# Patient Record
Sex: Male | Born: 1970 | Race: Black or African American | Hispanic: No | Marital: Single | Smoking: Never smoker
Health system: Southern US, Community
[De-identification: ages and names within clinical notes are randomized; demographics above are authoritative.]

## PROBLEM LIST (undated history)

## (undated) DIAGNOSIS — I10 Essential (primary) hypertension: Secondary | ICD-10-CM

## (undated) DIAGNOSIS — M1990 Unspecified osteoarthritis, unspecified site: Secondary | ICD-10-CM

## (undated) DIAGNOSIS — G4730 Sleep apnea, unspecified: Secondary | ICD-10-CM

## (undated) HISTORY — DX: Sleep apnea, unspecified: G47.30

## (undated) HISTORY — DX: Unspecified osteoarthritis, unspecified site: M19.90

## (undated) HISTORY — PX: NO PAST SURGERIES: SHX2092

---

## 2010-11-17 ENCOUNTER — Ambulatory Visit: Payer: Self-pay | Admitting: Family Medicine

## 2010-11-17 DIAGNOSIS — M545 Low back pain, unspecified: Secondary | ICD-10-CM | POA: Insufficient documentation

## 2010-11-17 DIAGNOSIS — M6281 Muscle weakness (generalized): Secondary | ICD-10-CM

## 2010-11-25 ENCOUNTER — Encounter
Admission: RE | Admit: 2010-11-25 | Discharge: 2010-11-25 | Payer: Self-pay | Source: Home / Self Care | Attending: Family Medicine | Admitting: Family Medicine

## 2010-12-04 ENCOUNTER — Telehealth: Payer: Self-pay | Admitting: Family Medicine

## 2010-12-13 ENCOUNTER — Encounter
Admission: RE | Admit: 2010-12-13 | Discharge: 2010-12-13 | Payer: Self-pay | Source: Home / Self Care | Attending: Family Medicine | Admitting: Family Medicine

## 2010-12-13 ENCOUNTER — Encounter
Admission: RE | Admit: 2010-12-13 | Discharge: 2011-01-16 | Payer: Self-pay | Source: Home / Self Care | Attending: Family Medicine | Admitting: Family Medicine

## 2011-01-09 ENCOUNTER — Encounter: Admit: 2011-01-09 | Payer: Self-pay | Admitting: Family Medicine

## 2011-01-16 NOTE — Assessment & Plan Note (Signed)
Summary: NOV: LUmbago   Vital Signs:  Patient profile:   40 year old male Height:      69 inches Weight:      159 pounds Pulse rate:   75 / minute BP sitting:   117 / 79  (right arm) Cuff size:   regular  Vitals Entered By: Avon Gully CMA, Duncan Dull) (November 17, 2010 11:34 AM) CC: NP-back pain    CC:  NP-back pain .  History of Present Illness:   Back pain for 6 months. INitially tx for muscle spams and but on IBU and muscle spasm. Did PT for about 1.5 months and not relief. Mother and brother with MS and he is worried.  No meds for pain relief.  Pain is daily. Pain in the low back.  Now legs are feeling sore, no sharp or shooting pain.  NO trauma 6 months ago.  Did have a normal xray.  Pain is keeping him awake.  no sciatic symptoms.  He does say that when his upper thighs feel sore they also feel weak.  He notes it is difficult to go up steps especially by the end of the day.he has not had any weakness in his upper extremities or lower legs.  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  He completed high school.  He is single.  girlfriend's name is Marcelino Duster Never Smoked Alcohol use-yes Drug use-no Regular exercise-no 3 caffeinated drinks per day Smoking Status:  never Does Patient Exercise:  no STD Risk:  never Drug Use:  no Seat Belt Use:  always  Review of Systems       No fever/sweats/weakness, unexplained weight loss/gain.  No vison changes.  No difficulty hearing/ringing in ears, hay fever/allergies.  No chest pain/discomfort, palpitations.  No Br lump/nipple discharge.  No cough/wheeze.  No blood in BM,  nausea/vomiting/diarrhea.  No nighttime urination, leaking urine, unusual vaginal bleeding, discharge (penis or vagina).  + muscle/joint pain. No rash, change in mole.  No HA, memory loss.  No anxiety, +sleep d/o. No depression.  No easy bruising/bleeding, unexplained lump   Physical Exam  General:  Well-developed,well-nourished,in no acute distress; alert,appropriate and cooperative throughout examination Head:  Normocephalic and atraumatic without obvious abnormalities. No apparent alopecia or balding. Msk:  normal flexion-extension and rotation and side bending at the low back.  He is nontender over the lumbar spine.  No SI joint tenderness or paraspinous tenderness.  Negative straight leg raise bilaterally.  Hip knee and ankle strength are 5/5 bilaterally. Extremities:  no lower extremity edema. Neurologic:  alert & oriented X3, strength normal in all extremities, gait normal, and DTRs symmetrical and normal.     Impression & Recommendations:  Problem # 1:  LUMBAGO (ICD-724.2)  I explained to them as far as his low-back pain is concerned that he has had appropriate care.  He started out with anti-inflammatories and muscle relaxers and home exercises.  He did not improve and so did physical therapy for about 6 weeks and still did not improve.  He has had a normal x-ray of the lumbar spine per his  report.  Since his pain has persisted for the last 6 months I do think we should go ahead and schedule an MRI for further evaluation.  He is also to conservative therapy.  The pain is keeping him awake at nights I did prescribe hydrocodone for bedtime use only.  Clearly he is also worried about multiple sclerosis.  I am also concerned he is having some upper thigh weakness.  We discussed the possibility of getting a consult since he does have a strong family history of a mass.  Will start by getting the MRI of his lumbar spine and then I will call him and discuss the results and future plans. His updated  medication list for this problem includes:    Hydrocodone-acetaminophen 5-500 Mg Tabs (Hydrocodone-acetaminophen) .Marland Kitchen... 1-2 tab at bedtime. can repeat dose in 4 hours.  Orders: T-MRI Lumbar spine w/o contrast (44034)  Complete Medication List: 1)  Hydrocodone-acetaminophen 5-500 Mg Tabs (Hydrocodone-acetaminophen) .Marland Kitchen.. 1-2 tab at bedtime. can repeat dose in 4 hours.  Patient Instructions: 1)  We will call you with the appt for your MRI of your low back. 2)  You can you the hydrocodone at bedtime. It will make you sleep.Call if you don't tolerate it.  Prescriptions: HYDROCODONE-ACETAMINOPHEN 5-500 MG TABS (HYDROCODONE-ACETAMINOPHEN) 1-2 tab at bedtime. Can repeat dose in 4 hours.  #60 x 0   Entered and Authorized by:   Nani Gasser MD   Signed by:   Nani Gasser MD on 11/17/2010   Method used:   Print then Give to Patient   RxID:   [id number]    Orders Added: 1)  New Patient Level III [95188] 2)  T-MRI Lumbar spine w/o contrast [41660]

## 2011-01-18 NOTE — Progress Notes (Signed)
Summary: PT referral  Phone Note Call from Patient   Caller: Patient Call For: Nani Gasser MD Summary of Call: pt called and does want to do physical therapy for his back Initial call taken by: Avon Gully CMA, Duncan Dull),  December 04, 2010 9:21 AM

## 2011-01-18 NOTE — Progress Notes (Signed)
Summary: med  Phone Note Call from Patient   Caller: Patient Call For: Nani Gasser MD Summary of Call: Pt called and states he has muscle spasm in his back during the day and the medication rx'ed at the last visit he takes at night for sleeping. Is there anything he can take for muscle spasm during the day [facility] [place] Initial call taken by: Avon Gully CMA, Duncan Dull),  December 04, 2010 9:51 AM  Follow-up for Phone Call        Can send in MR for daytime. Don't know his pharmacy.  Can be used as needed.  Follow-up by: Nani Gasser MD,  December 04, 2010 9:54 AM  Additional Follow-up for Phone Call Additional follow up Details #1::        [facility] [place] Additional Follow-up by: Avon Gully CMA, Duncan Dull),  December 04, 2010 11:02 AM    New/Updated Medications: SKELAXIN 800 MG TABS (METAXALONE) Take 1 tablet by mouth three times a day as needed muscle spasm Prescriptions: SKELAXIN 800 MG TABS (METAXALONE) Take 1 tablet by mouth three times a day as needed muscle spasm  #60 x 0   Entered by:   Avon Gully CMA, (AAMA)   Authorized by:   Nani Gasser MD   Signed by:   Avon Gully CMA, (AAMA) on 12/04/2010   Method used:   Electronically to        [facility] [address] [phone]* (retail)       [address]       [place], [place]  [address]       Ph: [phone] or [phone]       Fax: [phone]   RxID:   [id number]

## 2011-01-19 ENCOUNTER — Ambulatory Visit: Payer: 59 | Attending: Family Medicine | Admitting: Physical Therapy

## 2011-01-19 ENCOUNTER — Ambulatory Visit: Payer: Self-pay | Admitting: Physical Therapy

## 2011-01-19 DIAGNOSIS — M2560 Stiffness of unspecified joint, not elsewhere classified: Secondary | ICD-10-CM | POA: Insufficient documentation

## 2011-01-19 DIAGNOSIS — M545 Low back pain, unspecified: Secondary | ICD-10-CM | POA: Insufficient documentation

## 2011-01-19 DIAGNOSIS — IMO0001 Reserved for inherently not codable concepts without codable children: Secondary | ICD-10-CM | POA: Insufficient documentation

## 2011-01-19 DIAGNOSIS — R5381 Other malaise: Secondary | ICD-10-CM | POA: Insufficient documentation

## 2011-01-23 ENCOUNTER — Ambulatory Visit: Payer: 59 | Admitting: Physical Therapy

## 2011-01-26 ENCOUNTER — Encounter: Payer: 59 | Admitting: Physical Therapy

## 2011-01-30 ENCOUNTER — Ambulatory Visit (INDEPENDENT_AMBULATORY_CARE_PROVIDER_SITE_OTHER): Payer: 59 | Admitting: Family Medicine

## 2011-01-30 ENCOUNTER — Encounter: Payer: Self-pay | Admitting: Family Medicine

## 2011-01-30 DIAGNOSIS — M545 Low back pain: Secondary | ICD-10-CM

## 2011-01-31 ENCOUNTER — Telehealth: Payer: Self-pay | Admitting: Family Medicine

## 2011-02-07 ENCOUNTER — Encounter: Payer: Self-pay | Admitting: Family Medicine

## 2011-02-07 NOTE — Progress Notes (Signed)
Summary: F/U note  Phone Note Outgoing Call   Summary of Call: [facility] that I spoke with PT down the hall and asked they check you for leg length discrepancy since have one more visit. We are also working on the ortho referral.  Initial call taken by: Nani Gasser MD,  January 31, 2011 8:35 AM

## 2011-02-07 NOTE — Assessment & Plan Note (Signed)
Summary: F/U Lumbago.    Vital Signs:  Patient profile:   40 year old male Height:      69 inches Weight:      161 pounds Pulse rate:   77 / minute BP sitting:   149 / 104  (right arm) Cuff size:   regular  Vitals Entered By: Avon Gully CMA, Duncan Dull) (January 30, 2011 8:34 AM) CC: f/u PT, no improvement   CC:  f/u PT and no improvement.  History of Present Illness: Feels like had pain form hips sot low back. No improvement with PT for about 3 months and they told him to f/u.  Pain is still keeping him awake at tnight. Skelaxin was not helping.  He is still working full time.   Current Medications (verified): 1)  Hydrocodone-Acetaminophen 5-500 Mg Tabs (Hydrocodone-Acetaminophen) .Marland Kitchen.. 1-2 Tab At Bedtime. Can Repeat Dose in 4 Hours. 2)  Skelaxin 800 Mg Tabs (Metaxalone) .... Take 1 Tablet By Mouth Three Times A Day As Needed Muscle Spasm  Allergies (verified): No Known Drug Allergies  Comments:  Nurse/Medical Assistant: The patient's medications and allergies were reviewed with the patient and were updated in the Medication and Allergy Lists. Avon Gully CMA, Duncan Dull) (January 30, 2011 8:35 AM)  Physical Exam  General:  Well-developed,well-nourished,in no acute distress; alert,appropriate and cooperative throughout examination Msk:  Some pain in the right low back with flexion. Pain in the mid back with extension.  Normal rotation right and left. NOrmal side bending. Neg straight leg raise. NOn tender over the lumbar spine, SI joints or paraspinous muscles. Knee, hip, and ankles strength is 5/5.  Extremities:  NO LE edema.    Impression & Recommendations:  Problem # 1:  LUMBAGO (ICD-724.2) No improvement with PT so will refer to ortho.  Normal MRI results. IF lthey do not feel this is MSK then consider referral to Neuro. Trial of neurontin at bedtime to help. Pt ot let me know if helping. Felt the skelexin was not helping. Pain is still keeping him awake at tnight.    The following medications were removed from the medication list:    Skelaxin 800 Mg Tabs (Metaxalone) .Marland Kitchen... Take 1 tablet by mouth three times a day as needed muscle spasm His updated medication list for this problem includes:    Hydrocodone-acetaminophen 5-500 Mg Tabs (Hydrocodone-acetaminophen) .Marland Kitchen... 1-2 tab at bedtime. can repeat dose in 4 hours.  I explained to them as far as his low-back pain is concerned that he has had appropriate care.  He started out with anti-inflammatories and muscle relaxers and home exercises.  He did not improve and so did physical therapy for about 6 weeks and still did not improve.  He has had a normal x-ray of the lumbar spine per his report.  Since his pain has persisted for the last 6 months I do think we should go ahead and schedule an MRI for further evaluation.  He is also to conservative therapy.  The pain is keeping him awake at nights I did prescribe hydrocodone for bedtime use only.  Clearly he is also worried about multiple sclerosis.  I am also concerned he is having some upper thigh weakness.  We discussed the possibility of getting a consult since he does have a strong family history of a mass.  Will start by getting the MRI of his lumbar spine and then I will call him and discuss the results and future plans. His updated medication list for this problem includes:  Hydrocodone-acetaminophen 5-500 Mg Tabs (Hydrocodone-acetaminophen) .Marland Kitchen... 1-2 tab at bedtime. can repeat dose in 4 hours.  Orders: T-MRI Lumbar spine w/o contrast (16109)  Orders: Orthopedic Referral (Ortho)  Complete Medication List: 1)  Hydrocodone-acetaminophen 5-500 Mg Tabs (Hydrocodone-acetaminophen) .Marland Kitchen.. 1-2 tab at bedtime. can repeat dose in 4 hours. 2)  Gabapentin 100 Mg Caps (Gabapentin) .Marland Kitchen.. 1-2 tabs by mouth about one hour before bedtime.  Patient Instructions: 1)  We will call you with the ortho referral.  Prescriptions: GABAPENTIN 100 MG CAPS (GABAPENTIN) 1-2 tabs by  mouth about one hour before bedtime.  #60 x 0   Entered and Authorized by:   Nani Gasser MD   Signed by:   Nani Gasser MD on 01/30/2011   Method used:   [facility]ronically to        [facility] [address] [phone][facility] (retail)       [address]       [place], [place]  [address]       Ph: [phone] or [phone]       Fax: [phone]   RxID:   [id number]    Orders Added: 1)  Orthopedic Referral [Ortho] 2)  Est. Patient Level III [03474]

## 2011-02-16 ENCOUNTER — Encounter: Payer: Self-pay | Admitting: Family Medicine

## 2011-03-06 NOTE — Letter (Signed)
Summary: [facility]  [facility]   Imported By: Maryln Gottron 02/28/2011 08:43:43  _____________________________________________________________________  External Attachment:    Type:   Image     Comment:   External Document

## 2011-03-06 NOTE — Miscellaneous (Signed)
Summary: Discharge Summary for PT/[facility]   Discharge Summary for PT/[facility]   Imported By: Maryln Gottron 02/27/2011 15:10:38  _____________________________________________________________________  External Attachment:    Type:   Image     Comment:   External Document

## 2011-04-19 ENCOUNTER — Telehealth: Payer: Self-pay | Admitting: Family Medicine

## 2011-04-19 NOTE — Telephone Encounter (Signed)
Pt called and left mess for triage nurse that he needs appt in order to get work note.  He had seen specialist for his back and given a medication that caused problems, and out of work X 2 days due to the medication, and now requesting appt with our office so he can get note. Plan:  After speaking with Andrea,CMA we agree that patient will need to go back or call the specialist he had seen for the back problem, and request an note from them since he is under their care for this problem. Plan:  Pt notified and voiced understanding that he will call the back specialist and get work note.  He also will cancel the appt he scheduled with our office for tomorrow.  To be on safe side notified front office to cancel his appt today in order to open extra slot for Friday in case needed by someone else. Gabriel Newcomer, LPN [name]

## 2011-04-20 ENCOUNTER — Ambulatory Visit: Payer: 59 | Admitting: Family Medicine

## 2011-06-01 ENCOUNTER — Encounter: Payer: Self-pay | Admitting: Family Medicine

## 2011-11-26 ENCOUNTER — Ambulatory Visit (INDEPENDENT_AMBULATORY_CARE_PROVIDER_SITE_OTHER): Payer: 59 | Admitting: Family Medicine

## 2011-11-26 ENCOUNTER — Encounter: Payer: Self-pay | Admitting: Family Medicine

## 2011-11-26 VITALS — BP 137/90 | HR 106 | Wt 156.0 lb

## 2011-11-26 DIAGNOSIS — M545 Low back pain: Secondary | ICD-10-CM

## 2011-11-26 DIAGNOSIS — Z23 Encounter for immunization: Secondary | ICD-10-CM

## 2011-11-26 MED ORDER — DICLOFENAC SODIUM 75 MG PO TBEC: 75.0000 mg | DELAYED_RELEASE_TABLET | Freq: Two times a day (BID) | ORAL | Status: DC

## 2011-11-26 MED ORDER — CYCLOBENZAPRINE HCL 10 MG PO TABS: 10.0000 mg | ORAL_TABLET | Freq: Every evening | ORAL | Status: AC | PRN

## 2011-11-26 NOTE — Progress Notes (Signed)
  Subjective:    Patient ID: Gabriel Juarez, male    DOB: 07-Aug-1971, 40 y.o.   MRN: [id number]  HPI  Back Pain has persisted. Had MRI and PT and saw the orthopedist. He thinks was [facility].  Pain is daily, 7/10 most days. Worse with the acitivities. No OTC meds or pain relievers. No radiation of pain from his low back. He has never tried injection. Says soma didn't help but flexeril helped some.   Review of Systems     Objective:   Physical Exam  Constitutional: He appears well-developed and well-nourished.  HENT:  Head: Normocephalic and atraumatic.  Musculoskeletal:       Normal flexion, though right hip sits higher with flexion. Normal extension, rotation right and left. Neg straight leg raise, Hip, knee and ankle strength 5/5 bilat.           Assessment & Plan:  Low BAck Pain - Discussed options.  He has tried PT, seen ortho and had a fairly normal MRI.  At this point I would like him to see sports med. I think he may have a probme with his hips or leg length discrepancy that might be contributing to his pain. Also he has tried electricl stim during PT and founds this helpful so he may be a candidate for a TENS unit for home use. Will also rx flexeril at bedtime for prn use as this does seem to help him. Also given new rx for diclofenac to try. Will call with referral information.

## 2011-11-26 NOTE — Patient Instructions (Signed)
We will call you with the referall.

## 2011-11-26 NOTE — Progress Notes (Signed)
Addended by: Wyline Beady on: 11/26/2011 02:38 PM   Modules accepted: Orders

## 2012-09-30 ENCOUNTER — Encounter: Payer: Self-pay | Admitting: Family Medicine

## 2012-09-30 ENCOUNTER — Telehealth: Payer: Self-pay | Admitting: Family Medicine

## 2012-09-30 ENCOUNTER — Ambulatory Visit (INDEPENDENT_AMBULATORY_CARE_PROVIDER_SITE_OTHER): Payer: Self-pay | Admitting: Family Medicine

## 2012-09-30 VITALS — BP 159/99 | HR 103 | Ht 69.0 in | Wt 163.0 lb

## 2012-09-30 DIAGNOSIS — G8929 Other chronic pain: Secondary | ICD-10-CM

## 2012-09-30 DIAGNOSIS — M2560 Stiffness of unspecified joint, not elsewhere classified: Secondary | ICD-10-CM

## 2012-09-30 DIAGNOSIS — M545 Low back pain, unspecified: Secondary | ICD-10-CM

## 2012-09-30 DIAGNOSIS — Z0000 Encounter for general adult medical examination without abnormal findings: Secondary | ICD-10-CM

## 2012-09-30 LAB — LIPID PANEL
Cholesterol: 220 mg/dL — ABNORMAL HIGH (ref 0–200)
LDL Cholesterol: 78 mg/dL (ref 0–99)
Triglycerides: 68 mg/dL (ref <150)
VLDL: 14 mg/dL (ref 0–40)

## 2012-09-30 LAB — COMPLETE METABOLIC PANEL WITH GFR
ALT: 16 U/L (ref 0–53)
AST: 25 U/L (ref 0–37)
Albumin: 4.8 g/dL (ref 3.5–5.2)
Chloride: 99 mEq/L (ref 96–112)
Creat: 0.99 mg/dL (ref 0.50–1.35)
Potassium: 4.8 mEq/L (ref 3.5–5.3)
Total Protein: 8.1 g/dL (ref 6.0–8.3)

## 2012-09-30 LAB — SEDIMENTATION RATE: Sed Rate: 1 mm/hr (ref 0–16)

## 2012-09-30 LAB — RHEUMATOID FACTOR: Rhuematoid fact SerPl-aCnc: 11 IU/mL (ref <=14)

## 2012-09-30 NOTE — Progress Notes (Signed)
Subjective:    Patient ID: Gabriel Juarez, male    DOB: 26-Jul-1971, 41 y.o.   MRN: [id number]  HPI Here for complete physical today. He has no specific complaints. He is doing well. We reviewed his history. He would like a flu shot today. His tetanus is up-to-date.  Chronic low back pain. He saw sports medicine physician who felt like it was mostly muscle spasms. He did do formal physical therapy but says it really didn't seem to help her make a difference. He complains of stiffness especially when he wakes up in the morning that lasts about an hour and half. He says it to be actually tends to improve his discomfort and stiffness. That he has pain all day long. No other joints that are swollen or painful today.  Review of Systems Comprehensive ROS is neg.    BP 159/99  Pulse 103  Ht 5\' 9"  (1.753 m)  Wt 163 lb (73.936 kg)  BMI 24.07 kg/m2  SpO2 97%    No Known Allergies  History reviewed. No pertinent past medical history.  Past Surgical History  Procedure Date  . No past surgeries     History   Social History  . Marital Status: Single    Spouse Name: N/A    Number of Children: N/A  . Years of Education: N/A   Occupational History  . supervisor Other   Social History Main Topics  . Smoking status: Never Smoker   . Smokeless tobacco: Not on file  . Alcohol Use: No  . Drug Use: Yes  . Sexually Active: Not on file   Other Topics Concern  . Not on file   Social History Narrative  . No narrative on file    Family History  Problem Relation Age of Onset  . Multiple sclerosis Mother   . Multiple sclerosis Brother     Outpatient Encounter Prescriptions as of 09/30/2012  Medication Sig Dispense Refill  . DISCONTD: diclofenac (VOLTAREN) 75 MG EC tablet Take 1 tablet (75 mg total) by mouth 2 (two) times daily.  60 tablet  1          Objective:   Physical Exam  Constitutional: He is oriented to person, place, and time. He appears well-developed and  well-nourished.  HENT:  Head: Normocephalic and atraumatic.  Right Ear: External ear normal.  Left Ear: External ear normal.  Nose: Nose normal.  Mouth/Throat: Oropharynx is clear and moist.       Cyst on right upper eyelid. No cellulitis or erythema.   Eyes: Conjunctivae normal and EOM are normal. Pupils are equal, round, and reactive to light.  Neck: Normal range of motion. Neck supple. No thyromegaly present.  Cardiovascular: Normal rate, regular rhythm, normal heart sounds and intact distal pulses.        No carotid bruits.   Pulmonary/Chest: Effort normal and breath sounds normal.  Abdominal: Soft. Bowel sounds are normal. He exhibits no distension and no mass. There is no tenderness. There is no rebound and no guarding.  Musculoskeletal: Normal range of motion.  Lymphadenopathy:    He has no cervical adenopathy.  Neurological: He is alert and oriented to person, place, and time. He has normal reflexes.  Skin: Skin is warm and dry.  Psychiatric: He has a normal mood and affect. His behavior is normal. Judgment and thought content normal.     Cyst on right upper eye lid.     Assessment & Plan:  complete physical examination Start a regular exercise  program and make sure you are eating a healthy diet Try to eat 4 servings of dairy a day \ Your vaccines are up to date.  Due for CMP and fasting lipid panel. Flu shot given today.  Elevated blood pressure-we'll recheck today in the office. If still elevated have him come back in one to 2 weeks for a repeat check.  Chronic low back pain-would actually like to check him for autoimmune disorders. He had fairly normal x-rays. We'll check a sed rate, ANA, PCP, rheumatoid factor, HLA-B27.  Cyst on his eyelid-I. gave him the phone number for [facility] as surgeons to have it removed. He is Re: tried warm compresses et Karie Soda. Fortunately it does not look infected so this is not emergent.

## 2012-09-30 NOTE — Patient Instructions (Addendum)
Keep up a regular exercise program and make sure you are eating a healthy diet Try to eat 4 servings of dairy a day

## 2012-09-30 NOTE — Telephone Encounter (Signed)
Please call patient and let him know if I can do to make 1-2 weeks for a nurse blood pressure check.

## 2012-10-01 ENCOUNTER — Ambulatory Visit (INDEPENDENT_AMBULATORY_CARE_PROVIDER_SITE_OTHER): Payer: 59 | Admitting: Family Medicine

## 2012-10-01 VITALS — BP 156/99 | HR 88

## 2012-10-01 DIAGNOSIS — I10 Essential (primary) hypertension: Secondary | ICD-10-CM

## 2012-10-01 LAB — ANA: Anti Nuclear Antibody(ANA): NEGATIVE

## 2012-10-01 MED ORDER — LISINOPRIL 20 MG PO TABS: 20.0000 mg | ORAL_TABLET | Freq: Every day | ORAL | Status: DC

## 2012-10-01 NOTE — Progress Notes (Signed)
  Subjective:    Patient ID: Mrk Buzby, male    DOB: October 27, 1971, 41 y.o.   MRN: [id number] Pt denies CP, SOB, dizziness, or heart palpitations. taking meds as directed without problems. Denies med side effects. 5 min spent with pt.  HPI    Review of Systems     Objective:   Physical Exam        Assessment & Plan:  HTN- new diagnosis. Blood pressure still elevated. We'll need to start blood pressure medication. We'll start lisinopril 20 mg daily. It is inexpensive in generic. Followup with me in one month on the blood pressure medications that we can make sure that it's working well. Encouraged low salt diet he may want to go online and check out the DASH diet for ways to help reduce his blood pressure in addition to medication. Nani Gasser, MD

## 2012-10-01 NOTE — Progress Notes (Signed)
Pt informed

## 2012-10-02 ENCOUNTER — Encounter: Payer: Self-pay | Admitting: Family Medicine

## 2012-10-02 NOTE — Telephone Encounter (Signed)
Dr. Linford Arnold checked a complete metabolic panel which includes your liver functions. They were normal.

## 2012-11-26 ENCOUNTER — Other Ambulatory Visit: Payer: Self-pay | Admitting: Family Medicine

## 2013-01-31 ENCOUNTER — Other Ambulatory Visit: Payer: Self-pay

## 2013-04-30 ENCOUNTER — Ambulatory Visit (INDEPENDENT_AMBULATORY_CARE_PROVIDER_SITE_OTHER): Payer: BC Managed Care – PPO | Admitting: Family Medicine

## 2013-04-30 ENCOUNTER — Encounter: Payer: Self-pay | Admitting: Family Medicine

## 2013-04-30 VITALS — BP 113/67 | HR 81 | Ht 67.0 in | Wt 158.0 lb

## 2013-04-30 DIAGNOSIS — I10 Essential (primary) hypertension: Secondary | ICD-10-CM

## 2013-04-30 DIAGNOSIS — G5600 Carpal tunnel syndrome, unspecified upper limb: Secondary | ICD-10-CM

## 2013-04-30 DIAGNOSIS — M545 Low back pain: Secondary | ICD-10-CM

## 2013-04-30 MED ORDER — MELOXICAM 7.5 MG PO TABS: 7.5000 mg | ORAL_TABLET | Freq: Every day | ORAL | Status: DC

## 2013-04-30 MED ORDER — LISINOPRIL 20 MG PO TABS: 20.0000 mg | ORAL_TABLET | Freq: Every day | ORAL | Status: DC

## 2013-04-30 MED ORDER — HYDROCODONE-ACETAMINOPHEN 5-325 MG PO TABS: 1.0000 | ORAL_TABLET | Freq: Every evening | ORAL | Status: DC | PRN

## 2013-04-30 MED ORDER — HYDROCODONE-ACETAMINOPHEN 5-325 MG PO TABS: 1.0000 | ORAL_TABLET | ORAL | Status: DC | PRN

## 2013-04-30 NOTE — Progress Notes (Signed)
Subjective:    Patient ID: Gabriel Juarez, male    DOB: 1971-02-24, 42 y.o.   MRN: [id number]  HPI Low back pain for years but has been worse the last 2 months.  Say has to fall alseep with hand on hip to make it feel better. Say the pain will wake him up at night. Has had xrays, MRI and PT without sig relief.  Had a massage last week with no relief.  Using Aleve for pain relief - not really help you. Muscle relaxer don't really help.   Hand numbness - x 2 months.  Says will feels hands go numb up to his elbows. Says it affects the entire hand not so certain fingers. Can happen when driving a short distance like 10 miles, while gripping the steering wheel.  Says some repetative activities but not a lot.  Hands will go numb at night.  No neck pain. He says it happens usually just moves her shakes his arms out and eventually gets better. No other alleviating symptoms. He has used Aleve but hasn't made a big difference. He denies any known trauma or injury. 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  HTN-  Pt denies chest pain, SOB, dizziness, or heart palpitations.  Taking meds as directed w/o problems.  Denies medication side effects.     Review of Systems     Objective:   Physical Exam  Constitutional: He is oriented to person, place, and time. He appears well-developed and well-nourished.  HENT:  Head: Normocephalic and atraumatic.  Cardiovascular: Normal rate, regular rhythm and normal heart sounds.   Pulmonary/Chest: Effort normal and breath sounds normal.  Musculoskeletal:  Neck with normal range of motion. Shoulders with normal range of motion. Shoulder, elbows, wrists and fingers with normal strength. Positive Tinel's and Phalen's sign bilaterally. Finger strength is normal. Grip strength is normal. Nontender over the elbows. No pain with pronation or supination. Tender over the left lumbar spine  paraspinous muscles are mildly tender just below the SI joint. I do feel like he has some very minor leg length discrepancy. I think the left is shorter than the right. Patellar reflexes are 2+ bilaterally. Negative straight leg is bilaterally. Hip, knee, ankle strength 5 out of 5 bilaterally.  Neurological: He is alert and oriented to person, place, and time.  Skin: Skin is warm and dry.  Psychiatric: He has a normal mood and affect. His behavior is normal.          Assessment & Plan:  Low Back pain- left.  He has had fairly normal x-rays as well as MRI. He did have some evidence of muscular skeletal spasm. He is also completed physical therapy without significant relief he is also tried massage therapy. At this point I think this could be mechanical. I would like him to see my partner Dr. Rodney Langton for further evaluation to look at things such as possible likely discrepancy or maybe even orthotics I could help take some pain and pressure off of his back. Continue with regular exercises. Given a prescription for Mobic anti-inflammatory and given a small quantity of hydrocodone to use at bedtime since it is keeping him awake at night. Please see MRI report from 2011.  Carpel tunnel based on his exam today I do think that he has bilateral carpal tunnel syndrome. We'll treat with bilateral cockup splints. He is to wear them at  night. It over the next 3 weeks is not noticing significant improvement in his discomfort pain and hand numbness and he is to please followup and let me know. Otherwise if he is improving significantly then he can start to decrease use of the splints. Also if she's going to do any heavy repetitive activity he can certainly slip the splint on for a short period of time during that activity if needed.  Hypertension-well-controlled. Medication refill sent to pharmacy.  F/U in 6 months.

## 2013-04-30 NOTE — Patient Instructions (Addendum)

## 2013-05-07 ENCOUNTER — Institutional Professional Consult (permissible substitution): Payer: BC Managed Care – PPO | Admitting: Sports Medicine

## 2013-05-14 ENCOUNTER — Encounter: Payer: Self-pay | Admitting: Sports Medicine

## 2013-05-14 ENCOUNTER — Ambulatory Visit (INDEPENDENT_AMBULATORY_CARE_PROVIDER_SITE_OTHER): Payer: BC Managed Care – PPO | Admitting: Sports Medicine

## 2013-05-14 VITALS — BP 126/76 | HR 96 | Wt 159.0 lb

## 2013-05-14 DIAGNOSIS — M545 Low back pain, unspecified: Secondary | ICD-10-CM

## 2013-05-14 DIAGNOSIS — IMO0001 Reserved for inherently not codable concepts without codable children: Secondary | ICD-10-CM

## 2013-05-14 MED ORDER — MELOXICAM 15 MG PO TABS: ORAL_TABLET | ORAL | Status: DC

## 2013-05-14 MED ORDER — GABAPENTIN 300 MG PO CAPS: ORAL_CAPSULE | ORAL | Status: DC

## 2013-05-14 MED ORDER — PREDNISONE 50 MG PO TABS: ORAL_TABLET | ORAL | Status: DC

## 2013-05-14 NOTE — Progress Notes (Signed)
   Subjective:    I'm seeing this patient as a consultation for:  Dr. Linford Arnold  CC: Low back pain  HPI: This is a very pleasant 42 year old male who works walking his feet all day, comes in with a long history of pain he localizes in the left side of his low back. He has been through formal physical therapy, has had an MRI which was negative, has been on multiple muscle relaxants including Flexeril, without any improvement. He's never had steroids, and has never had any interventional treatment. Pain radiates down into the left buttock and mid thigh posteriorly, but not past the knee. It is positional and worse with standing but also bad when sitting in a car for a long period of time. Denies any bowel or bladder changes pain is moderate, stable.  Past medical history, Surgical history, Family history not pertinant except as noted below, Social history, Allergies, and medications have been entered into the medical record, reviewed, and no changes needed.   Review of Systems: No headache, visual changes, nausea, vomiting, diarrhea, constipation, dizziness, abdominal pain, skin rash, fevers, chills, night sweats, weight loss, swollen lymph nodes, body aches, joint swelling, muscle aches, chest pain, shortness of breath, mood changes, visual or auditory hallucinations.   Objective:   General: Well Developed, well nourished, and in no acute distress.  Neuro/Psych: Alert and oriented x3, extra-ocular muscles intact, able to move all 4 extremities, sensation grossly intact. Skin: Warm and dry, no rashes noted.  Respiratory: Not using accessory muscles, speaking in full sentences, trachea midline.  Cardiovascular: Pulses palpable, no extremity edema. Abdomen: Does not appear distended. Back Exam:  Inspection: Unremarkable  Motion: Flexion 45 deg, Extension 45 deg, Side Bending to 45 deg bilaterally,  Rotation to 45 deg bilaterally  SLR laying: Negative  XSLR laying: Negative  Palpable tenderness:  Left sacroiliac joint and posterior superior iliac spine. FABER: Positive on the left side. Sensory change: Gross sensation intact to all lumbar and sacral dermatomes.  Reflexes: 2+ at both patellar tendons, 2+ at achilles tendons, Babinski's downgoing.  Strength at foot  Plantar-flexion: 5/5 Dorsi-flexion: 5/5 Eversion: 5/5 Inversion: 5/5  Leg strength  Quad: 5/5 Hamstring: 5/5 Hip flexor: 5/5 Hip abductors: 5/5  Gait unremarkable. Leg lengths are equal.  MRI was reviewed, and there is no significant degenerative disc disease, facet spondylosis.  Procedure:  Injection of 3 left-sided paraspinal trigger points. Consent obtained and verified. Time-out conducted. Noted no overlying erythema, induration, or other signs of local infection. Skin prepped in a sterile fashion. Topical analgesic spray: Ethyl chloride. Completed without difficulty. Meds: A total of 1 cc Kenalog 40, 5 cc lidocaine was spread out in a fanlike pattern between the 3 trigger points on the left erector spinae as well as the quadratus lumborum on the left side. Pain immediately improved suggesting accurate placement of the medication. Advised to call if fevers/chills, erythema, induration, drainage, or persistent bleeding Impression and Recommendations:   This case required medical decision making of moderate complexity.

## 2013-05-14 NOTE — Assessment & Plan Note (Addendum)
There certainly paraspinal muscle spasm on the left side, 3 trigger point injections performed today into the muscle. He does have a positive Patrick's test and pain at the posterior superior iliac spine which is suggestive of left-sided sacroiliac joint dysfunction. Leg lengths were equal. Home exercises, Mobic, prednisone, gabapentin a taper. Return in 4 weeks, I will block the SI joint under guidance if no better.

## 2013-06-04 ENCOUNTER — Encounter: Payer: Self-pay | Admitting: Family Medicine

## 2013-06-05 MED ORDER — WITCH HAZEL-GLYCERIN EX PADS: MEDICATED_PAD | CUTANEOUS | Status: DC | PRN

## 2013-06-05 MED ORDER — HYDROCORTISONE ACETATE 25 MG RE SUPP: 25.0000 mg | Freq: Two times a day (BID) | RECTAL | Status: DC | PRN

## 2013-06-05 MED ORDER — DOCUSATE SODIUM 100 MG PO CAPS: 100.0000 mg | ORAL_CAPSULE | Freq: Three times a day (TID) | ORAL | Status: DC | PRN

## 2013-06-12 ENCOUNTER — Ambulatory Visit: Payer: BC Managed Care – PPO | Admitting: Sports Medicine

## 2013-06-18 ENCOUNTER — Encounter: Payer: Self-pay | Admitting: Sports Medicine

## 2013-06-18 ENCOUNTER — Ambulatory Visit (INDEPENDENT_AMBULATORY_CARE_PROVIDER_SITE_OTHER): Payer: Self-pay | Admitting: Sports Medicine

## 2013-06-18 VITALS — BP 110/74 | HR 88 | Wt 159.0 lb

## 2013-06-18 DIAGNOSIS — IMO0001 Reserved for inherently not codable concepts without codable children: Secondary | ICD-10-CM

## 2013-06-18 DIAGNOSIS — M545 Low back pain, unspecified: Secondary | ICD-10-CM

## 2013-06-18 NOTE — Assessment & Plan Note (Addendum)
Pain has now moved to the right SI joint. Injection of the right SI joint as above. Pain did not completely resolve suggesting that the SI joint may not be the pain generating structure. If no better certainly consider trying to inject the left SI joint. I do now suspect that this is more myofascial, especially with a negative MRI, and we will need to use predominantly medication management. Return in 3 weeks.

## 2013-06-18 NOTE — Progress Notes (Signed)
  Subjective:    CC: Followup  HPI: This is a very pleasant 42 year old male with persistent low back pain that is localized at the left SI joint last visit, but notes that it has moved to the right SI joint as well as right quadratus lumborum today. The pain was so bad he had to miss work yesterday and leave work early today. He has had an MRI of his lumbar spine which was completely negative. In the past he had a positive Patrick's test that was suggestive of SI joint pathology, I did some trigger point injections, and placed to pursue conservative measures. He returns today no better. Pain is the worse with sitting for long periods of time, and with walking on hard surfaces. Pain radiates to the posterior thigh and buttock not past the knee.  Past medical history, Surgical history, Family history not pertinant except as noted below, Social history, Allergies, and medications have been entered into the medical record, reviewed, and no changes needed.   Review of Systems: No fevers, chills, night sweats, weight loss, chest pain, or shortness of breath.   Objective:    General: Well Developed, well nourished, and in no acute distress.  Neuro: Alert and oriented x3, extra-ocular muscles intact, sensation grossly intact.  HEENT: Normocephalic, atraumatic, pupils equal round reactive to light, neck supple, no masses, no lymphadenopathy, thyroid nonpalpable.  Skin: Warm and dry, no rashes. Cardiac: Regular rate and rhythm, no murmurs rubs or gallops, no lower extremity edema.  Respiratory: Clear to auscultation bilaterally. Not using accessory muscles, speaking in full sentences. Back Exam:  Inspection: Unremarkable  Motion: Flexion 45 deg, Extension 45 deg, Side Bending to 45 deg bilaterally,  Rotation to 45 deg bilaterally  SLR laying: Negative  XSLR laying: Negative  Palpable tenderness: Right SI joint. Also tender in the right quadratus lumborum. FABER: Positive with reproduction of pain  into the right SI joint. Sensory change: Gross sensation intact to all lumbar and sacral dermatomes.  Reflexes: 2+ at both patellar tendons, 2+ at achilles tendons, Babinski's downgoing.  Strength at foot  Plantar-flexion: 5/5 Dorsi-flexion: 5/5 Eversion: 5/5 Inversion: 5/5  Leg strength  Quad: 5/5 Hamstring: 5/5 Hip flexor: 5/5 Hip abductors: 5/5  Gait unremarkable.  Procedure: Real-time Ultrasound Guided Injection of right SI joint Device: GE Logiq E  Verbal informed consent obtained.  Time-out conducted.  Noted no overlying erythema, induration, or other signs of local infection.  Skin prepped in a sterile fashion.  Local anesthesia: Topical Ethyl chloride.  With sterile technique and under real time ultrasound guidance:  Spinal needle advanced just medial to the posterior superior iliac spine skimming over the sacrum. 1 cc Kenalog 40, 4 cc lidocaine injected easily. Completed without difficulty  Patient did not report pain relief after this injection. Advised to call if fevers/chills, erythema, induration, drainage, or persistent bleeding.  Images permanently stored and available for review in the ultrasound unit.  Impression: Technically successful ultrasound guided injection.  Impression and Recommendations:

## 2013-07-09 ENCOUNTER — Ambulatory Visit (INDEPENDENT_AMBULATORY_CARE_PROVIDER_SITE_OTHER): Payer: Self-pay | Admitting: Sports Medicine

## 2013-07-09 ENCOUNTER — Encounter: Payer: Self-pay | Admitting: Sports Medicine

## 2013-07-09 VITALS — BP 128/81 | HR 75 | Wt 158.0 lb

## 2013-07-09 DIAGNOSIS — M545 Low back pain, unspecified: Secondary | ICD-10-CM

## 2013-07-09 MED ORDER — TRAMADOL HCL 50 MG PO TABS: 50.0000 mg | ORAL_TABLET | Freq: Three times a day (TID) | ORAL | Status: DC | PRN

## 2013-07-09 NOTE — Assessment & Plan Note (Signed)
Pain has now resolved after a single right-sided SI joint injection. Return an as-needed basis. I am adding tramadol for any breakthrough pain she has, and he can certainly return for another SI joint injection if needed.

## 2013-07-09 NOTE — Progress Notes (Signed)
  Subjective:    CC: Followup  HPI: Low back pain: Vint has been to formal physical therapy, NSAIDs, steroids, muscle relaxers, he had an MRI that was negative, I performed a right sided SI joint injection at the last visit and he returns today essentially pain-free.  Past medical history, Surgical history, Family history not pertinant except as noted below, Social history, Allergies, and medications have been entered into the medical record, reviewed, and no changes needed.   Review of Systems: No fevers, chills, night sweats, weight loss, chest pain, or shortness of breath.   Objective:    General: Well Developed, well nourished, and in no acute distress.  Neuro: Alert and oriented x3, extra-ocular muscles intact, sensation grossly intact.  HEENT: Normocephalic, atraumatic, pupils equal round reactive to light, neck supple, no masses, no lymphadenopathy, thyroid nonpalpable.  Skin: Warm and dry, no rashes. Cardiac: Regular rate and rhythm, no murmurs rubs or gallops, no lower extremity edema.  Respiratory: Clear to auscultation bilaterally. Not using accessory muscles, speaking in full sentences. Back Exam:  Inspection: Unremarkable  Motion: Flexion 45 deg, Extension 45 deg, Side Bending to 45 deg bilaterally,  Rotation to 45 deg bilaterally  SLR laying: Negative  XSLR laying: Negative  Palpable tenderness: None. FABER: negative. Sensory change: Gross sensation intact to all lumbar and sacral dermatomes.  Reflexes: 2+ at both patellar tendons, 2+ at achilles tendons, Babinski's downgoing.  Strength at foot  Plantar-flexion: 5/5 Dorsi-flexion: 5/5 Eversion: 5/5 Inversion: 5/5  Leg strength  Quad: 5/5 Hamstring: 5/5 Hip flexor: 5/5 Hip abductors: 5/5  Gait unremarkable  Impression and Recommendations:

## 2013-07-28 ENCOUNTER — Other Ambulatory Visit: Payer: Self-pay | Admitting: Family Medicine

## 2013-08-13 ENCOUNTER — Ambulatory Visit (INDEPENDENT_AMBULATORY_CARE_PROVIDER_SITE_OTHER): Payer: Self-pay | Admitting: Sports Medicine

## 2013-08-13 ENCOUNTER — Encounter: Payer: Self-pay | Admitting: Sports Medicine

## 2013-08-13 VITALS — BP 123/83 | HR 92 | Wt 155.0 lb

## 2013-08-13 DIAGNOSIS — M545 Low back pain, unspecified: Secondary | ICD-10-CM

## 2013-08-13 MED ORDER — HYDROCODONE-ACETAMINOPHEN 5-325 MG PO TABS: 1.0000 | ORAL_TABLET | Freq: Three times a day (TID) | ORAL | Status: DC | PRN

## 2013-08-13 NOTE — Progress Notes (Signed)
  Subjective:    CC: Followup  HPI: This is a very pleasant 42 year old male, he's had axial low back pain for some time, he's been to physical therapy, steroids, muscle relaxers, NSAIDs, MRI was negative, I have performed a left and right ultrasound guided sacroiliac joint block, both the left and the right blocks were effective for about 2 months he unfortunately pain has returned, his tramadol is not effective enough. Pain is localized, doesn't radiate, moderate.  Past medical history, Surgical history, Family history not pertinant except as noted below, Social history, Allergies, and medications have been entered into the medical record, reviewed, and no changes needed.   Review of Systems: No fevers, chills, night sweats, weight loss, chest pain, or shortness of breath.   Objective:    General: Well Developed, well nourished, and in no acute distress.  Neuro: Alert and oriented x3, extra-ocular muscles intact, sensation grossly intact.  HEENT: Normocephalic, atraumatic, pupils equal round reactive to light, neck supple, no masses, no lymphadenopathy, thyroid nonpalpable.  Skin: Warm and dry, no rashes. Cardiac: Regular rate and rhythm, no murmurs rubs or gallops, no lower extremity edema.  Respiratory: Clear to auscultation bilaterally. Not using accessory muscles, speaking in full sentences. Back Exam:  Inspection: Unremarkable  Motion: Flexion 45 deg, Extension 45 deg, Side Bending to 45 deg bilaterally,  Rotation to 45 deg bilaterally  SLR laying: Negative  XSLR laying: Negative  Palpable tenderness: Bilateral sacroiliac joint.Marland Kitchen FABER: negative. Sensory change: Gross sensation intact to all lumbar and sacral dermatomes.  Reflexes: 2+ at both patellar tendons, 2+ at achilles tendons, Babinski's downgoing.  Strength at foot  Plantar-flexion: 5/5 Dorsi-flexion: 5/5 Eversion: 5/5 Inversion: 5/5  Leg strength  Quad: 5/5 Hamstring: 5/5 Hip flexor: 5/5 Hip abductors: 5/5  Gait  unremarkable. Impression and Recommendations:

## 2013-08-13 NOTE — Assessment & Plan Note (Signed)
I performed two ultrasound guided sacroiliac joint blocks, he has had an excellent response to both, left better than right. Unfortunately, response only lasted about 2 months. I would like to set him up for consideration of radiofrequency ablation. I would like him to see Dr. Regino Schultze with [facility]. Tramadol is ineffective, increasing to hydrocodone for pain.

## 2013-08-17 ENCOUNTER — Encounter: Payer: Self-pay | Admitting: Family Medicine

## 2013-08-27 ENCOUNTER — Encounter: Payer: Self-pay | Admitting: Family Medicine

## 2013-09-03 ENCOUNTER — Other Ambulatory Visit: Payer: Self-pay | Admitting: Family Medicine

## 2013-09-06 ENCOUNTER — Other Ambulatory Visit: Payer: Self-pay | Admitting: Family Medicine

## 2013-09-06 MED ORDER — DULOXETINE HCL 30 MG PO CPEP: 30.0000 mg | ORAL_CAPSULE | Freq: Every day | ORAL | Status: DC

## 2013-09-10 ENCOUNTER — Ambulatory Visit: Payer: BC Managed Care – PPO | Admitting: Sports Medicine

## 2013-09-10 DIAGNOSIS — Z0289 Encounter for other administrative examinations: Secondary | ICD-10-CM

## 2013-09-18 ENCOUNTER — Encounter: Payer: Self-pay | Admitting: Family Medicine

## 2013-09-18 ENCOUNTER — Ambulatory Visit (INDEPENDENT_AMBULATORY_CARE_PROVIDER_SITE_OTHER): Payer: Self-pay | Admitting: Family Medicine

## 2013-09-18 VITALS — BP 126/80 | HR 100 | Wt 160.0 lb

## 2013-09-18 DIAGNOSIS — M778 Other enthesopathies, not elsewhere classified: Secondary | ICD-10-CM

## 2013-09-18 DIAGNOSIS — G5632 Lesion of radial nerve, left upper limb: Secondary | ICD-10-CM

## 2013-09-18 DIAGNOSIS — Z23 Encounter for immunization: Secondary | ICD-10-CM

## 2013-09-18 DIAGNOSIS — G5630 Lesion of radial nerve, unspecified upper limb: Secondary | ICD-10-CM

## 2013-09-18 MED ORDER — PREDNISONE 50 MG PO TABS: 50.0000 mg | ORAL_TABLET | Freq: Every day | ORAL | Status: DC

## 2013-09-18 NOTE — Progress Notes (Signed)
Subjective:    Patient ID: Gabriel Juarez, male    DOB: 04-06-1971, 42 y.o.   MRN: [id number]  HPI Left elbow pain swollen and sore. Started about 10 days ago and felt sore but by the weekend he felt better and then when went back to work it got worse and got really swollen.  Has missed 2 days of work.  Would like a work note.   Pain radiating into shoulder and causing fingers to go numb.  been using ice , heat, and ibuprofen with no results. Has been using tennis elbow strap.  He does do a lot of reaching above his head for work. 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     Review of Systems BP 126/80  Pulse 100  Wt 160 lb (72.576 kg)  BMI 25.05 kg/m2    No Known Allergies  No past medical history on file.  Past Surgical History  Procedure Laterality Date  . No past surgeries      History   Social History  . Marital Status: Single    Spouse Name: N/A    Number of Children: N/A  . Years of Education: N/A   Occupational History  . supervisor Other   Social History Main Topics  . Smoking status: Never Smoker   . Smokeless tobacco: Not on file  . Alcohol Use: No  . Drug Use: Yes  . Sexual Activity: Not on file   Other Topics Concern  . Not on file   Social History Narrative  . No narrative on file    Family History  Problem Relation Age of Onset  . Multiple sclerosis Mother   . Multiple sclerosis Brother     Outpatient Encounter Prescriptions as of 09/18/2013  Medication Sig Dispense Refill  . DULoxetine (CYMBALTA) 30 MG capsule Take 1 capsule (30 mg total) by mouth daily. Generic please  30 capsule  0  . gabapentin (NEURONTIN) 300 MG capsule One tab PO qHS for a week, then BID for a week, then TID.  May increase to a max of 4 tabs PO TID.  180 capsule  3  . HYDROcodone-acetaminophen (NORCO/VICODIN) 5-325 MG per tablet Take 1 tablet by mouth every 8 (eight) hours as needed for pain.  40 tablet  0  . lisinopril (PRINIVIL,ZESTRIL) 20 MG tablet Take 1 tablet  (20 mg total) by mouth daily.  30 tablet  5  . traMADol (ULTRAM) 50 MG tablet Take 1 tablet (50 mg total) by mouth every 8 (eight) hours as needed for pain.  50 tablet  2  . predniSONE (DELTASONE) 50 MG tablet Take 1 tablet (50 mg total) by mouth daily.  5 tablet  0   No facility-administered encounter medications on file as of 09/18/2013.          Objective:   Physical Exam  Constitutional: He is oriented to person, place, and time. He appears well-developed and well-nourished.  HENT:  Head: Normocephalic and atraumatic.  Musculoskeletal:  Left elbow with normal flexion and extension. He does have some swelling over the triceps insertion area. He also has some tenderness over the upper lateral outer arm where the radial nerve exits. He's also having numbness rating around the outer part of the elbow down to the back of the hand. He does have decreased strength in his fingers in the left hand. He has a little bit of weakness and significant discomfort with extension of the triceps. Normal strength with biceps flexion.  Neurological: He  is alert and oriented to person, place, and time.  Skin: Skin is warm and dry.  Psychiatric: He has a normal mood and affect. His behavior is normal.          Assessment & Plan:  Triceps tendinitis-continue to ice the area as needed. Gentle stretches back and forth. Given prednisone 50 mg x 5 days.   Radial nerve entrapment-recommend rest. Burst of prednisone for 5 days. Stop all anti-inflammatories during this time. She took prednisone from water and stop immediately if any GI upset or irritation. After that he can restart the ibuprofen. Followup with my partner Dr. Rodney Langton in one week if his symptoms persist.

## 2013-09-19 ENCOUNTER — Encounter: Payer: Self-pay | Admitting: Family Medicine

## 2013-09-21 NOTE — Telephone Encounter (Signed)
routed

## 2013-09-25 ENCOUNTER — Ambulatory Visit: Payer: BC Managed Care – PPO | Admitting: Family Medicine

## 2013-09-29 ENCOUNTER — Ambulatory Visit: Payer: Self-pay | Admitting: Sports Medicine

## 2013-10-14 ENCOUNTER — Encounter: Payer: Self-pay | Admitting: Family Medicine

## 2013-10-16 ENCOUNTER — Other Ambulatory Visit: Payer: Self-pay | Admitting: *Deleted

## 2013-10-16 MED ORDER — LISINOPRIL 20 MG PO TABS: 20.0000 mg | ORAL_TABLET | Freq: Every day | ORAL | Status: DC

## 2013-10-27 ENCOUNTER — Other Ambulatory Visit: Payer: Self-pay | Admitting: Family Medicine

## 2013-10-27 NOTE — Telephone Encounter (Signed)
Please schedule a f/u appt for hypertension before future refills

## 2013-11-17 ENCOUNTER — Emergency Department (HOSPITAL_BASED_OUTPATIENT_CLINIC_OR_DEPARTMENT_OTHER)
Admission: EM | Admit: 2013-11-17 | Discharge: 2013-11-17 | Disposition: A | Discharge status: Home / Self Care | Payer: Self-pay | Attending: Emergency Medicine | Admitting: Emergency Medicine

## 2013-11-17 ENCOUNTER — Encounter (HOSPITAL_BASED_OUTPATIENT_CLINIC_OR_DEPARTMENT_OTHER): Payer: Self-pay | Admitting: Emergency Medicine

## 2013-11-17 DIAGNOSIS — G5632 Lesion of radial nerve, left upper limb: Secondary | ICD-10-CM

## 2013-11-17 DIAGNOSIS — M545 Low back pain: Secondary | ICD-10-CM

## 2013-11-17 DIAGNOSIS — IMO0002 Reserved for concepts with insufficient information to code with codable children: Secondary | ICD-10-CM | POA: Insufficient documentation

## 2013-11-17 DIAGNOSIS — Z79899 Other long term (current) drug therapy: Secondary | ICD-10-CM | POA: Insufficient documentation

## 2013-11-17 DIAGNOSIS — G5630 Lesion of radial nerve, unspecified upper limb: Secondary | ICD-10-CM | POA: Insufficient documentation

## 2013-11-17 MED ORDER — PREDNISONE 50 MG PO TABS: 50.0000 mg | ORAL_TABLET | Freq: Every day | ORAL | Status: DC

## 2013-11-17 MED ORDER — HYDROCODONE-ACETAMINOPHEN 5-325 MG PO TABS: 1.0000 | ORAL_TABLET | Freq: Four times a day (QID) | ORAL | Status: DC | PRN

## 2013-11-17 NOTE — ED Provider Notes (Signed)
CSN: [id number]     Arrival date & time 11/17/13  0905 History   First MD Initiated Contact with Patient 11/17/13 [phone]     Chief Complaint  Patient presents with  . 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 Has not been to hand or neuro for further evaluation yet. No acute injury.   History reviewed. No pertinent past medical history. Past Surgical History  Procedure Laterality Date  . No past surgeries     Family History  Problem Relation Age of Onset  . Multiple sclerosis Mother   . Multiple sclerosis Brother    History  Substance Use Topics  . Smoking status: Never Smoker   . Smokeless tobacco: Not on file  . Alcohol Use: No    Review of Systems All other systems reviewed and are negative except as noted in HPI.   Allergies  Review of patient's allergies indicates no known allergies.  Home Medications   Current Outpatient Rx  Name  Route  Sig  Dispense  Refill  . DULoxetine (CYMBALTA) 30 MG capsule   Oral   Take 1 capsule (30 mg total) by mouth daily. Generic please   30 capsule   0   . gabapentin (NEURONTIN) 300 MG capsule      One tab PO qHS for a week, then BID for a week, then TID.  May increase to a max of 4 tabs PO TID.   180 capsule   3   . HYDROcodone-acetaminophen (NORCO/VICODIN) 5-325 MG per tablet   Oral   Take 1 tablet by mouth every 8 (eight) hours as needed for pain.   40 tablet   0   . lisinopril (PRINIVIL,ZESTRIL) 20 MG tablet   Oral   Take 1 tablet (20 mg total) by mouth  daily.   30 tablet   0   . lisinopril (PRINIVIL,ZESTRIL) 20 MG tablet      TAKE 1 TABLET (20 MG TOTAL) BY MOUTH DAILY.   30 tablet   0     Please schedule a f/u appt for hypertension before ...   . predniSONE (DELTASONE) 50 MG tablet   Oral   Take 1 tablet (50 mg total) by mouth daily.   5 tablet   0   . traMADol (ULTRAM) 50 MG tablet   Oral   Take 1 tablet (50 mg total) by mouth every 8 (eight) hours as needed for pain.   50 tablet   2    BP 143/87  Pulse 87  Temp(Src) 99 F (37.2 C) (Oral)  Resp 16  Ht 5\' 7"  (1.702 m)  Wt 165 lb (74.844 kg)  BMI 25.84 kg/m2  SpO2 100% Physical Exam  Nursing note and vitals reviewed. Constitutional: He is oriented to person, place, and time. He appears well-developed and well-nourished.  HENT:  Head: Normocephalic and atraumatic.  Eyes: EOM are normal. Pupils are equal, round, and reactive to light.  Neck: Normal range of motion. Neck supple.  Cardiovascular: Normal rate, normal heart sounds and intact distal pulses.   Pulmonary/Chest: Effort  normal and breath sounds normal.  Abdominal: Bowel sounds are normal. He exhibits no distension. There is no tenderness.  Musculoskeletal: Normal range of motion. He exhibits no edema and no tenderness.  Neurological: He is alert and oriented to person, place, and time. No cranial nerve deficit.  Paresthesia L hand, ? Decreased grip strength on L hand  Skin: Skin is warm and dry. No rash noted.  Psychiatric: He has a normal mood and affect.    ED Course  Procedures (including critical care time) Labs Review Labs Reviewed - No data to display Imaging Review No results found.  EKG Interpretation   None       MDM   1. Radial nerve entrapment, left     Pt without a clear dermatomal distribution of his numbness, suspect nerve entrapment but recommend follow up with Neuro for nerve conduction studies. May also benefit from Hand referral for further eval if NCS is abnormal. In the  meantime, short course of steroids, rest, and PCP followup if not improving.     Fedora Knisely B. Bernette Mayers, MD 11/17/13 [phone]

## 2013-11-17 NOTE — ED Notes (Signed)
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

## 2013-11-17 NOTE — ED Notes (Signed)
MD at bedside. 

## 2013-11-30 ENCOUNTER — Encounter: Payer: Self-pay | Admitting: Family Medicine

## 2013-11-30 ENCOUNTER — Other Ambulatory Visit: Payer: Self-pay | Admitting: Family Medicine

## 2013-12-01 ENCOUNTER — Other Ambulatory Visit: Payer: Self-pay | Admitting: Family Medicine

## 2013-12-01 MED ORDER — AMLODIPINE BESYLATE 5 MG PO TABS: 5.0000 mg | ORAL_TABLET | Freq: Every day | ORAL | Status: DC

## 2014-01-01 ENCOUNTER — Ambulatory Visit (INDEPENDENT_AMBULATORY_CARE_PROVIDER_SITE_OTHER): Payer: BC Managed Care – PPO | Admitting: Family Medicine

## 2014-01-01 ENCOUNTER — Encounter: Payer: Self-pay | Admitting: Family Medicine

## 2014-01-01 VITALS — BP 151/91 | HR 84 | Temp 97.9°F | Ht 67.0 in | Wt 147.0 lb

## 2014-01-01 DIAGNOSIS — F430 Acute stress reaction: Secondary | ICD-10-CM

## 2014-01-01 DIAGNOSIS — G4700 Insomnia, unspecified: Secondary | ICD-10-CM

## 2014-01-01 DIAGNOSIS — I10 Essential (primary) hypertension: Secondary | ICD-10-CM

## 2014-01-01 MED ORDER — AMLODIPINE BESYLATE 5 MG PO TABS: 5.0000 mg | ORAL_TABLET | Freq: Every day | ORAL | Status: DC

## 2014-01-01 MED ORDER — PAROXETINE HCL 40 MG PO TABS: ORAL_TABLET | ORAL | Status: DC

## 2014-01-01 NOTE — Progress Notes (Signed)
Subjective:    Patient ID: Gabriel PellegriniKenneth Juarez, male    DOB: 08/21/1971, 43 y.o.   MRN: [id number]  HPI Hypertension- Pt denies chest pain, SOB, dizziness, or heart palpitations.  He says he quit taking his lisinopril after about 3 months. He said initially he was fine and did not have any problems. The he began feeling short of breath after taking the medication such as stopped it on his own. He says the shortness of breath resolved after discontinuing the medication. He did not call us and let us know. Today he's coming in because he says he knows he needs to get on some other type of blood pressure agent. At one point in time we called in a prescription for amlodipine but he says he was out of town at the time and never picked it up.  Insomnia - reports that he is going through a lot of personal issues right now. His growth and left and they have a 452 and half-year-old child together. This has been very stressful for him. He's been experiencing a major increase in his anxiety and has been having for frequent panic attacks. He's not been able to sleep well for several weeks.   Review of Systems  BP 151/91  Pulse 84  Temp(Src) 97.9 F (36.6 C)  Ht 5\' 7"  (1.702 m)  Wt 147 lb (66.679 kg)  BMI 23.02 kg/m2  SpO2 97%    Allergies  Allergen Reactions  . Lisinopril Other (See Comments)    SOB    No past medical history on file.  Past Surgical History  Procedure Laterality Date  . No past surgeries      History   Social History  . Marital Status: Single    Spouse Name: N/A    Number of Children: N/A  . Years of Education: N/A   Occupational History  . supervisor Other   Social History Main Topics  . Smoking status: Never Smoker   . Smokeless tobacco: Not on file  . Alcohol Use: No  . Drug Use: Yes  . Sexual Activity: Not on file   Other Topics Concern  . Not on file   Social History Narrative  . No narrative on file    Family History  Problem Relation Age of Onset   . Multiple sclerosis Mother   . Multiple sclerosis Brother     Outpatient Encounter Prescriptions as of 01/01/2014  Medication Sig  . amLODipine (NORVASC) 5 MG tablet Take 1 tablet (5 mg total) by mouth daily.  Marland Kitchen. PARoxetine (PAXIL) 40 MG tablet 1/2 tab daily for 2 weeks, then increase to a whole.  . [DISCONTINUED] amLODipine (NORVASC) 5 MG tablet Take 1 tablet (5 mg total) by mouth daily.  . [DISCONTINUED] DULoxetine (CYMBALTA) 30 MG capsule Take 1 capsule (30 mg total) by mouth daily. Generic please  . [DISCONTINUED] gabapentin (NEURONTIN) 300 MG capsule One tab PO qHS for a week, then BID for a week, then TID.  May increase to a max of 4 tabs PO TID.  . [DISCONTINUED] HYDROcodone-acetaminophen (NORCO/VICODIN) 5-325 MG per tablet Take 1-2 tablets by mouth every 6 (six) hours as needed for moderate pain or severe pain.  . [DISCONTINUED] traMADol (ULTRAM) 50 MG tablet Take 1 tablet (50 mg total) by mouth every 8 (eight) hours as needed for pain.          Objective:   Physical Exam  Constitutional: He is oriented to person, place, and time. He appears well-developed and well-nourished.  HENT:  Head: Normocephalic and atraumatic.  Cardiovascular: Normal rate, regular rhythm and normal heart sounds.   Pulmonary/Chest: Effort normal and breath sounds normal.  Neurological: He is alert and oriented to person, place, and time.  Skin: Skin is warm and dry.  Psychiatric: He has a normal mood and affect. His behavior is normal.          Assessment & Plan:  Hypertension-uncontrolled. Will start amlodipine 5 mg daily. Warned about potential side effects. Followup in one month to make sure tolerating medication well and to make adjustments as needed. Consider doing blood work at that time.  Insomnia-directly related to his increase in anxiety. Please see note below.  Acute situational stress-we discussed starting a medication to help control his general anxiety and panic attacks. He says  he is willing to try something. He's never really taken anything before. He says his lost 10 pounds in the last couple months because he just doesn't want to eat because of his stress levels. Will start Paxil. 20 mg daily for 2 weeks and then increase to 40 mg. Follow up in one month to make sure tolerating well. Again, discussed potential side effects. Call if any problems

## 2014-01-04 ENCOUNTER — Ambulatory Visit (INDEPENDENT_AMBULATORY_CARE_PROVIDER_SITE_OTHER): Payer: BC Managed Care – PPO | Admitting: Sports Medicine

## 2014-01-04 ENCOUNTER — Encounter: Payer: Self-pay | Admitting: Sports Medicine

## 2014-01-04 ENCOUNTER — Encounter: Payer: Self-pay | Admitting: Family Medicine

## 2014-01-04 VITALS — BP 146/87 | HR 94 | Wt 146.0 lb

## 2014-01-04 DIAGNOSIS — M5030 Other cervical disc degeneration, unspecified cervical region: Secondary | ICD-10-CM | POA: Insufficient documentation

## 2014-01-04 DIAGNOSIS — G5630 Lesion of radial nerve, unspecified upper limb: Secondary | ICD-10-CM

## 2014-01-04 DIAGNOSIS — M5412 Radiculopathy, cervical region: Secondary | ICD-10-CM | POA: Insufficient documentation

## 2014-01-04 NOTE — Assessment & Plan Note (Signed)
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 Ultrasound guided injection around the supinator muscle belly, as well as h hydrodissection of the radial nerve. Strapped with compressive dressing. Return in one month. If no better we will consider nerve conduction studies.

## 2014-01-04 NOTE — Progress Notes (Signed)
   Subjective:    I'm seeing this patient as a consultation for:  Dr. Linford ArnoldMetheney, Dr. Susy Frizzleharles Sheldon with emergency medicine  CC: Left arm pain  HPI: This is a very pleasant 43 year old male, I saw him as a curbside consultation in the past Dr. Linford ArnoldMetheney. He had pain localized over the lateral dorsal left forearm causing numbness and tingling into the dorsum of his hand. It initially thought that this represented a radial nerve compression. Recently he went to the emergency department with similar symptoms and it was recommended that he come see sports medicine/orthopedics regarding this complaint. Pain is moderate, persistent, worse with pronation and supination. He does endorse some weakness to dorsiflexion of the wrist.  Past medical history, Surgical history, Family history not pertinant except as noted below, Social history, Allergies, and medications have been entered into the medical record, reviewed, and no changes needed.   Review of Systems: No headache, visual changes, nausea, vomiting, diarrhea, constipation, dizziness, abdominal pain, skin rash, fevers, chills, night sweats, weight loss, swollen lymph nodes, body aches, joint swelling, muscle aches, chest pain, shortness of breath, mood changes, visual or auditory hallucinations.   Objective:   General: Well Developed, well nourished, and in no acute distress.  Neuro/Psych: Alert and oriented x3, extra-ocular muscles intact, able to move all 4 extremities, sensation grossly intact. Skin: Warm and dry, no rashes noted.  Respiratory: Not using accessory muscles, speaking in full sentences, trachea midline.  Cardiovascular: Pulses palpable, no extremity edema. Abdomen: Does not appear distended. Left Elbow: Unremarkable to inspection. Range of motion full pronation, supination, flexion, extension. Strength is full to all of the above directions Stable to varus, valgus stress. Negative moving valgus stress test. Tender to palpation  over the dorsal lateral proximal forearm over the extensor carpi radialis tendons, and supinator. Pressure over this location causes paresthesias over the dorsum of the forearm to the hand. Ulnar nerve does not sublux. Negative cubital tunnel Tinel's.  Procedure: Real-time Ultrasound Guided peripheral nerve block of the radial nerve Device: GE Logiq E  Verbal informed consent obtained.  Time-out conducted.  Noted no overlying erythema, induration, or other signs of local infection.  Skin prepped in a sterile fashion.  Local anesthesia: Topical Ethyl chloride.  With sterile technique and under real time ultrasound guidance: 25-gauge needle advanced, I placed a small amount of medicine at the superficial fascial plane of the supinator muscle, I advanced the needle further and hydrodissected the radial nerve. A total of 1 cc Kenalog 40, 4 cc lidocaine was injected.  Completed without difficulty  Pain immediately resolved suggesting accurate placement of the medication.  Advised to call if fevers/chills, erythema, induration, drainage, or persistent bleeding.  Images permanently stored and available for review in the ultrasound unit.  Impression: Technically successful ultrasound guided injection.  Impression and Recommendations:   This case required medical decision making of moderate complexity.

## 2014-01-27 ENCOUNTER — Encounter: Payer: Self-pay | Admitting: Sports Medicine

## 2014-01-29 ENCOUNTER — Ambulatory Visit: Payer: Self-pay | Admitting: Family Medicine

## 2014-01-29 ENCOUNTER — Ambulatory Visit (INDEPENDENT_AMBULATORY_CARE_PROVIDER_SITE_OTHER): Payer: BC Managed Care – PPO | Admitting: Sports Medicine

## 2014-01-29 ENCOUNTER — Encounter: Payer: Self-pay | Admitting: Sports Medicine

## 2014-01-29 ENCOUNTER — Ambulatory Visit (INDEPENDENT_AMBULATORY_CARE_PROVIDER_SITE_OTHER): Payer: BC Managed Care – PPO

## 2014-01-29 VITALS — BP 132/89 | HR 66 | Ht 67.0 in | Wt 149.0 lb

## 2014-01-29 DIAGNOSIS — R209 Unspecified disturbances of skin sensation: Secondary | ICD-10-CM

## 2014-01-29 DIAGNOSIS — M5412 Radiculopathy, cervical region: Secondary | ICD-10-CM

## 2014-01-29 DIAGNOSIS — R202 Paresthesia of skin: Secondary | ICD-10-CM

## 2014-01-29 DIAGNOSIS — M542 Cervicalgia: Secondary | ICD-10-CM

## 2014-01-29 MED ORDER — PREDNISONE 50 MG PO TABS: ORAL_TABLET | ORAL | Status: DC

## 2014-01-29 NOTE — Addendum Note (Signed)
Addended by: Monica BectonHEKKEKANDAM, THOMAS J on: 01/29/2014 02:48 PM   Modules accepted: Orders

## 2014-01-29 NOTE — Assessment & Plan Note (Addendum)
With persistent numbness and tingling of the arm and failure of the radial nerve hydrodissection for supinator syndrome, we do need to determine the exact point of nerve impingement. Neck x-rays. Nerve conduction studies. 5 days of prednisone. Return to see me to go over results of the nerve conduction study to determine a subsequent plan.  X-rays do show severe cervical degenerative disc disease, this is the likely cause of his symptoms and likely related to the left C6 or C7 nerve root. I am going to add physical therapy.

## 2014-01-29 NOTE — Progress Notes (Signed)
  Subjective:    CC: Followup  HPI: Left arm pain: Continues to have paresthesias radiating down the dorsum of his left forearm to the dorsum of his fingers. Initially we found a painful point over the supinator muscle and I performed a radial nerve hydrodissection for supinator syndrome. This did not help, not even temporarily. We've not evaluated his cervical spine however he tells me he has no neck pain whatsoever. Symptoms continued to be moderate, persistent.  Past medical history, Surgical history, Family history not pertinant except as noted below, Social history, Allergies, and medications have been entered into the medical record, reviewed, and no changes needed.   Review of Systems: No fevers, chills, night sweats, weight loss, chest pain, or shortness of breath.   Objective:    General: Well Developed, well nourished, and in no acute distress.  Neuro: Alert and oriented x3, extra-ocular muscles intact, sensation grossly intact.  HEENT: Normocephalic, atraumatic, pupils equal round reactive to light, neck supple, no masses, no lymphadenopathy, thyroid nonpalpable.  Skin: Warm and dry, no rashes. Cardiac: Regular rate and rhythm, no murmurs rubs or gallops, no lower extremity edema.  Respiratory: Clear to auscultation bilaterally. Not using accessory muscles, speaking in full sentences.  Cervical spine x-rays reviewed and do show multilevel severe degenerative disc disease.  Impression and Recommendations:

## 2014-02-02 ENCOUNTER — Ambulatory Visit: Payer: BC Managed Care – PPO | Admitting: Family Medicine

## 2014-02-10 ENCOUNTER — Ambulatory Visit: Payer: BC Managed Care – PPO | Admitting: Physical Therapy

## 2014-02-12 ENCOUNTER — Encounter (HOSPITAL_BASED_OUTPATIENT_CLINIC_OR_DEPARTMENT_OTHER): Payer: Self-pay | Admitting: Emergency Medicine

## 2014-02-12 ENCOUNTER — Emergency Department (HOSPITAL_BASED_OUTPATIENT_CLINIC_OR_DEPARTMENT_OTHER)
Admission: EM | Admit: 2014-02-12 | Discharge: 2014-02-12 | Disposition: A | Discharge status: Home / Self Care | Payer: BC Managed Care – PPO | Attending: Emergency Medicine | Admitting: Emergency Medicine

## 2014-02-12 DIAGNOSIS — IMO0002 Reserved for concepts with insufficient information to code with codable children: Secondary | ICD-10-CM | POA: Insufficient documentation

## 2014-02-12 DIAGNOSIS — Z79899 Other long term (current) drug therapy: Secondary | ICD-10-CM | POA: Insufficient documentation

## 2014-02-12 DIAGNOSIS — K529 Noninfective gastroenteritis and colitis, unspecified: Secondary | ICD-10-CM

## 2014-02-12 DIAGNOSIS — K5289 Other specified noninfective gastroenteritis and colitis: Secondary | ICD-10-CM | POA: Insufficient documentation

## 2014-02-12 DIAGNOSIS — R000 Tachycardia, unspecified: Secondary | ICD-10-CM | POA: Insufficient documentation

## 2014-02-12 DIAGNOSIS — I10 Essential (primary) hypertension: Secondary | ICD-10-CM | POA: Insufficient documentation

## 2014-02-12 HISTORY — DX: Essential (primary) hypertension: I10

## 2014-02-12 LAB — COMPREHENSIVE METABOLIC PANEL
ALK PHOS: 83 U/L (ref 39–117)
ALT: 18 U/L (ref 0–53)
AST: 23 U/L (ref 0–37)
Albumin: 3.9 g/dL (ref 3.5–5.2)
BILIRUBIN TOTAL: 0.6 mg/dL (ref 0.3–1.2)
BUN: 17 mg/dL (ref 6–23)
CALCIUM: 9 mg/dL (ref 8.4–10.5)
CHLORIDE: 99 meq/L (ref 96–112)
CO2: 25 meq/L (ref 19–32)
Creatinine, Ser: 1.1 mg/dL (ref 0.50–1.35)
GFR calc Af Amer: >90 mL/min (ref >90)
GFR, EST NON AFRICAN AMERICAN: 81 mL/min — AB (ref >90)
GLUCOSE: 133 mg/dL — AB (ref 70–99)
Potassium: 4.1 mEq/L (ref 3.7–5.3)
SODIUM: 137 meq/L (ref 137–147)
Total Protein: 7.9 g/dL (ref 6.0–8.3)

## 2014-02-12 LAB — CBC WITH DIFFERENTIAL/PLATELET
Basophils Absolute: 0 10*3/uL (ref 0.0–0.1)
Basophils Relative: 0 % (ref 0–1)
EOS PCT: 0 % (ref 0–5)
Eosinophils Absolute: 0 10*3/uL (ref 0.0–0.7)
HEMATOCRIT: 45.8 % (ref 39.0–52.0)
Hemoglobin: 16.1 g/dL (ref 13.0–17.0)
LYMPHS ABS: 0.8 10*3/uL (ref 0.7–4.0)
LYMPHS PCT: 7 % — AB (ref 12–46)
MCH: 33.7 pg (ref 26.0–34.0)
MCHC: 35.2 g/dL (ref 30.0–36.0)
MCV: 95.8 fL (ref 78.0–100.0)
Monocytes Absolute: 0.8 10*3/uL (ref 0.1–1.0)
Monocytes Relative: 7 % (ref 3–12)
Neutro Abs: 9.3 10*3/uL — ABNORMAL HIGH (ref 1.7–7.7)
Neutrophils Relative %: 86 % — ABNORMAL HIGH (ref 43–77)
PLATELETS: 272 10*3/uL (ref 150–400)
RBC: 4.78 MIL/uL (ref 4.22–5.81)
RDW: 11.1 % — ABNORMAL LOW (ref 11.5–15.5)
WBC: 10.8 10*3/uL — AB (ref 4.0–10.5)

## 2014-02-12 LAB — URINALYSIS, ROUTINE W REFLEX MICROSCOPIC
BILIRUBIN URINE: NEGATIVE
Glucose, UA: NEGATIVE mg/dL
HGB URINE DIPSTICK: NEGATIVE
Ketones, ur: NEGATIVE mg/dL
Leukocytes, UA: NEGATIVE
Nitrite: NEGATIVE
PROTEIN: NEGATIVE mg/dL
Specific Gravity, Urine: 1.027 (ref 1.005–1.030)
Urobilinogen, UA: 0.2 mg/dL (ref 0.0–1.0)
pH: 6 (ref 5.0–8.0)

## 2014-02-12 LAB — LIPASE, BLOOD: Lipase: 33 U/L (ref 11–59)

## 2014-02-12 MED ORDER — ONDANSETRON HCL 4 MG/2ML IJ SOLN
4.0000 mg | Freq: Once | INTRAMUSCULAR | Status: AC
  Administered 2014-02-12: 4 mg via INTRAVENOUS
  Filled 2014-02-12: qty 2

## 2014-02-12 MED ORDER — SODIUM CHLORIDE 0.9 % IV BOLUS (SEPSIS)
1000.0000 mL | Freq: Once | INTRAVENOUS | Status: AC
  Administered 2014-02-12: 1000 mL via INTRAVENOUS

## 2014-02-12 MED ORDER — ACETAMINOPHEN 500 MG PO TABS
1000.0000 mg | ORAL_TABLET | Freq: Once | ORAL | Status: AC
  Administered 2014-02-12: 1000 mg via ORAL

## 2014-02-12 MED ORDER — ACETAMINOPHEN 500 MG PO TABS
ORAL_TABLET | ORAL | Status: AC
  Filled 2014-02-12: qty 2

## 2014-02-12 MED ORDER — ONDANSETRON HCL 4 MG PO TABS: 4.0000 mg | ORAL_TABLET | Freq: Four times a day (QID) | ORAL | Status: DC

## 2014-02-12 NOTE — ED Provider Notes (Signed)
CSN: [id number]     Arrival date & time 02/12/14  1322 History   First MD Initiated Contact with Patient 02/12/14 1335     Chief Complaint  Patient presents with  . Abdominal Pain  . Emesis     (Consider location/radiation/quality/duration/timing/severity/associated sxs/prior Treatment) HPI Comments: Patient is 43 year old male with PMHx significant for HTN who presents to the ED with a 4 day history of N/V/D.  He states that the symptoms started on Monday and have progressively worsenend.  He reports multiple daily episodes of NBNB emesis (at least 5 per day).  He states that he has been trying to take in crackers and sips of fluids but is unable to keep this down.  Reports 3-4 episodes of watery diarrhea daily since Tuesday.  He denies blood or mucous in the diarrhea.  He reports fever to 101 yesterday, also with daughter at home with similar symptoms.  He denies abdominal pain but does reports burning pain up into his chest from the repeated vomiting.  He denies chest tightness, shortness of breath, abdominal pain or cramping, dysuria, hematuria  Patient is a 43 y.o. male presenting with vomiting. The history is provided by the patient. No language interpreter was used.  Emesis   Past Medical History  Diagnosis Date  . Hypertension    Past Surgical History  Procedure Laterality Date  . No past surgeries     Family History  Problem Relation Age of Onset  . Multiple sclerosis Mother   . Multiple sclerosis Brother    History  Substance Use Topics  . Smoking status: Never Smoker   . Smokeless tobacco: Not on file  . Alcohol Use: No    Review of Systems  Gastrointestinal: Positive for vomiting.  All other systems reviewed and are negative.      Allergies  Review of patient's allergies indicates no known allergies.  Home Medications   Current Outpatient Rx  Name  Route  Sig  Dispense  Refill  . amLODipine (NORVASC) 5 MG tablet   Oral   Take 1 tablet (5 mg total) by  mouth daily.   30 tablet   1   . PARoxetine (PAXIL) 40 MG tablet      1/2 tab daily for 2 weeks, then increase to a whole.   30 tablet   0   . predniSONE (DELTASONE) 50 MG tablet      One tab PO daily for 5 days.   5 tablet   0    BP 137/95  Pulse 116  Temp(Src) 100.2 F (37.9 C) (Oral)  Resp 18  Ht 5\' 7"  (1.702 m)  Wt 146 lb (66.225 kg)  BMI 22.86 kg/m2  SpO2 98% Physical Exam  Nursing note and vitals reviewed. Constitutional: He is oriented to person, place, and time. He appears well-developed and well-nourished. No distress.  HENT:  Head: Normocephalic and atraumatic.  Right Ear: External ear normal.  Left Ear: External ear normal.  Nose: Nose normal.  Mouth/Throat: Oropharynx is clear and moist. No oropharyngeal exudate.  Eyes: Conjunctivae are normal. Pupils are equal, round, and reactive to light. No scleral icterus.  Neck: Normal range of motion. Neck supple.  Cardiovascular: Regular rhythm and normal heart sounds.  Exam reveals no gallop and no friction rub.   No murmur heard. tachycardia  Pulmonary/Chest: Effort normal and breath sounds normal. No respiratory distress. He has no wheezes. He has no rales. He exhibits no tenderness.  Abdominal: Soft. Bowel sounds are normal.  He exhibits no distension. There is no tenderness. There is no rebound and no guarding.  No CVA tenderness  Musculoskeletal: Normal range of motion. He exhibits no edema and no tenderness.  Lymphadenopathy:    He has no cervical adenopathy.  Neurological: He is alert and oriented to person, place, and time. He exhibits normal muscle tone. Coordination normal.  Skin: Skin is warm and dry. No rash noted. No erythema. No pallor.  Psychiatric: He has a normal mood and affect. His behavior is normal. Judgment and thought content normal.    ED Course  Procedures (including critical care time) Labs Review Labs Reviewed  CBC WITH DIFFERENTIAL  COMPREHENSIVE METABOLIC PANEL  LIPASE, BLOOD   URINALYSIS, ROUTINE W REFLEX MICROSCOPIC   Imaging Review No results found.  EKG Interpretation  None Results for orders placed during the hospital encounter of 02/12/14  CBC WITH DIFFERENTIAL      Result Value Ref Range   WBC 10.8 (*) 4.0 - 10.5 K/uL   RBC 4.78  4.22 - 5.81 MIL/uL   Hemoglobin 16.1  13.0 - 17.0 g/dL   HCT 16.1  09.6 - 04.5 %   MCV 95.8  78.0 - 100.0 fL   MCH 33.7  26.0 - 34.0 pg   MCHC 35.2  30.0 - 36.0 g/dL   RDW 40.9 (*) 81.1 - 91.4 %   Platelets 272  150 - 400 K/uL   Neutrophils Relative % 86 (*) 43 - 77 %   Neutro Abs 9.3 (*) 1.7 - 7.7 K/uL   Lymphocytes Relative 7 (*) 12 - 46 %   Lymphs Abs 0.8  0.7 - 4.0 K/uL   Monocytes Relative 7  3 - 12 %   Monocytes Absolute 0.8  0.1 - 1.0 K/uL   Eosinophils Relative 0  0 - 5 %   Eosinophils Absolute 0.0  0.0 - 0.7 K/uL   Basophils Relative 0  0 - 1 %   Basophils Absolute 0.0  0.0 - 0.1 K/uL  COMPREHENSIVE METABOLIC PANEL      Result Value Ref Range   Sodium 137  137 - 147 mEq/L   Potassium 4.1  3.7 - 5.3 mEq/L   Chloride 99  96 - 112 mEq/L   CO2 25  19 - 32 mEq/L   Glucose, Bld 133 (*) 70 - 99 mg/dL   BUN 17  6 - 23 mg/dL   Creatinine, Ser 7.82  0.50 - 1.35 mg/dL   Calcium 9.0  8.4 - 95.6 mg/dL   Total Protein 7.9  6.0 - 8.3 g/dL   Albumin 3.9  3.5 - 5.2 g/dL   AST 23  0 - 37 U/L   ALT 18  0 - 53 U/L   Alkaline Phosphatase 83  39 - 117 U/L   Total Bilirubin 0.6  0.3 - 1.2 mg/dL   GFR calc non Af Amer 81 (*) >90 mL/min   GFR calc Af Amer >90  >90 mL/min  LIPASE, BLOOD      Result Value Ref Range   Lipase 33  11 - 59 U/L  URINALYSIS, ROUTINE W REFLEX MICROSCOPIC      Result Value Ref Range   Color, Urine YELLOW  YELLOW   APPearance CLOUDY (*) CLEAR   Specific Gravity, Urine 1.027  1.005 - 1.030   pH 6.0  5.0 - 8.0   Glucose, UA NEGATIVE  NEGATIVE mg/dL   Hgb urine dipstick NEGATIVE  NEGATIVE   Bilirubin Urine NEGATIVE  NEGATIVE  Ketones, ur NEGATIVE  NEGATIVE mg/dL   Protein, ur NEGATIVE   NEGATIVE mg/dL   Urobilinogen, UA 0.2  0.0 - 1.0 mg/dL   Nitrite NEGATIVE  NEGATIVE   Leukocytes, UA NEGATIVE  NEGATIVE   Dg Cervical Spine Complete  01/29/2014   CLINICAL DATA:  Pain  EXAM: CERVICAL SPINE  4+ VIEWS  COMPARISON:  None.  FINDINGS: Frontal, lateral, open-mouth odontoid, and bilateral oblique views were obtained. There is no fracture or spondylolisthesis. Prevertebral soft tissues and predental space regions are normal. There is moderately severe disc space narrowing at C4-5 and C5-6. There is milder disc space narrowing at C3-4. There are prominent anterior osteophytes at C4 and C5. On the oblique views, there is evidence of exit foraminal narrowing at C3-4, C4-5, and C5-6 bilaterally. There is reversal of lordotic curvature.  IMPRESSION: Multilevel osteoarthritic change. No fracture or spondylolisthesis. Reversal of lordotic curvature may well be due to muscle spasm. If there is concern for ligamentous injury, however, lateral flexion-extension views could be helpful to further assess.   Electronically Signed   By: Bretta BangWilliam  Woodruff M.D.   On: 01/29/2014 09:26    6:34 PM Reports feeling better after 2 liters of fluids and nausea medication, labs reassuring.  Medications  sodium chloride 0.9 % bolus 1,000 mL (0 mLs Intravenous Stopped 02/12/14 1512)  ondansetron (ZOFRAN) injection 4 mg (4 mg Intravenous Given 02/12/14 1404)  sodium chloride 0.9 % bolus 1,000 mL (1,000 mLs Intravenous New Bag/Given 02/12/14 1528)  acetaminophen (TYLENOL) tablet 1,000 mg (1,000 mg Oral Given 02/12/14 1646)    MDM   Gastroenteritis  Patient with HTN presents with a day history of nausea, vomiting and diarrhea, no abdominal pain, labs reassuring, able to tolerate po fluids here.    Izola PriceFrances C. Marisue HumbleSanford, New JerseyPA-C 02/12/14 [id number]

## 2014-02-12 NOTE — ED Notes (Signed)
Patient states he has a four day history of nausea, vomiting and diarrhea.  States symptoms started off mild and for the last two days have been having N/V/D hourly.  Denies pain.  States his young child had same recently.

## 2014-02-12 NOTE — Discharge Instructions (Signed)
Viral Gastroenteritis Viral gastroenteritis is also known as stomach flu. This condition affects the stomach and intestinal tract. It can cause sudden diarrhea and vomiting. The illness typically lasts 3 to 8 days. Most people develop an immune response that eventually gets rid of the virus. While this natural response develops, the virus can make you quite ill. CAUSES  Many different viruses can cause gastroenteritis, such as rotavirus or noroviruses. You can catch one of these viruses by consuming contaminated food or water. You may also catch a virus by sharing utensils or other personal items with an infected person or by touching a contaminated surface. SYMPTOMS  The most common symptoms are diarrhea and vomiting. These problems can cause a severe loss of body fluids (dehydration) and a body salt (electrolyte) imbalance. Other symptoms may include:  Fever.  Headache.  Fatigue.  Abdominal pain. DIAGNOSIS  Your caregiver can usually diagnose viral gastroenteritis based on your symptoms and a physical exam. A stool sample may also be taken to test for the presence of viruses or other infections. TREATMENT  This illness typically goes away on its own. Treatments are aimed at rehydration. The most serious cases of viral gastroenteritis involve vomiting so severely that you are not able to keep fluids down. In these cases, fluids must be given through an intravenous line (IV). HOME CARE INSTRUCTIONS   Drink enough fluids to keep your urine clear or pale yellow. Drink small amounts of fluids frequently and increase the amounts as tolerated.  Ask your caregiver for specific rehydration instructions.  Avoid:  Foods high in sugar.  Alcohol.  Carbonated drinks.  Tobacco.  Juice.  Caffeine drinks.  Extremely hot or cold fluids.  Fatty, greasy foods.  Too much intake of anything at one time.  Dairy products until 24 to 48 hours after diarrhea stops.  You may consume probiotics.  Probiotics are active cultures of beneficial bacteria. They may lessen the amount and number of diarrheal stools in adults. Probiotics can be found in yogurt with active cultures and in supplements.  Wash your hands well to avoid spreading the virus.  Only take over-the-counter or prescription medicines for pain, discomfort, or fever as directed by your caregiver. Do not give aspirin to children. Antidiarrheal medicines are not recommended.  Ask your caregiver if you should continue to take your regular prescribed and over-the-counter medicines.  Keep all follow-up appointments as directed by your caregiver. SEEK IMMEDIATE MEDICAL CARE IF:   You are unable to keep fluids down.  You do not urinate at least once every 6 to 8 hours.  You develop shortness of breath.  You notice blood in your stool or vomit. This may look like coffee grounds.  You have abdominal pain that increases or is concentrated in one small area (localized).  You have persistent vomiting or diarrhea.  You have a fever.  The patient is a child younger than 3 months, and he or she has a fever.  The patient is a child older than 3 months, and he or she has a fever and persistent symptoms.  The patient is a child older than 3 months, and he or she has a fever and symptoms suddenly get worse.  The patient is a baby, and he or she has no tears when crying. MAKE SURE YOU:   Understand these instructions.  Will watch your condition.  Will get help right away if you are not doing well or get worse. Document Released: 12/03/2005 Document Revised: 02/25/2012 Document Reviewed: 09/19/2011   [employer] Patient Information 2014 [employer]  

## 2014-02-13 NOTE — ED Provider Notes (Signed)
Medical screening examination/treatment/procedure(s) were performed by non-physician practitioner and as supervising physician I was immediately available for consultation/collaboration.   EKG Interpretation None        Charles B. Sheldon, MD 02/13/14 0903 

## 2014-02-15 ENCOUNTER — Ambulatory Visit: Payer: BC Managed Care – PPO | Admitting: Physical Therapy

## 2014-02-18 ENCOUNTER — Ambulatory Visit (INDEPENDENT_AMBULATORY_CARE_PROVIDER_SITE_OTHER): Payer: Self-pay

## 2014-02-18 ENCOUNTER — Ambulatory Visit (INDEPENDENT_AMBULATORY_CARE_PROVIDER_SITE_OTHER): Payer: BC Managed Care – PPO | Admitting: Neurology

## 2014-02-18 DIAGNOSIS — R209 Unspecified disturbances of skin sensation: Secondary | ICD-10-CM

## 2014-02-18 DIAGNOSIS — Z0289 Encounter for other administrative examinations: Secondary | ICD-10-CM

## 2014-02-18 NOTE — Procedures (Signed)
     HISTORY:  Gabriel Juarez is a 43 year old gentleman with a history of some paresthesias going down the left arm from the neck and shoulder to the hand associated with a cold sensation. The patient has noted symptoms for about one year, and he indicates that the symptoms have worsened over time. The patient is being evaluated for a possible neuropathy or a cervical radiculopathy.  NERVE CONDUCTION STUDIES:  Nerve conduction studies were performed on both upper extremities. The distal motor latencies and motor amplitudes for the median and ulnar nerves were within normal limits. The F wave latencies and nerve conduction velocities for these nerves were also normal. The sensory latencies for the median and ulnar nerves were normal.   EMG STUDIES:  EMG study was performed on the left upper extremity:  The first dorsal interosseous muscle reveals 2 to 4 K units with full recruitment. No fibrillations or positive waves were noted. The abductor pollicis brevis muscle reveals 2 to 4 K units with full recruitment. No fibrillations or positive waves were noted. The extensor indicis proprius muscle reveals 1 to 3 K units with full recruitment. No fibrillations or positive waves were noted. The pronator teres muscle reveals 2 to 3 K units with full recruitment. No fibrillations or positive waves were noted. The biceps muscle reveals 1 to 2 K units with full recruitment. No fibrillations or positive waves were noted. The triceps muscle reveals 2 to 4 K units with full recruitment. No fibrillations or positive waves were noted. The anterior deltoid muscle reveals 2 to 3 K units with full recruitment. No fibrillations or positive waves were noted. The cervical paraspinal muscles were tested at 2 levels. No abnormalities of insertional activity were seen at either level tested. There was good relaxation.   IMPRESSION:  Nerve conduction studies done on both upper extremities were within normal  limits. No evidence of a neuropathy is seen. EMG evaluation of the left upper extremity is normal without evidence of an overlying cervical radiculopathy.  Gabriel Juarez. Gabriel Willis MD 02/18/2014 10:40 AM  [facility] [address] [place], [place] [id number]  Phone [phone] Fax [phone]

## 2014-02-22 ENCOUNTER — Ambulatory Visit (INDEPENDENT_AMBULATORY_CARE_PROVIDER_SITE_OTHER): Payer: BC Managed Care – PPO | Admitting: Physical Therapy

## 2014-02-22 DIAGNOSIS — M25539 Pain in unspecified wrist: Secondary | ICD-10-CM

## 2014-02-22 DIAGNOSIS — M5412 Radiculopathy, cervical region: Secondary | ICD-10-CM

## 2014-02-22 DIAGNOSIS — M2560 Stiffness of unspecified joint, not elsewhere classified: Secondary | ICD-10-CM

## 2014-02-22 DIAGNOSIS — R209 Unspecified disturbances of skin sensation: Secondary | ICD-10-CM

## 2014-02-22 DIAGNOSIS — M6281 Muscle weakness (generalized): Secondary | ICD-10-CM

## 2014-02-23 ENCOUNTER — Encounter: Payer: Self-pay | Admitting: Sports Medicine

## 2014-02-26 ENCOUNTER — Encounter: Payer: BC Managed Care – PPO | Admitting: Physical Therapy

## 2014-03-01 ENCOUNTER — Encounter: Payer: BC Managed Care – PPO | Admitting: Physical Therapy

## 2014-03-03 ENCOUNTER — Encounter: Payer: Self-pay | Admitting: Sports Medicine

## 2014-03-05 ENCOUNTER — Encounter (INDEPENDENT_AMBULATORY_CARE_PROVIDER_SITE_OTHER): Payer: BC Managed Care – PPO | Admitting: Physical Therapy

## 2014-03-05 DIAGNOSIS — M5412 Radiculopathy, cervical region: Secondary | ICD-10-CM

## 2014-03-05 DIAGNOSIS — M2560 Stiffness of unspecified joint, not elsewhere classified: Secondary | ICD-10-CM

## 2014-03-05 DIAGNOSIS — R209 Unspecified disturbances of skin sensation: Secondary | ICD-10-CM

## 2014-03-05 DIAGNOSIS — M25539 Pain in unspecified wrist: Secondary | ICD-10-CM

## 2014-03-05 DIAGNOSIS — M6281 Muscle weakness (generalized): Secondary | ICD-10-CM

## 2014-03-08 ENCOUNTER — Encounter (INDEPENDENT_AMBULATORY_CARE_PROVIDER_SITE_OTHER): Payer: BC Managed Care – PPO | Admitting: Physical Therapy

## 2014-03-08 DIAGNOSIS — M2560 Stiffness of unspecified joint, not elsewhere classified: Secondary | ICD-10-CM

## 2014-03-08 DIAGNOSIS — M6281 Muscle weakness (generalized): Secondary | ICD-10-CM

## 2014-03-08 DIAGNOSIS — M5412 Radiculopathy, cervical region: Secondary | ICD-10-CM

## 2014-03-08 DIAGNOSIS — M25539 Pain in unspecified wrist: Secondary | ICD-10-CM

## 2014-03-12 ENCOUNTER — Encounter (INDEPENDENT_AMBULATORY_CARE_PROVIDER_SITE_OTHER): Payer: BC Managed Care – PPO | Admitting: Physical Therapy

## 2014-03-12 ENCOUNTER — Encounter: Payer: BC Managed Care – PPO | Admitting: Physical Therapy

## 2014-03-12 DIAGNOSIS — M2560 Stiffness of unspecified joint, not elsewhere classified: Secondary | ICD-10-CM

## 2014-03-12 DIAGNOSIS — R209 Unspecified disturbances of skin sensation: Secondary | ICD-10-CM

## 2014-03-12 DIAGNOSIS — M25539 Pain in unspecified wrist: Secondary | ICD-10-CM

## 2014-03-12 DIAGNOSIS — M5412 Radiculopathy, cervical region: Secondary | ICD-10-CM

## 2014-03-12 DIAGNOSIS — M6281 Muscle weakness (generalized): Secondary | ICD-10-CM

## 2014-03-15 ENCOUNTER — Encounter (INDEPENDENT_AMBULATORY_CARE_PROVIDER_SITE_OTHER): Payer: BC Managed Care – PPO | Admitting: Physical Therapy

## 2014-03-15 DIAGNOSIS — M6281 Muscle weakness (generalized): Secondary | ICD-10-CM

## 2014-03-15 DIAGNOSIS — M2560 Stiffness of unspecified joint, not elsewhere classified: Secondary | ICD-10-CM

## 2014-03-15 DIAGNOSIS — M5412 Radiculopathy, cervical region: Secondary | ICD-10-CM

## 2014-03-15 DIAGNOSIS — M25539 Pain in unspecified wrist: Secondary | ICD-10-CM

## 2014-03-15 DIAGNOSIS — R209 Unspecified disturbances of skin sensation: Secondary | ICD-10-CM

## 2014-03-16 ENCOUNTER — Ambulatory Visit (INDEPENDENT_AMBULATORY_CARE_PROVIDER_SITE_OTHER): Payer: BC Managed Care – PPO | Admitting: Sports Medicine

## 2014-03-16 ENCOUNTER — Encounter: Payer: Self-pay | Admitting: Sports Medicine

## 2014-03-16 ENCOUNTER — Telehealth: Payer: Self-pay | Admitting: *Deleted

## 2014-03-16 VITALS — BP 132/84 | HR 87 | Ht 67.0 in | Wt 152.0 lb

## 2014-03-16 DIAGNOSIS — M7712 Lateral epicondylitis, left elbow: Secondary | ICD-10-CM | POA: Insufficient documentation

## 2014-03-16 DIAGNOSIS — M7710 Lateral epicondylitis, unspecified elbow: Secondary | ICD-10-CM

## 2014-03-16 DIAGNOSIS — M5412 Radiculopathy, cervical region: Secondary | ICD-10-CM

## 2014-03-16 NOTE — Progress Notes (Signed)
  Subjective:    CC: Followup  HPI: Gabriel Juarez returns for left elbow pain, he has had great difficulty diagnosing it, initially it sounded as though it was a radial nerve entrapment, he did well temporarily after he radial nerve hydrodissected, unfortunately pain returned radiating down the left arm in a C6 distribution, subsequent C-spine x-rays showed fairly severe multilevel degenerative disc disease, and a subsequent nerve conduction study was completely negative. Today his pain is predominantly localized over the common extensor tendon origin and reproduced with palpation and wrist extension. Moderate, persistent.  Past medical history, Surgical history, Family history not pertinant except as noted below, Social history, Allergies, and medications have been entered into the medical record, reviewed, and no changes needed.   Review of Systems: No fevers, chills, night sweats, weight loss, chest pain, or shortness of breath.   Objective:    General: Well Developed, well nourished, and in no acute distress.  Neuro: Alert and oriented x3, extra-ocular muscles intact, sensation grossly intact.  HEENT: Normocephalic, atraumatic, pupils equal round reactive to light, neck supple, no masses, no lymphadenopathy, thyroid nonpalpable.  Skin: Warm and dry, no rashes. Cardiac: Regular rate and rhythm, no murmurs rubs or gallops, no lower extremity edema.  Respiratory: Clear to auscultation bilaterally. Not using accessory muscles, speaking in full sentences. Left Elbow: Unremarkable to inspection. Range of motion full pronation, supination, flexion, extension. Strength is full to all of the above directions Stable to varus, valgus stress. Negative moving valgus stress test. Tender to palpation over the common extensor tendon origin. Ulnar nerve does not sublux. Negative cubital tunnel Tinel's.  Procedure: Real-time Ultrasound Guided Injection of left common extensor tendon Device: GE Logiq E    Verbal informed consent obtained.  Time-out conducted.  Noted no overlying erythema, induration, or other signs of local infection.  Skin prepped in a sterile fashion.  Local anesthesia: Topical Ethyl chloride.  With sterile technique and under real time ultrasound guidance:  25-gauge needle advanced and a total of 1 cc Kenalog 40, 4 cc lidocaine injected with superficial to and deep to the common extensor tendon. Completed without difficulty  Pain immediately resolved suggesting accurate placement of the medication.  Advised to call if fevers/chills, erythema, induration, drainage, or persistent bleeding.  Images permanently stored and available for review in the ultrasound unit.  Impression: Technically successful ultrasound guided injection.  The arm was then strapped with compressive dressing.  Impression and Recommendations:

## 2014-03-16 NOTE — Assessment & Plan Note (Signed)
I think that Gabriel Juarez has 2 problems, left lateral epicondylitis is the predominant issue today. He has failed formal physical therapy, NSAIDs, steroids, we did perform a guided common extensor tendon injection today Return in one month.

## 2014-03-16 NOTE — Telephone Encounter (Signed)
[facility] MRI Cervical Spine w/o contrast.  Auth #$ [id number] good from 03/16/2014 to 04/14/2014.  [facility] notified. Barry DienesKimberly Tiauna Whisnant, LPN

## 2014-03-16 NOTE — Assessment & Plan Note (Signed)
Nerve conduction was negative despite his fairly severe cervical degenerative disc disease. We are going to obtain an MRI of the cervical spine, I would like to see him back in a month after his elbow injection, this will help us determine principal pain generators from the cervical spine or his common extensor tendon at the elbow.

## 2014-03-17 ENCOUNTER — Other Ambulatory Visit: Payer: Self-pay | Admitting: Family Medicine

## 2014-03-19 ENCOUNTER — Encounter: Payer: BC Managed Care – PPO | Admitting: Physical Therapy

## 2014-03-31 ENCOUNTER — Ambulatory Visit (INDEPENDENT_AMBULATORY_CARE_PROVIDER_SITE_OTHER): Payer: BC Managed Care – PPO

## 2014-03-31 DIAGNOSIS — M5412 Radiculopathy, cervical region: Secondary | ICD-10-CM

## 2014-03-31 DIAGNOSIS — M47812 Spondylosis without myelopathy or radiculopathy, cervical region: Secondary | ICD-10-CM

## 2014-04-06 ENCOUNTER — Telehealth: Payer: Self-pay | Admitting: *Deleted

## 2014-04-06 NOTE — Telephone Encounter (Signed)
Pt LM that he was having CP. Tried to call pt back and got VM. LMOM for pt to return call to further eval.  Meyer CoryMisty Ahmad, LPN

## 2014-04-13 ENCOUNTER — Telehealth: Payer: Self-pay | Admitting: *Deleted

## 2014-04-13 ENCOUNTER — Ambulatory Visit (INDEPENDENT_AMBULATORY_CARE_PROVIDER_SITE_OTHER): Payer: BC Managed Care – PPO | Admitting: Family Medicine

## 2014-04-13 ENCOUNTER — Encounter: Payer: Self-pay | Admitting: Family Medicine

## 2014-04-13 ENCOUNTER — Encounter: Payer: Self-pay | Admitting: Sports Medicine

## 2014-04-13 ENCOUNTER — Ambulatory Visit (INDEPENDENT_AMBULATORY_CARE_PROVIDER_SITE_OTHER): Payer: BC Managed Care – PPO | Admitting: Sports Medicine

## 2014-04-13 ENCOUNTER — Ambulatory Visit (INDEPENDENT_AMBULATORY_CARE_PROVIDER_SITE_OTHER): Payer: BC Managed Care – PPO

## 2014-04-13 VITALS — BP 130/84 | HR 80 | Ht 67.0 in | Wt 149.0 lb

## 2014-04-13 VITALS — BP 135/88 | HR 84

## 2014-04-13 DIAGNOSIS — R9431 Abnormal electrocardiogram [ECG] [EKG]: Secondary | ICD-10-CM

## 2014-04-13 DIAGNOSIS — R0789 Other chest pain: Secondary | ICD-10-CM

## 2014-04-13 DIAGNOSIS — M5412 Radiculopathy, cervical region: Secondary | ICD-10-CM

## 2014-04-13 MED ORDER — TRAMADOL HCL 50 MG PO TABS: ORAL_TABLET | ORAL | Status: DC

## 2014-04-13 NOTE — Progress Notes (Signed)
  Subjective:    CC: Followup  HPI: Continues to have left elbow pain, he also has some neck pain and right elbow pain today. We have tried multiple diagnostic and treatment modalities including a tennis elbow injection at the last visit which was ineffective. He also had nerve conduction studies which were ineffective. A recent MRI was done, the results of which we dictated below. Pain is moderate, persistent.  Past medical history, Surgical history, Family history not pertinant except as noted below, Social history, Allergies, and medications have been entered into the medical record, reviewed, and no changes needed.   Review of Systems: No fevers, chills, night sweats, weight loss, chest pain, or shortness of breath.   Objective:    General: Well Developed, well nourished, and in no acute distress.  Neuro: Alert and oriented x3, extra-ocular muscles intact, sensation grossly intact.  HEENT: Normocephalic, atraumatic, pupils equal round reactive to light, neck supple, no masses, no lymphadenopathy, thyroid nonpalpable.  Skin: Warm and dry, no rashes. Cardiac: Regular rate and rhythm, no murmurs rubs or gallops, no lower extremity edema.  Respiratory: Clear to auscultation bilaterally. Not using accessory muscles, speaking in full sentences.  MRI shows multilevel cervical degenerative disc disease at the C3-4, C4-5, and C5-6 levels with central canal stenosis and indentation of the anterior thecal sac.  Impression and Recommendations:

## 2014-04-13 NOTE — Assessment & Plan Note (Signed)
Gabriel Juarez has been through quite an extensive diagnosis and management so far. The common extensor tendon injection in his elbow was not effective. Nerve conduction studies were negative. He does have significant cervical degenerative disc disease at C3-4, C4-5, and C5-6 levels. At this point we have exhausted conservative measures and we are going to proceed with a cervical epidural. Injection should be scheduled on Thursday, anytime after noon. I like to see him back a couple of weeks after the injection to evaluate response. If no response, at that point we will consider having a spine surgeon take a look at him. In the meantime I like him to try some tramadol for pain.

## 2014-04-13 NOTE — Progress Notes (Signed)
Subjective:    Patient ID: Gabriel PellegriniKenneth Juarez, male    DOB: 08/15/1971, 43 y.o.   MRN: [id number]  HPI Started having CP on the left side of check.  Laughing triggers it and laughig triggers. Last a few minutes and then goes away on it on.  Started a couple of weeks ago.  Not with activity.  No cough. Occ feels SOB.  No fever, chills or sweats.  No stomach sxs. No traum. Has had some heart flutters afterwards for a few minutes dan then stops.  No prior history of pulmonary or heart problems. No recent cold symptoms.   Review of Systems BP 135/88  Pulse 84    No Known Allergies  Past Medical History  Diagnosis Date  . Hypertension     Past Surgical History  Procedure Laterality Date  . No past surgeries      History   Social History  . Marital Status: Single    Spouse Name: N/A    Number of Children: N/A  . Years of Education: N/A   Occupational History  . supervisor Other   Social History Main Topics  . Smoking status: Never Smoker   . Smokeless tobacco: Not on file  . Alcohol Use: No  . Drug Use: No  . Sexual Activity: Not on file   Other Topics Concern  . Not on file   Social History Narrative  . No narrative on file    Family History  Problem Relation Age of Onset  . Multiple sclerosis Mother   . Multiple sclerosis Brother     Outpatient Encounter Prescriptions as of 04/13/2014  Medication Sig  . amLODipine (NORVASC) 5 MG tablet TAKE 1 TABLET (5 MG TOTAL) BY MOUTH DAILY.  . traMADol (ULTRAM) 50 MG tablet 1-2 tabs by mouth Q8 hours, maximum 6 tabs per day.  . [DISCONTINUED] ondansetron (ZOFRAN) 4 MG tablet Take 1 tablet (4 mg total) by mouth every 6 (six) hours.  . [DISCONTINUED] PARoxetine (PAXIL) 40 MG tablet 1/2 tab daily for 2 weeks, then increase to a whole.  . [DISCONTINUED] predniSONE (DELTASONE) 50 MG tablet One tab PO daily for 5 days.          Objective:   Physical Exam  Constitutional: He is oriented to person, place, and time. He  appears well-developed and well-nourished.  HENT:  Head: Normocephalic and atraumatic.  Right Ear: External ear normal.  Left Ear: External ear normal.  Eyes: Conjunctivae are normal. Pupils are equal, round, and reactive to light.  Neck: Neck supple. No thyromegaly present.  Cardiovascular: Normal rate, regular rhythm and normal heart sounds.   No carotid bruits  Pulmonary/Chest: Effort normal and breath sounds normal.  Abdominal: Soft. Bowel sounds are normal. He exhibits no distension and no mass. There is no tenderness. There is no rebound and no guarding.  Musculoskeletal: He exhibits no edema.  Lymphadenopathy:    He has no cervical adenopathy.  Neurological: He is alert and oriented to person, place, and time.  Skin: Skin is warm and dry.  Psychiatric: He has a normal mood and affect. His behavior is normal.          Assessment & Plan:  Atypical chest pain-EKG shows rate of 72 beats per minute, normal sinus rhythm. He has evidence of left ventricular hypertrophy in the lateral leads. No significant ST-T wave changes. That he does have inverted T waves in lead V2. Normal axis. We'll refer her for chest x-ray and blood work. Call for  results once available. Also recommend evaluation with echocardiogram to evaluate possible left ventricular hypertrophy.

## 2014-04-13 NOTE — Telephone Encounter (Signed)
PA obtained for 2d ECHO. AUth # [id number] exp. 05/12/14.  Meyer CoryMisty Ahmad, LPN

## 2014-04-14 ENCOUNTER — Encounter: Payer: Self-pay | Admitting: *Deleted

## 2014-04-14 LAB — CBC WITH DIFFERENTIAL/PLATELET
BASOS PCT: 0 % (ref 0–1)
Basophils Absolute: 0 10*3/uL (ref 0.0–0.1)
Eosinophils Absolute: 0.1 10*3/uL (ref 0.0–0.7)
Eosinophils Relative: 1 % (ref 0–5)
HEMATOCRIT: 40.4 % (ref 39.0–52.0)
HEMOGLOBIN: 14.1 g/dL (ref 13.0–17.0)
Lymphocytes Relative: 42 % (ref 12–46)
Lymphs Abs: 3.4 10*3/uL (ref 0.7–4.0)
MCH: 32.8 pg (ref 26.0–34.0)
MCHC: 34.9 g/dL (ref 30.0–36.0)
MCV: 94 fL (ref 78.0–100.0)
MONO ABS: 0.7 10*3/uL (ref 0.1–1.0)
MONOS PCT: 8 % (ref 3–12)
Neutro Abs: 4 10*3/uL (ref 1.7–7.7)
Neutrophils Relative %: 49 % (ref 43–77)
Platelets: 298 10*3/uL (ref 150–400)
RBC: 4.3 MIL/uL (ref 4.22–5.81)
RDW: 13.1 % (ref 11.5–15.5)
WBC: 8.2 10*3/uL (ref 4.0–10.5)

## 2014-04-14 LAB — CK TOTAL AND CKMB (NOT AT ARMC)
CK, MB: 2.4 ng/mL (ref 0.0–5.0)
Relative Index: 1.2 (ref 0.0–4.0)
Total CK: 197 U/L (ref 7–232)

## 2014-04-14 LAB — COMPLETE METABOLIC PANEL WITH GFR
ALT: 14 U/L (ref 0–53)
AST: 22 U/L (ref 0–37)
Albumin: 4.3 g/dL (ref 3.5–5.2)
Alkaline Phosphatase: 68 U/L (ref 39–117)
BUN: 21 mg/dL (ref 6–23)
CALCIUM: 9.3 mg/dL (ref 8.4–10.5)
CHLORIDE: 104 meq/L (ref 96–112)
CO2: 26 mEq/L (ref 19–32)
Creat: 1.04 mg/dL (ref 0.50–1.35)
GFR, Est African American: >89 mL/min
GFR, Est Non African American: 88 mL/min
GLUCOSE: 98 mg/dL (ref 70–99)
POTASSIUM: 4.5 meq/L (ref 3.5–5.3)
SODIUM: 137 meq/L (ref 135–145)
TOTAL PROTEIN: 7.2 g/dL (ref 6.0–8.3)
Total Bilirubin: 0.5 mg/dL (ref 0.2–1.2)

## 2014-04-14 LAB — TSH: TSH: 1.556 u[IU]/mL (ref 0.350–4.500)

## 2014-04-14 LAB — TROPONIN I: Troponin I: <0.01 ng/mL (ref <0.06)

## 2014-04-19 ENCOUNTER — Ambulatory Visit
Admission: RE | Admit: 2014-04-19 | Discharge: 2014-04-19 | Disposition: A | Discharge status: Home / Self Care | Payer: BC Managed Care – PPO | Source: Ambulatory Visit | Attending: Sports Medicine | Admitting: Sports Medicine

## 2014-04-19 MED ORDER — IOHEXOL 300 MG/ML  SOLN
1.0000 mL | Freq: Once | INTRAMUSCULAR | Status: AC | PRN
  Administered 2014-04-19: 1 mL via EPIDURAL

## 2014-04-19 MED ORDER — TRIAMCINOLONE ACETONIDE 40 MG/ML IJ SUSP (RADIOLOGY)
60.0000 mg | Freq: Once | INTRAMUSCULAR | Status: AC
  Administered 2014-04-19: 60 mg via EPIDURAL

## 2014-04-19 NOTE — Discharge Instructions (Signed)

## 2014-04-20 ENCOUNTER — Encounter: Payer: Self-pay | Admitting: Sports Medicine

## 2014-04-20 MED ORDER — HYDROXYZINE HCL 50 MG PO TABS: 50.0000 mg | ORAL_TABLET | Freq: Every day | ORAL | Status: DC

## 2014-04-27 ENCOUNTER — Encounter: Payer: Self-pay | Admitting: Family Medicine

## 2014-04-27 ENCOUNTER — Encounter: Payer: Self-pay | Admitting: Sports Medicine

## 2014-04-27 ENCOUNTER — Ambulatory Visit (INDEPENDENT_AMBULATORY_CARE_PROVIDER_SITE_OTHER): Payer: BC Managed Care – PPO | Admitting: Sports Medicine

## 2014-04-27 ENCOUNTER — Ambulatory Visit (INDEPENDENT_AMBULATORY_CARE_PROVIDER_SITE_OTHER): Payer: BC Managed Care – PPO | Admitting: Family Medicine

## 2014-04-27 VITALS — BP 132/85 | HR 73 | Ht 67.0 in | Wt 149.0 lb

## 2014-04-27 DIAGNOSIS — R0789 Other chest pain: Secondary | ICD-10-CM

## 2014-04-27 DIAGNOSIS — M5412 Radiculopathy, cervical region: Secondary | ICD-10-CM

## 2014-04-27 NOTE — Progress Notes (Signed)
   Subjective:    Patient ID: Gabriel PellegriniKenneth Juarez, male    DOB: 12/04/1971, 43 y.o.   MRN: [id number]  HPI Here for followup atypical chest pain-he actually says his symptoms have been better since I law saw him. He finally had a couple episodes. And they seem to be much more brief. Still triggered with laughing. Still no shortness of breath, cold symptoms, fever, nausea or GI symptoms. It doesn't persist. His last a few minutes and then eases off. His blood pressure still well controlled. We had originally scheduled him for an echocardiogram because of some left ventricular hypertrophy seen on EKG. It could just be a normal variant for him but I wanted to make sure that he didn't truly have enlargement. Unfortunately he was never contacted about the echocardiogram.   Review of Systems     Objective:   Physical Exam  Constitutional: He is oriented to person, place, and time. He appears well-developed and well-nourished.  HENT:  Head: Normocephalic and atraumatic.  Cardiovascular: Normal rate, regular rhythm and normal heart sounds.   Pulmonary/Chest: Effort normal and breath sounds normal.  Neurological: He is alert and oriented to person, place, and time.  Skin: Skin is warm and dry.  Psychiatric: He has a normal mood and affect. His behavior is normal.          Assessment & Plan:  Atypical chest pain-still unclear etiology but suspect it is most likely musculoskeletal in nature. I apologize for not having been contacted about the echocardiogram that we will get him an appointment before he leaves the office today and get this scheduled and call him with the results once available. The symptoms do seem to be improving which I think is reassuring and his blood pressure looks fantastic today. He normally does a great job and being consistent with his medications and taking his blood pressure.

## 2014-04-27 NOTE — Progress Notes (Signed)
  Subjective:    CC: Follow up after epidural  HPI: Gabriel Juarez has multilevel degenerative disc disease in the cervical spine with cervical radiculitis on the left side, finally, after extensive workup, a left-sided cervical epidural provided 70% axial relief and 100 percent radicular relief.  Past medical history, Surgical history, Family history not pertinant except as noted below, Social history, Allergies, and medications have been entered into the medical record, reviewed, and no changes needed.   Review of Systems: No fevers, chills, night sweats, weight loss, chest pain, or shortness of breath.   Objective:    General: Well Developed, well nourished, and in no acute distress.  Neuro: Alert and oriented x3, extra-ocular muscles intact, sensation grossly intact.  HEENT: Normocephalic, atraumatic, pupils equal round reactive to light, neck supple, no masses, no lymphadenopathy, thyroid nonpalpable.  Skin: Warm and dry, no rashes. Cardiac: Regular rate and rhythm, no murmurs rubs or gallops, no lower extremity edema.  Respiratory: Clear to auscultation bilaterally. Not using accessory muscles, speaking in full sentences. Neck: Inspection unremarkable. No palpable stepoffs. Negative Spurling's maneuver. Full neck range of motion Grip strength and sensation normal in bilateral hands Strength good C4 to T1 distribution No sensory change to C4 to T1 Negative Hoffman sign bilaterally Reflexes normal  Impression and Recommendations:

## 2014-04-27 NOTE — Assessment & Plan Note (Signed)
After an extensive workup, a cervical epidural has finally resolved 70% of his axial symptoms and 100% of his radicular symptoms. Return as needed. We can do a total of 3 epidurals in the six-month period.

## 2014-05-05 ENCOUNTER — Ambulatory Visit (HOSPITAL_BASED_OUTPATIENT_CLINIC_OR_DEPARTMENT_OTHER): Payer: BC Managed Care – PPO

## 2014-05-05 ENCOUNTER — Ambulatory Visit (HOSPITAL_BASED_OUTPATIENT_CLINIC_OR_DEPARTMENT_OTHER)
Admission: RE | Admit: 2014-05-05 | Discharge: 2014-05-05 | Disposition: A | Discharge status: Home / Self Care | Payer: BC Managed Care – PPO | Source: Ambulatory Visit | Attending: Family Medicine | Admitting: Family Medicine

## 2014-05-05 DIAGNOSIS — R0789 Other chest pain: Secondary | ICD-10-CM | POA: Insufficient documentation

## 2014-05-05 DIAGNOSIS — R9431 Abnormal electrocardiogram [ECG] [EKG]: Secondary | ICD-10-CM

## 2014-05-05 NOTE — Progress Notes (Signed)
  Echocardiogram 2D Echocardiogram has been performed.  Real ConsChristy F Zai Juarez 05/05/2014, 10:27 AM

## 2014-05-16 ENCOUNTER — Other Ambulatory Visit: Payer: Self-pay | Admitting: Family Medicine

## 2014-07-14 ENCOUNTER — Encounter: Payer: Self-pay | Admitting: Family Medicine

## 2014-07-18 ENCOUNTER — Other Ambulatory Visit: Payer: Self-pay | Admitting: Family Medicine

## 2014-07-19 ENCOUNTER — Encounter: Payer: Self-pay | Admitting: Family Medicine

## 2014-07-20 ENCOUNTER — Other Ambulatory Visit: Payer: Self-pay | Admitting: *Deleted

## 2014-07-20 MED ORDER — AMLODIPINE BESYLATE 5 MG PO TABS: ORAL_TABLET | ORAL | Status: DC

## 2014-12-20 ENCOUNTER — Encounter: Payer: Self-pay | Admitting: Family Medicine

## 2014-12-21 ENCOUNTER — Other Ambulatory Visit: Payer: Self-pay

## 2014-12-21 MED ORDER — AMLODIPINE BESYLATE 5 MG PO TABS: ORAL_TABLET | ORAL | Status: DC

## 2015-01-27 ENCOUNTER — Other Ambulatory Visit: Payer: Self-pay | Admitting: Family Medicine

## 2015-01-29 ENCOUNTER — Other Ambulatory Visit: Payer: Self-pay | Admitting: Sports Medicine

## 2015-03-07 ENCOUNTER — Encounter (HOSPITAL_BASED_OUTPATIENT_CLINIC_OR_DEPARTMENT_OTHER): Payer: Self-pay | Admitting: Emergency Medicine

## 2015-03-07 ENCOUNTER — Emergency Department (HOSPITAL_BASED_OUTPATIENT_CLINIC_OR_DEPARTMENT_OTHER): Payer: Self-pay

## 2015-03-07 ENCOUNTER — Emergency Department (HOSPITAL_BASED_OUTPATIENT_CLINIC_OR_DEPARTMENT_OTHER)
Admission: EM | Admit: 2015-03-07 | Discharge: 2015-03-07 | Disposition: A | Discharge status: Home / Self Care | Payer: Self-pay | Attending: Emergency Medicine | Admitting: Emergency Medicine

## 2015-03-07 DIAGNOSIS — I10 Essential (primary) hypertension: Secondary | ICD-10-CM | POA: Insufficient documentation

## 2015-03-07 DIAGNOSIS — Z79899 Other long term (current) drug therapy: Secondary | ICD-10-CM | POA: Insufficient documentation

## 2015-03-07 DIAGNOSIS — M25522 Pain in left elbow: Secondary | ICD-10-CM | POA: Insufficient documentation

## 2015-03-07 MED ORDER — TRAMADOL HCL 50 MG PO TABS
50.0000 mg | ORAL_TABLET | Freq: Four times a day (QID) | ORAL | Status: DC | PRN
Start: 2015-03-07 — End: 2015-06-28

## 2015-03-07 MED ORDER — PREDNISONE 10 MG PO TABS: 20.0000 mg | ORAL_TABLET | Freq: Two times a day (BID) | ORAL | Status: DC

## 2015-03-07 NOTE — Discharge Instructions (Signed)
°  Prednisone as prescribed. Tramadol as prescribed as needed for pain.  Follow-up with Dr. Pearletha ForgeHudnall in the sports medicine clinic upstairs from here. Call his office to arrange this appointment. His contact information has been provided in this discharge summary.   Musculoskeletal Pain Musculoskeletal pain is muscle and boney aches and pains. These pains can occur in any part of the body. Your caregiver may treat you without knowing the cause of the pain. They may treat you if blood or urine tests, X-rays, and other tests were normal.  CAUSES There is often not a definite cause or reason for these pains. These pains may be caused by a type of germ (virus). The discomfort may also come from overuse. Overuse includes working out too hard when your body is not fit. Boney aches also come from weather changes. Bone is sensitive to atmospheric pressure changes. HOME CARE INSTRUCTIONS   Ask when your test results will be ready. Make sure you get your test results.  Only take over-the-counter or prescription medicines for pain, discomfort, or fever as directed by your caregiver. If you were given medications for your condition, do not drive, operate machinery or power tools, or sign legal documents for 24 hours. Do not drink alcohol. Do not take sleeping pills or other medications that may interfere with treatment.  Continue all activities unless the activities cause more pain. When the pain lessens, slowly resume normal activities. Gradually increase the intensity and duration of the activities or exercise.  During periods of severe pain, bed rest may be helpful. Lay or sit in any position that is comfortable.  Putting ice on the injured area.  Put ice in a bag.  Place a towel between your skin and the bag.  Leave the ice on for 15 to 20 minutes, 3 to 4 times a day.  Follow up with your caregiver for continued problems and no reason can be found for the pain. If the pain becomes worse or does not  go away, it may be necessary to repeat tests or do additional testing. Your caregiver may need to look further for a possible cause. SEEK IMMEDIATE MEDICAL CARE IF:  You have pain that is getting worse and is not relieved by medications.  You develop chest pain that is associated with shortness or breath, sweating, feeling sick to your stomach (nauseous), or throw up (vomit).  Your pain becomes localized to the abdomen.  You develop any new symptoms that seem different or that concern you. MAKE SURE YOU:   Understand these instructions.  Will watch your condition.  Will get help right away if you are not doing well or get worse. Document Released: 12/03/2005 Document Revised: 02/25/2012 Document Reviewed: 08/07/2013 [facility] Patient Information 2015 [employer] [place] [place]. This information is not intended to replace advice given to you by your health care provider. Make sure you discuss any questions you have with your health care provider.

## 2015-03-07 NOTE — ED Provider Notes (Signed)
CSN: [id number]     Arrival date & time 03/07/15  0527 History   First MD Initiated Contact with Patient 03/07/15 0554     Chief Complaint  Patient presents with  . Elbow Pain     (Consider location/radiation/quality/duration/timing/severity/associated sxs/prior Treatment) HPI Comments: Patient is a 44 year old male with history of hypertension. He presents today for evaluation of elbow pain which is been present for over one year. He was seen here about that time and diagnosed with nerve impingement. He was supposed to see a sports therapist but I am uncertain as to whether this happened. He states his pain was more severe this morning when he woke up. He feels it is swollen. He denies any new injury or trauma.  The history is provided by the patient.    Past Medical History  Diagnosis Date  . Hypertension    Past Surgical History  Procedure Laterality Date  . No past surgeries     Family History  Problem Relation Age of Onset  . Multiple sclerosis Mother   . Multiple sclerosis Brother    History  Substance Use Topics  . Smoking status: Never Smoker   . Smokeless tobacco: Not on file  . Alcohol Use: No    Review of Systems  All other systems reviewed and are negative.     Allergies  Review of patient's allergies indicates no known allergies.  Home Medications   Prior to Admission medications   Medication Sig Start Date End Date Taking? Authorizing Provider  amLODipine (NORVASC) 5 MG tablet TAKE 1 TABLET (5 MG TOTAL) BY MOUTH DAILY. 12/21/14   Agapito Gamesatherine D Metheney, MD  hydrOXYzine (ATARAX/VISTARIL) 50 MG tablet Take 1 tablet (50 mg total) by mouth at bedtime. 04/20/14   Monica Bectonhomas J Thekkekandam, MD  traMADol (ULTRAM) 50 MG tablet 1-2 tabs by mouth Q8 hours, maximum 6 tabs per day. 04/13/14   Monica Bectonhomas J Thekkekandam, MD   BP 141/86 mmHg  Pulse 72  Temp(Src) 98.2 F (36.8 C) (Oral)  Resp 16  Wt 160 lb (72.576 kg)  SpO2 99% Physical Exam  Constitutional: He is oriented  to person, place, and time. He appears well-developed and well-nourished. No distress.  HENT:  Head: Normocephalic and atraumatic.  Neck: Normal range of motion. Neck supple.  Musculoskeletal: Normal range of motion.  The left elbow appears grossly normal. There is tenderness to palpation over the medial epicondyle, but no crepitus or significant discomfort with range of motion. Distal ulnar and radial pulses are easily palpable. He is able to flex, extend, and oppose all fingers.  Neurological: He is alert and oriented to person, place, and time.  Skin: Skin is warm and dry. He is not diaphoretic.  Nursing note and vitals reviewed.   ED Course  Procedures (including critical care time) Labs Review Labs Reviewed - No data to display  Imaging Review No results found.   EKG Interpretation None      MDM   Final diagnoses:  None    Patient is a 44 year old male who presents with left elbow pain that has been present for over one year. He has no specific injury or trauma. He presents today because the pain has worsened recently in the absence of any injury or trauma. His x-rays today are unremarkable. I feel as though he is more than appropriate for discharge. He will be treated with prednisone and tramadol and I will give the follow-up information for Dr. Pearletha ForgeHudnall in the sports medicine clinic with whom he  can follow-up.    Geoffery Lyons, MD 03/07/15 [phone]

## 2015-03-07 NOTE — ED Notes (Signed)
Pt reports left elbow pain and edema that is intermittent in nature over the last year [facility]. Pt also states has seen by Dr Michaelle BirksMethany his PMD who referred him to Dr T a sport therapist who dx nerve pain. Pt awoke this AM with severe pain that increases with palpation and mild edema. Denies event or injury at present time or remotely.

## 2015-06-28 ENCOUNTER — Ambulatory Visit (INDEPENDENT_AMBULATORY_CARE_PROVIDER_SITE_OTHER): Payer: Managed Care, Other (non HMO) | Admitting: Family Medicine

## 2015-06-28 ENCOUNTER — Encounter: Payer: Self-pay | Admitting: Family Medicine

## 2015-06-28 VITALS — BP 130/88 | HR 82 | Ht 67.0 in | Wt 166.0 lb

## 2015-06-28 DIAGNOSIS — I10 Essential (primary) hypertension: Secondary | ICD-10-CM | POA: Diagnosis not present

## 2015-06-28 DIAGNOSIS — M5412 Radiculopathy, cervical region: Secondary | ICD-10-CM

## 2015-06-28 MED ORDER — AMLODIPINE BESYLATE 5 MG PO TABS: ORAL_TABLET | ORAL | Status: DC

## 2015-06-28 NOTE — Progress Notes (Signed)
   Subjective:    Patient ID: Gabriel PellegriniKenneth Kitzmiller, male    DOB: 05/31/1971, 44 y.o.   MRN: [id number]  HPI Hypertension- Pt denies chest pain, SOB, dizziness, or heart palpitations.  Taking meds as directed w/o problems.  Denies medication side effects.  No regular exercise but has an active job.   History of left cervical radiculopathy. He saw a sports medicine doctor about a year ago after a full workup he had a cervical epidural. It actually improved his pain between 72-100%. He was told to return as needed.says has been bothering him again for the last couple month. The pain has been radiating down to his left arm again.   Review of Systems     Objective:   Physical Exam  Constitutional: He is oriented to person, place, and time. He appears well-developed and well-nourished.  HENT:  Head: Normocephalic and atraumatic.  Cardiovascular: Normal rate, regular rhythm and normal heart sounds.   Pulmonary/Chest: Effort normal and breath sounds normal.  Neurological: He is alert and oriented to person, place, and time.  Skin: Skin is warm and dry.  Psychiatric: He has a normal mood and affect. His behavior is normal.          Assessment & Plan:  HTN - well controlled. F/U in 6 month.    Left cervical radiculopathy-he did get relief with the epidural for almost a year which I think is a fantastic response. Encouraged him to schedule another point with Dr. Benjamin Stainhekkekandam since it has been over a year since he's been seen for a second injection.

## 2015-08-30 ENCOUNTER — Encounter: Payer: Self-pay | Admitting: Sports Medicine

## 2015-08-30 ENCOUNTER — Ambulatory Visit (INDEPENDENT_AMBULATORY_CARE_PROVIDER_SITE_OTHER): Payer: Managed Care, Other (non HMO) | Admitting: Sports Medicine

## 2015-08-30 DIAGNOSIS — M5412 Radiculopathy, cervical region: Secondary | ICD-10-CM

## 2015-08-30 MED ORDER — MELOXICAM 15 MG PO TABS: ORAL_TABLET | ORAL | Status: DC

## 2015-08-30 MED ORDER — CYCLOBENZAPRINE HCL 10 MG PO TABS: ORAL_TABLET | ORAL | Status: DC

## 2015-08-30 NOTE — Assessment & Plan Note (Signed)
15 month response to previous left cervical epidural, at this point we are going to hit him with everything again, an additional left-sided cervical epidural, physical therapy, a muscle relaxer at bedtime and meloxicam.  Return to see me in one month to further evaluate response, again he can have 6 total epidurals per year, and he is only needed one every 15 months.

## 2015-08-30 NOTE — Progress Notes (Signed)
  Subjective:    CC: Follow-up  HPI: Left cervical radiculopathy: Did extremely well after left-sided cervical epidural at the C7-T1 level 15 months ago, now having recurrence of pain, moderate, persistent, radiation down the left arm, desires to restart everything including physical therapy and epidural. No bowel or bladder dysfunction, no lower extremity symptoms, no constitutional symptoms, no trauma.  Past medical history, Surgical history, Family history not pertinant except as noted below, Social history, Allergies, and medications have been entered into the medical record, reviewed, and no changes needed.   Review of Systems: No fevers, chills, night sweats, weight loss, chest pain, or shortness of breath.   Objective:    General: Well Developed, well nourished, and in no acute distress.  Neuro: Alert and oriented x3, extra-ocular muscles intact, sensation grossly intact.  HEENT: Normocephalic, atraumatic, pupils equal round reactive to light, neck supple, no masses, no lymphadenopathy, thyroid nonpalpable.  Skin: Warm and dry, no rashes. Cardiac: Regular rate and rhythm, no murmurs rubs or gallops, no lower extremity edema.  Respiratory: Clear to auscultation bilaterally. Not using accessory muscles, speaking in full sentences. Neck: Negative spurling's Full neck range of motion Grip strength and sensation normal in bilateral hands Strength good C4 to T1 distribution No sensory change to C4 to T1 Reflexes normal  Impression and Recommendations:    I spent 25 minutes with this patient, greater than 50% was face-to-face time counseling regarding the above diagnoses

## 2015-09-05 ENCOUNTER — Ambulatory Visit: Payer: Managed Care, Other (non HMO) | Admitting: Physical Therapy

## 2015-09-14 ENCOUNTER — Ambulatory Visit (INDEPENDENT_AMBULATORY_CARE_PROVIDER_SITE_OTHER): Payer: Managed Care, Other (non HMO) | Admitting: Physical Therapy

## 2015-09-14 ENCOUNTER — Encounter: Payer: Self-pay | Admitting: Physical Therapy

## 2015-09-14 DIAGNOSIS — R29898 Other symptoms and signs involving the musculoskeletal system: Secondary | ICD-10-CM | POA: Diagnosis not present

## 2015-09-14 DIAGNOSIS — M436 Torticollis: Secondary | ICD-10-CM | POA: Diagnosis not present

## 2015-09-14 DIAGNOSIS — M542 Cervicalgia: Secondary | ICD-10-CM | POA: Diagnosis not present

## 2015-09-14 NOTE — Therapy (Signed)
[facility] [place] [address] [place], [place], [address] Phone: [phone]   Fax:  [phone]  Physical Therapy Evaluation  Patient Details  Name: Gabriel Juarez MRN: [id number] Date of Birth: [birth year] Referring Provider:  Monica Becton,*  Encounter Date: 09/14/2015      PT End of Session - 09/14/15 1642    Visit Number 1   Number of Visits 8   Date for PT Re-Evaluation 10/12/15   PT Start Time 1549   PT Stop Time 1651   PT Time Calculation (min) 62 min   Activity Tolerance Patient tolerated treatment well      Past Medical History  Diagnosis Date  . Hypertension     Past Surgical History  Procedure Laterality Date  . No past surgeries      There were no vitals filed for this visit.  Visit Diagnosis:  Stiffness of neck - Plan: PT plan of care cert/re-cert  Pain, neck - Plan: PT plan of care cert/re-cert  Weakness of left arm - Plan: PT plan of care cert/re-cert      Subjective Assessment - 09/14/15 1557    Pertinent History placed back on melxicam and muscle relaxers   How long can you sit comfortably? tolerates 1 hr   Diagnostic tests nothing new   Patient Stated Goals get ready for epidural and get through the busy season.    Currently in Pain? Yes   Pain Score 5    Pain Location Knee   Pain Orientation Left   Pain Descriptors / Indicators Aching;Shooting   Pain Type Chronic pain   Pain Onset More than a month ago   Pain Frequency Constant   Aggravating Factors  sleeping, sitting with head forward   Pain Relieving Factors repositioning constant            [facility] PT Assessment - 09/14/15 0001    Assessment   Medical Diagnosis cervical radiculopathy   Onset Date/Surgical Date 03/14/15   Hand Dominance Right   Next MD Visit 10/12/15   Prior Therapy about 15 months ago   Precautions   Precautions None   Balance Screen   Has the patient fallen in the past 6 months No   Has the  patient had a decrease in activity level because of a fear of falling?  No   Is the patient reluctant to leave their home because of a fear of falling?  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    Cervical Flexion WNL   Cervical Extension decreased 25% pain at endrange   Cervical - Right Rotation 82   Cervical - Left Rotation 53  68 degrees after, PROM of neck.    Strength   Strength Assessment Site Shoulder;Elbow;Hand   Right/Left Shoulder Left  Rt WNL   Left Shoulder Flexion 4/5   Left Shoulder Extension 5/5   Left Shoulder ABduction 5/5   Left Shoulder Internal Rotation 5/5  Left Shoulder External Rotation 4+/5   Right/Left Elbow --  Rt WNL, Lt 4+/5   Right/Left hand Left;Right   Right Hand Grip (lbs) 105   Left Hand Grip (lbs) 70   Flexibility   Soft Tissue Assessment /Muscle Length --  tigth pecs   Palpation   Spinal mobility hypomobile in C & T spine   Palpation comment tight in bilat UT/levator/pecs   Special Tests    Special Tests Cervical   Cervical Tests Spurling's   Spurling's   Findings Positive   Side Left                   [facility] Adult PT Treatment/Exercise - 09/14/15 0001    Exercises   Exercises Neck   Neck Exercises: Standing   Other Standing Exercises doorway stretch arms mid range   Neck Exercises: Seated   Neck Retraction 10 reps   Cervical Rotation Left;10 reps  self mobs   Modalities    Modalities Electrical Stimulation;Cryotherapy   Cryotherapy   Number Minutes Cryotherapy 15 Minutes   Cryotherapy Location Cervical   Type of Cryotherapy Ice pack   Electrical Stimulation   Electrical Stimulation Location cervical    Electrical Stimulation Action IFC   Electrical Stimulation Parameters to tolerance   Electrical Stimulation Goals Pain   Manual Therapy   Manual Therapy Joint mobilization;Passive ROM;Soft tissue mobilization   Joint Mobilization c-spine, with focus on Lt UPA C3, pt with no improvment in ROM after this or pain decrease   Soft tissue mobilization Lt upper trap,/levator/periscapular   Passive ROM cervical in all directions, Pt with improved ROM after this.                 PT Education - 09/14/15 1642    Education provided Yes   Education Details HEP   Person(s) Educated Patient   Methods Explanation;Demonstration;Handout   Comprehension Returned demonstration;Verbalized understanding             PT Long Term Goals - 09/14/15 1645    PT LONG TERM GOAL #1   Title I with advanced HEP ( 10/12/15)    Time 4   Period Weeks   Status New   PT LONG TERM GOAL #2   Title improve Lt cervical rotation =/> 65 degrees without pain ( 10/12/15)    Time 4   Period Weeks   Status New   PT LONG TERM GOAL #3   Title report pain return to baseline to allow improved sleeping ( 10/12/15)    Time 4   Period Weeks   Status New   PT LONG TERM GOAL #4   Title improve FOTO =/< 35% ( 10/12/15)    Time 4   Period Weeks   Status New   PT LONG TERM GOAL #5   Title improve strength Lt grip =/> 85# ( 10/12/15)    Time 4   Period Weeks   Status New               Plan - 09/14/15 1643    Clinical Impression Statement 44 yo male well known to therapist from treatment last year. He presents with returning symptoms into his Lt UE, also has some weakness in that arm.  Pain is interfering with his sleep and ability to work at times. He is going to have an  injection sometime in the near future, it is not scheduled yet.    Pt will benefit from skilled therapeutic intervention in order to  improve on the following deficits Postural dysfunction;Decreased strength;Hypomobility;Pain;Increased muscle spasms;Decreased range of motion   Rehab Potential Good   PT Frequency 2x / week   PT Duration 4 weeks   PT Treatment/Interventions Manual techniques;Therapeutic exercise;Moist Heat;Electrical Stimulation;Cryotherapy;Dry needling;Passive range of motion;Patient/family education;Ultrasound;Traction   PT Next Visit Plan manual therapy, mobs/STW/PROM neck, possible dry needling and traction.    Consulted and Agree with Plan of Care Patient         Problem List Patient Active Problem List   Diagnosis Date Noted  . Left Cervical radiculopathy 01/04/2014  . Axial low back pain 05/14/2013  . Essential hypertension, benign 10/01/2012    Roderic Scarce PT 09/14/2015, 4:54 PM  [facility] [address] [place], [place], [address] Phone: [phone]   Fax:  [phone]

## 2015-09-14 NOTE — Patient Instructions (Signed)
Axial Extension (Chin Tuck)   [place] [phone]   Pull chin in and lengthen back of neck. Hold _2___ seconds while counting out loud. Repeat 10_ times. Do 4-5____ sessions per day.    Upper Cervical Rotation Mobilization   With left hand firmly supporting neck, and border of hand at first palpable vertebra in neck, slowly rotate head toward arm. Repeat __3-5__ times per set. Do __1__ sets per session. Do _2___ sessions per day. Move hand up and down vertebra.  Gradually increase speed as coordination improves.  Scapula Adduction With Pectorals, Mid-Range   Stand in doorframe with palms against frame and arms at 90. Lean forward and squeeze shoulder blades. Hold ___ seconds. Repeat ___ times per session. Do ___ sessions per day.  Copyright  VHI. All rights reserved.

## 2015-09-15 ENCOUNTER — Encounter: Payer: Self-pay | Admitting: Sports Medicine

## 2015-09-19 MED ORDER — TRAMADOL HCL 50 MG PO TABS: ORAL_TABLET | ORAL | Status: DC

## 2015-09-19 NOTE — Addendum Note (Signed)
Addended by: Monica Becton on: 09/19/2015 01:13 PM   Modules accepted: Orders

## 2015-09-21 ENCOUNTER — Ambulatory Visit (INDEPENDENT_AMBULATORY_CARE_PROVIDER_SITE_OTHER): Payer: Managed Care, Other (non HMO) | Admitting: Physical Therapy

## 2015-09-21 DIAGNOSIS — R29898 Other symptoms and signs involving the musculoskeletal system: Secondary | ICD-10-CM | POA: Diagnosis not present

## 2015-09-21 DIAGNOSIS — M436 Torticollis: Secondary | ICD-10-CM | POA: Diagnosis not present

## 2015-09-21 DIAGNOSIS — M542 Cervicalgia: Secondary | ICD-10-CM

## 2015-09-21 NOTE — Therapy (Addendum)
[place] [address] [place], [place], [address] Phone: [phone]   Fax:  [phone]  Physical Therapy Treatment  Patient Details  Name: Gabriel Juarez MRN: [id number] Date of Birth: 1971/05/04 Referring Provider:  Silverio Decamp,*  Encounter Date: 09/21/2015      PT End of Session - 09/21/15 1606    Visit Number 2   Number of Visits 8   Date for PT Re-Evaluation 10/12/15   PT Start Time 1604   PT Stop Time 4270   PT Time Calculation (min) 50 min   Activity Tolerance Patient limited by pain      Past Medical History  Diagnosis Date  . Hypertension     Past Surgical History  Procedure Laterality Date  . No past surgeries      There were no vitals filed for this visit.  Visit Diagnosis:  Stiffness of neck  Weakness of left arm  Pain, neck      Subjective Assessment - 09/21/15 1606    Subjective Pt reports his pain is now radiating down to fingers. Not getting much sleep; feels medication is not providing any relief. has been performing HEP each day. Feeling much worse than last week.    Currently in Pain? Yes   Pain Score 6    Pain Location Neck   Pain Orientation Left   Pain Descriptors / Indicators Constant;Sharp;Shooting   Pain Radiating Towards down to tips of fingers    Aggravating Factors  sleeping, sitting with head forward.    Pain Relieving Factors repositioning             [facility] PT Assessment - 09/21/15 0001    Assessment   Medical Diagnosis cervical radiculopathy   Onset Date/Surgical Date 03/14/15   Hand Dominance Right   Next MD Visit 10/12/15   Prior Therapy about 15 months ago      Cervical lateral flexion:   Rt 50 deg, Lt 25 deg with pain on Lt.      After manual therapy, 30 deg with Lt lateral flexion, Rt unchanged.        [facility] Adult PT Treatment/Exercise - 09/21/15 0001    Exercises   Exercises Neck;Shoulder   Neck Exercises: Machines for  Strengthening   UBE (Upper Arm Bike) L1: 2 min forward, 2 min back    Neck Exercises: Seated   Neck Retraction 5 reps   Shoulder Rolls 5 reps   Other Seated Exercise scap squeeze x 5 reps   Neck Exercises: Supine   Cervical Isometrics 10 reps;3 secs  (head presses)   Neck Retraction 10 reps   Neck Retraction Limitations (tactile cues to correct form)   Other Supine Exercise scap squeeze  x 10 reps    Other Supine Exercise Hooklying on full foam roll:  shoulder abd x 60 sec, then 10 slow snow angels.    Modalities   Modalities Traction   Traction   Type of Traction Cervical   Max (lbs) 8   Hold Time static    Time 10   Manual Therapy   Manual therapy comments biofreeze applied to Lt cervical paraspinals and upper trap   Soft tissue mobilization Edge tool used on Lt upper trap and cervical paraspinals to reduced restrictions and pain.    Neck Exercises: Stretches   Other Neck Stretches doorway stretch (mid level) x 30 sec each arm.  Stopped due to increased radicular symptoms.  PT Education - 09/21/15 1703    Education provided Yes   Education Details HEP :  snow angels supine, scap squeezes.   Self care: trial of towel in pillow to support curve in neck.    Person(s) Educated Patient   Methods Explanation   Comprehension Verbalized understanding;Returned demonstration             PT Long Term Goals - 09/14/15 1645    PT LONG TERM GOAL #1   Title I with advanced HEP ( 10/12/15)    Time 4   Period Weeks   Status New   PT LONG TERM GOAL #2   Title improve Lt cervical rotation =/> 65 degrees without pain ( 10/12/15)    Time 4   Period Weeks   Status New   PT LONG TERM GOAL #3   Title report pain return to baseline to allow improved sleeping ( 10/12/15)    Time 4   Period Weeks   Status New   PT LONG TERM GOAL #4   Title improve FOTO =/< 35% ( 10/12/15)    Time 4   Period Weeks   Status New   PT LONG TERM GOAL #5   Title improve  strength Lt grip =/> 85# ( 10/12/15)    Time 4   Period Weeks   Status New               Plan - 09/21/15 1657    Clinical Impression Statement Pt reported increased radicular symptoms with standing doorway stretch; reduced Lt UE radicular symptoms with supine chest stretch.  Pt tolerated cervical traction trial; slight decrease in pain.  Pt continues with decreased cervical ROM, painful. No goals met yet, only 2nd visit.    Pt will benefit from skilled therapeutic intervention in order to improve on the following deficits Postural dysfunction;Decreased strength;Hypomobility;Pain;Increased muscle spasms;Decreased range of motion   Rehab Potential Good   PT Frequency 2x / week   PT Duration 4 weeks   PT Treatment/Interventions Manual techniques;Therapeutic exercise;Moist Heat;Electrical Stimulation;Cryotherapy;Dry needling;Passive range of motion;Patient/family education;Ultrasound;Traction   PT Next Visit Plan Assess response to traction.  Dry needling trial, manual therapy. Continued postural strengthening/ stretching.    Consulted and Agree with Plan of Care Patient        Problem List Patient Active Problem List   Diagnosis Date Noted  . Left Cervical radiculopathy 01/04/2014  . Axial low back pain 05/14/2013  . Essential hypertension, benign 10/01/2012   Kerin Perna, PTA 09/21/2015 5:04 PM  [place] [address] [place], [place], [address] Phone: [phone]   Fax:  [phone]

## 2015-09-23 ENCOUNTER — Encounter: Payer: Managed Care, Other (non HMO) | Admitting: Rehabilitative and Restorative Service Providers"

## 2015-10-04 ENCOUNTER — Encounter: Payer: Self-pay | Admitting: Sports Medicine

## 2015-10-05 ENCOUNTER — Ambulatory Visit (INDEPENDENT_AMBULATORY_CARE_PROVIDER_SITE_OTHER): Payer: Managed Care, Other (non HMO) | Admitting: Rehabilitative and Restorative Service Providers"

## 2015-10-05 ENCOUNTER — Encounter: Payer: Self-pay | Admitting: Rehabilitative and Restorative Service Providers"

## 2015-10-05 DIAGNOSIS — R29898 Other symptoms and signs involving the musculoskeletal system: Secondary | ICD-10-CM

## 2015-10-05 DIAGNOSIS — M542 Cervicalgia: Secondary | ICD-10-CM | POA: Diagnosis not present

## 2015-10-05 DIAGNOSIS — M436 Torticollis: Secondary | ICD-10-CM | POA: Diagnosis not present

## 2015-10-05 NOTE — Patient Instructions (Signed)
Resisted External Rotation: in Neutral - Bilateral   PALMS UP Sit or stand, tubing in both hands, elbows at sides, bent to 90, forearms forward. Pinch shoulder blades together and rotate forearms out. Keep elbows at sides. Repeat __10__ times per set. Do _2-3___ sets per session. Do _2-3___ sessions per day.   Low Row: Standing   Face anchor, feet shoulder width apart. Palms up, pull arms back, squeezing shoulder blades together. Repeat 10__ times per set. Do 2-3__ sets per session. Do 2-3__ sessions per week. Anchor Height: Waist     Strengthening: Resisted Extension   Hold tubing in right hand, arm forward. Pull arm back, elbow straight. Repeat _10___ times per set. Do 2-3____ sets per session. Do 2-3____ sessions per day.   Shoulder Blade Squeeze    Rotate shoulders back, then squeeze shoulder blades down and back hold 10 sec. Repeat _10___ times. Do __several__ sessions per day.    Copyright  VHI. All rights reserved.

## 2015-10-05 NOTE — Therapy (Signed)
[facility] [address] [place], [place], [address] Phone: [phone]   Fax:  [phone]  Physical Therapy Treatment  Patient Details  Name: Gabriel PellegriniKenneth Gusler MRN: [id number] Date of Birth: 11/29/1971 No Data Recorded  Encounter Date: 10/05/2015      PT End of Session - 10/05/15 1547    Visit Number 3   Number of Visits 8   Date for PT Re-Evaluation 10/12/15   PT Start Time 1547   PT Stop Time 1652   PT Time Calculation (min) 65 min   Activity Tolerance Patient limited by pain      Past Medical History  Diagnosis Date  . Hypertension     Past Surgical History  Procedure Laterality Date  . No past surgeries      There were no vitals filed for this visit.  Visit Diagnosis:  Stiffness of neck  Weakness of left arm  Pain, neck      Subjective Assessment - 10/05/15 1548    Subjective Patient reports that he couldn't get to PT last week due to work. he is feeling 'sore'. he is still having the pain into the Lt arm which is now intermittent in nature. Intensity is about the same and the duration is the same. He does not notice any change in the Lt UE strength.    Currently in Pain? Yes   Pain Score 5    Pain Location Neck   Pain Orientation Left   Pain Descriptors / Indicators Sharp   Pain Type Chronic pain   Pain Radiating Towards radiating onto the Lt arm into all of his fingertips    Pain Onset More than a month ago   Pain Frequency Intermittent   Aggravating Factors  sleeping; sitting with head forward   Pain Relieving Factors changing positions            [facility] PT Assessment - 10/05/15 0001    Assessment   Medical Diagnosis cervical radiculopathy   Onset Date/Surgical Date 03/14/15   Hand Dominance Right   Next MD Visit 10/12/15   Prior Therapy about 15 months ago   Posture/Postural Control   Postural Limitations Rounded Shoulders;Increased lumbar lordosis   AROM   Cervical - Right  Rotation 85   Cervical - Left Rotation 56   Strength   Strength Assessment Site Shoulder   Left Shoulder Flexion 4/5   Left Shoulder Extension 5/5   Left Shoulder ABduction --  5-/5   Left Shoulder Internal Rotation 4+/5   Left Shoulder External Rotation 4+/5   Left Hand Grip (lbs) 70   Flexibility   Soft Tissue Assessment /Muscle Length --  tight ant/lat/post cervical mm; pecs bilat   Palpation   Spinal mobility hypomobile in C & T spine   Palpation comment tight in bilat UT/levator/pecs                     [facility] Adult PT Treatment/Exercise - 10/05/15 0001    Exercises   Exercises Neck;Shoulder   Neck Exercises: Machines for Strengthening   UBE (Upper Arm Bike) L1 4' 2 fwd/2 back   Neck Exercises: Standing   Neck Retraction 5 reps;5 secs   Other Standing Exercises doorway 3 positions 30 sec hold 3 reps   Other Standing Exercises scap squeeze 10 sec hold 10 reps    Neck Exercises: Supine   Neck Retraction 10 reps;5 secs   Shoulder Exercises: Seated   Extension Strengthening;Both;20 reps;Theraband  Theraband Level (Shoulder Extension) Level 1 (Yellow)   Retraction Strengthening;Both;20 reps;Theraband   Theraband Level (Shoulder Retraction) Level 1 (Yellow)   Row Strengthening;Both;20 reps;Theraband   Theraband Level (Shoulder Row) Level 1 (Yellow)   Modalities   Modalities Electrical Stimulation   Cryotherapy   Number Minutes Cryotherapy 15 Minutes   Cryotherapy Location Cervical   Type of Cryotherapy Ice pack   Electrical Stimulation   Electrical Stimulation Location cervical    Electrical Stimulation Action IFC   Electrical Stimulation Parameters to tolerance   Electrical Stimulation Goals Pain   Manual Therapy   Joint Mobilization cervical mobs mid c-spine bilat   Soft tissue mobilization deep tissue work through ant/lat/post cervical musculature   Myofascial Release chest anteriorly   Manual Traction cervical                PT Education  - 10/05/15 1612    Education provided Yes   Education Details cont postural education and correction; scap squeeze adding posterior shoulder girdle strengthening; HEP   Person(s) Educated Patient   Methods Explanation;Demonstration;Tactile cues;Verbal cues;Handout   Comprehension Verbalized understanding;Returned demonstration;Verbal cues required;Tactile cues required             PT Long Term Goals - 10/05/15 1655    PT LONG TERM GOAL #1   Title I with advanced HEP ( 10/12/15)    Time 4   Period Weeks   Status On-going   PT LONG TERM GOAL #2   Title improve Lt cervical rotation =/> 65 degrees without pain ( 10/12/15)    Time 4   Period Weeks   Status On-going   PT LONG TERM GOAL #3   Title report pain return to baseline to allow improved sleeping ( 10/12/15)    Time 4   Period Weeks   Status On-going   PT LONG TERM GOAL #4   Title improve FOTO =/< 35% ( 10/12/15)    Time 4   Period Weeks   Status On-going   PT LONG TERM GOAL #5   Title improve strength Lt grip =/> 85# ( 10/12/15)    Time 4   Period Weeks   Status On-going               Plan - 10/05/15 1652    Clinical Impression Statement Some improvement in radicular pain Lt UE but pt does cont to have radiating pain and numbness and notes cont weakness in Lt UE. He has Bruce Donath been for 3 PT visits and reports that he was unable to come in for treatment last week due to work schedule. He has not accomplished goals of therapy but would benefit form cont treatment with consistent schedule to address problems identified. May benefit from trial of dry needling.    Pt will benefit from skilled therapeutic intervention in order to improve on the following deficits Postural dysfunction;Decreased strength;Hypomobility;Pain;Increased muscle spasms;Decreased range of motion   Rehab Potential Good   PT Frequency 2x / week   PT Duration 4 weeks   PT Treatment/Interventions Manual techniques;Therapeutic exercise;Moist  Heat;Electrical Stimulation;Cryotherapy;Dry needling;Passive range of motion;Patient/family education;Ultrasound;Traction   PT Next Visit Plan Assess response to traction.  Dry needling trial. Continued postural strengthening/ stretching.    PT Home Exercise Plan postural correction; HEP    Consulted and Agree with Plan of Care Patient        Problem List Patient Active Problem List   Diagnosis Date Noted  . Left Cervical radiculopathy 01/04/2014  . Axial low back  pain 05/14/2013  . Essential hypertension, benign 10/01/2012    Celyn Rober Minion PT, MPH 10/05/2015, 5:01 PM  [facility] [address] [place], [place], [address] Phone: [phone]   Fax:  [phone]  Name: Wilmore Holsomback MRN: [id number] Date of Birth: 04-02-1971

## 2015-10-06 ENCOUNTER — Ambulatory Visit (INDEPENDENT_AMBULATORY_CARE_PROVIDER_SITE_OTHER): Payer: Managed Care, Other (non HMO) | Admitting: Physical Therapy

## 2015-10-06 DIAGNOSIS — M436 Torticollis: Secondary | ICD-10-CM | POA: Diagnosis not present

## 2015-10-06 DIAGNOSIS — M542 Cervicalgia: Secondary | ICD-10-CM | POA: Diagnosis not present

## 2015-10-06 DIAGNOSIS — R29898 Other symptoms and signs involving the musculoskeletal system: Secondary | ICD-10-CM

## 2015-10-06 NOTE — Therapy (Addendum)
[place] [facility] [place] [address] [place], [place], [address] Phone: [phone]   Fax:  [phone]  Physical Therapy Treatment  Patient Details  Name: Nieves Barberi MRN: [id number] Date of Birth: [birth year] No Data Recorded  Encounter Date: 10/06/2015      PT End of Session - 10/06/15 1451    Visit Number 4   Number of Visits 8   Date for PT Re-Evaluation 10/12/15   PT Start Time 2263   PT Stop Time 1542   PT Time Calculation (min) 53 min   Activity Tolerance Patient limited by pain      Past Medical History  Diagnosis Date  . Hypertension     Past Surgical History  Procedure Laterality Date  . No past surgeries      There were no vitals filed for this visit.  Visit Diagnosis:  Stiffness of neck  Weakness of left arm  Pain, neck      Subjective Assessment - 10/06/15 1451    Subjective Pt reports he has a tight feeling in his neck, "like the feeling in your knuckles that makes you want to pop them".  Pt reports he feels about the same as yesterday.  Can't seem to pin point what actions increase/ eliminate UE symptoms.    Currently in Pain? Yes   Pain Score 4    Pain Location Neck   Pain Orientation Left   Pain Descriptors / Indicators Tightness            [facility] PT Assessment - 10/06/15 0001    Assessment   Medical Diagnosis cervical radiculopathy   Onset Date/Surgical Date 03/14/15   Hand Dominance Right   Next MD Visit needs to schedule    Prior Therapy about 15 months ago           [facility] Adult PT Treatment/Exercise - 10/06/15 0001    Exercises   Exercises Neck;Shoulder   Neck Exercises: Machines for Strengthening   UBE (Upper Arm Bike) L2: alternating forward backward 61mn total   Shoulder Exercises: Standing   External Rotation Strengthening;Both;Theraband;20 reps  with scap squeeze around pool noodle.   Theraband Level (Shoulder External Rotation) Level 2 (Red)   Row Both;10  reps;Strengthening;Theraband  2 sets   Theraband Level (Shoulder Row) Level 2 (Red)   Row Limitations VC for form   Shoulder Exercises: Stretch   Other Shoulder Stretches Doorway stretches; 3 positions each 30 sec x 2 reps each position   Modalities   Modalities Cryotherapy;Electrical Stimulation;Traction   Cryotherapy   Number Minutes Cryotherapy 15 Minutes   Cryotherapy Location Cervical   Type of Cryotherapy Ice pack   Electrical Stimulation   Electrical Stimulation Location cervical paraspinals/ upper traps    Electrical Stimulation Action IFC    Electrical Stimulation Parameters to tolerance   Electrical Stimulation Goals Pain   Traction   Type of Traction Cervical   Max (lbs) 10   Hold Time static   Time 12   Manual Therapy   Manual Therapy Myofascial release   Myofascial Release chest anteriorly, scalenes, suboccipital release   Neck Exercises: Stretches   Upper Trapezius Stretch 2 reps;30 seconds  with towel anchor, each side   Levator Stretch 2 reps;30 seconds                PT Education - 10/05/15 1612    Education provided Yes   Education Details cont postural education and correction; scap squeeze adding posterior shoulder girdle strengthening; HEP  Person(s) Educated Patient   Methods Explanation;Demonstration;Tactile cues;Verbal cues;Handout   Comprehension Verbalized understanding;Returned demonstration;Verbal cues required;Tactile cues required             PT Long Term Goals - 10/05/15 1655    PT LONG TERM GOAL #1   Title I with advanced HEP ( 10/12/15)    Time 4   Period Weeks   Status On-going   PT LONG TERM GOAL #2   Title improve Lt cervical rotation =/> 65 degrees without pain ( 10/12/15)    Time 4   Period Weeks   Status On-going   PT LONG TERM GOAL #3   Title report pain return to baseline to allow improved sleeping ( 10/12/15)    Time 4   Period Weeks   Status On-going   PT LONG TERM GOAL #4   Title improve FOTO =/< 35% (  10/12/15)    Time 4   Period Weeks   Status On-going   PT LONG TERM GOAL #5   Title improve strength Lt grip =/> 85# ( 10/12/15)    Time 4   Period Weeks   Status On-going               Plan - 10/06/15 1531    Clinical Impression Statement Pt tolerated most exercises except levator/ upper trap stretch for Rt due to increased radicular symptoms.  Making slow progress towards goals.    Pt will benefit from skilled therapeutic intervention in order to improve on the following deficits Postural dysfunction;Decreased strength;Hypomobility;Pain;Increased muscle spasms;Decreased range of motion   Rehab Potential Good   PT Frequency 2x / week   PT Duration 4 weeks   PT Treatment/Interventions Manual techniques;Therapeutic exercise;Moist Heat;Electrical Stimulation;Cryotherapy;Dry needling;Passive range of motion;Patient/family education;Ultrasound;Traction   PT Next Visit Plan Assess response to traction.  Dry needling trial. Continued postural strengthening/ stretching.    Consulted and Agree with Plan of Care Patient        Problem List Patient Active Problem List   Diagnosis Date Noted  . Left Cervical radiculopathy 01/04/2014  . Axial low back pain 05/14/2013  . Essential hypertension, benign 10/01/2012    Kerin Perna, PTA 10/06/2015 6:05 PM   [facility] [address] [place], [place], [address] Phone: [phone]   Fax:  [phone]  Name: Dhanvin Szeto MRN: [id number] Date of Birth: 1971-01-07    PHYSICAL THERAPY DISCHARGE SUMMARY  Visits from Start of Care: 4  Current functional level related to goals / functional outcomes: Some improvement   Remaining deficits: Continued symptoms    Education / Equipment: HEP  Plan: Patient agrees to discharge.  Patient goals were partially met. Patient is being discharged due to not returning since the last visit.  ?????    Celyn P. Helene Kelp PT,  MPH 11/07/2015 9:34 AM

## 2015-10-12 ENCOUNTER — Encounter: Payer: Managed Care, Other (non HMO) | Admitting: Physical Therapy

## 2015-10-13 ENCOUNTER — Encounter: Payer: Self-pay | Admitting: Family Medicine

## 2015-10-13 ENCOUNTER — Ambulatory Visit (INDEPENDENT_AMBULATORY_CARE_PROVIDER_SITE_OTHER): Payer: Managed Care, Other (non HMO) | Admitting: Family Medicine

## 2015-10-13 VITALS — BP 138/90 | HR 83 | Temp 98.3°F | Resp 18 | Ht 67.0 in | Wt 170.6 lb

## 2015-10-13 DIAGNOSIS — Z0000 Encounter for general adult medical examination without abnormal findings: Secondary | ICD-10-CM

## 2015-10-13 DIAGNOSIS — I10 Essential (primary) hypertension: Secondary | ICD-10-CM | POA: Diagnosis not present

## 2015-10-13 DIAGNOSIS — G4700 Insomnia, unspecified: Secondary | ICD-10-CM

## 2015-10-13 DIAGNOSIS — Z0189 Encounter for other specified special examinations: Secondary | ICD-10-CM | POA: Diagnosis not present

## 2015-10-13 MED ORDER — TRAZODONE HCL 50 MG PO TABS: 100.0000 mg | ORAL_TABLET | Freq: Every evening | ORAL | Status: DC | PRN

## 2015-10-13 MED ORDER — AMLODIPINE BESYLATE 10 MG PO TABS: 10.0000 mg | ORAL_TABLET | Freq: Every day | ORAL | Status: DC

## 2015-10-13 NOTE — Patient Instructions (Signed)
Sent over new prescription for increased dose of amlodipine.

## 2015-10-13 NOTE — Progress Notes (Addendum)
Subjective:    Patient ID: Gabriel Juarez, male    DOB: 12-07-1971, 44 y.o.   MRN: [id number]  HPI Here for CPE - he is doing well. He would like to weigh 160 lbs. he otherwise has no concerns or specific complaints. No chest pain or short of breath. Palms with his bowels. He does report that he thinks his blood pressure may have been running more height recently. He says he typically gets a low-grade headache when his pressure is high and his been noticing that more often lately. He doesn't have a blood pressure cuff at home percent.    Review of Systems Comprehensive ROS is negative.   BP 138/90 mmHg  Pulse 83  Temp(Src) 98.3 F (36.8 C) (Oral)  Resp 18  Ht  (1.702 m)  Wt 170 lb 9.6 oz (77.384 kg)  BMI 26.71 kg/m2  SpO2 98%    No Known Allergies  Past Medical History  Diagnosis Date  . Hypertension     Past Surgical History  Procedure Laterality Date  . No past surgeries      Social History   Social History  . Marital Status: Single    Spouse Name: N/A  . Number of Children: N/A  . Years of Education: N/A   Occupational History  . supervisor Other   Social History Main Topics  . Smoking status: Never Smoker   . Smokeless tobacco: Not on file  . Alcohol Use: No  . Drug Use: No  . Sexual Activity: Not on file   Other Topics Concern  . Not on file   Social History Narrative   No regular exercise but his job is physically active.     Family History  Problem Relation Age of Onset  . Multiple sclerosis Mother   . Multiple sclerosis Brother   . Hypertension Father     Outpatient Encounter Prescriptions as of 10/13/2015  Medication Sig  . amLODipine (NORVASC) 5 MG tablet TAKE 1 TABLET (5 MG TOTAL) BY MOUTH DAILY.  . cyclobenzaprine (FLEXERIL) 10 MG tablet One half tab PO qHS, then increase gradually to one tab TID. (Patient not taking: Reported on 10/13/2015)  . meloxicam (MOBIC) 15 MG tablet One tab PO qAM with breakfast for 2 weeks, then daily  prn pain. (Patient not taking: Reported on 10/13/2015)  . traMADol (ULTRAM) 50 MG tablet 1-2 tabs by mouth Q8 hours, maximum 6 tabs per day. (Patient not taking: Reported on 10/13/2015)   No facility-administered encounter medications on file as of 10/13/2015.          Objective:   Physical Exam  Constitutional: He is oriented to person, place, and time. He appears well-developed and well-nourished.  HENT:  Head: Normocephalic and atraumatic.  Right Ear: External ear normal.  Left Ear: External ear normal.  Nose: Nose normal.  Mouth/Throat: Oropharynx is clear and moist.  Eyes: Conjunctivae and EOM are normal. Pupils are equal, round, and reactive to light.  Neck: Normal range of motion. Neck supple. No thyromegaly present.  Cardiovascular: Normal rate, regular rhythm, normal heart sounds and intact distal pulses.   Pulmonary/Chest: Effort normal and breath sounds normal.  Abdominal: Soft. Bowel sounds are normal. He exhibits no distension and no mass. There is no tenderness. There is no rebound and no guarding.  Musculoskeletal: Normal range of motion.  Lymphadenopathy:    He has no cervical adenopathy.  Neurological: He is alert and oriented to person, place, and time. He has normal reflexes.  Skin:  Skin is warm and dry.  Psychiatric: He has a normal mood and affect. His behavior is normal. Judgment and thought content normal.        Assessment & Plan:  CPE -  Keep up a regular exercise program and make sure you are eating a healthy diet Try to eat 4 servings of dairy a day, or if you are lactose intolerant take a calcium with vitamin D daily.  Your vaccines are up to date.   Hypertension-borderline today. We'll increase amlodipine to 10 mg. Discussed working on diet and exercise. He agrees he probably needs to lose about 10 pounds.  Insomnia-he's also tried multiple things over-the-counter. He mostly has difficulty staying asleep. He says he can get about 4 hours at most  and then wakes up and can't go back to sleep. He says it is not from pain from having to wake up. And some nights he does have a hard time falling asleep. He says he has tried trazodone in the past. get up to urinate. Discussed recommending repeating a trial of trazodone but letting him increase up to 3 mg if he needs to. If she's not getting an effect at that point then we could consider Ambien or Lunesta.

## 2015-10-13 NOTE — Addendum Note (Signed)
Addended by: Nani GasserMETHENEY, CATHERINE D on: 10/13/2015 02:17 PM   Modules accepted: Orders

## 2015-10-18 ENCOUNTER — Ambulatory Visit
Admission: RE | Admit: 2015-10-18 | Discharge: 2015-10-18 | Disposition: A | Discharge status: Home / Self Care | Payer: Managed Care, Other (non HMO) | Source: Ambulatory Visit | Attending: Sports Medicine | Admitting: Sports Medicine

## 2015-10-18 MED ORDER — TRIAMCINOLONE ACETONIDE 40 MG/ML IJ SUSP (RADIOLOGY)
60.0000 mg | Freq: Once | INTRAMUSCULAR | Status: AC
  Administered 2015-10-18: 60 mg via EPIDURAL

## 2015-10-18 MED ORDER — IOHEXOL 300 MG/ML  SOLN
1.0000 mL | Freq: Once | INTRAMUSCULAR | Status: DC | PRN
  Administered 2015-10-18: 1 mL via INTRAVENOUS

## 2015-10-18 NOTE — Discharge Instructions (Signed)

## 2015-10-19 ENCOUNTER — Other Ambulatory Visit: Payer: Managed Care, Other (non HMO)

## 2016-01-03 LAB — LIPID PANEL
Cholesterol: 174 mg/dL (ref 125–200)
HDL: 105 mg/dL (ref >=40)
LDL Cholesterol: 61 mg/dL (ref <130)
TRIGLYCERIDES: 40 mg/dL (ref <150)
Total CHOL/HDL Ratio: 1.7 Ratio (ref <=5.0)
VLDL: 8 mg/dL (ref <30)

## 2016-01-03 LAB — COMPLETE METABOLIC PANEL WITH GFR
ALT: 14 U/L (ref 9–46)
AST: 18 U/L (ref 10–40)
Albumin: 4.1 g/dL (ref 3.6–5.1)
Alkaline Phosphatase: 61 U/L (ref 40–115)
BILIRUBIN TOTAL: 0.7 mg/dL (ref 0.2–1.2)
BUN: 13 mg/dL (ref 7–25)
CHLORIDE: 103 mmol/L (ref 98–110)
CO2: 25 mmol/L (ref 20–31)
CREATININE: 0.99 mg/dL (ref 0.60–1.35)
Calcium: 8.9 mg/dL (ref 8.6–10.3)
GFR, Est Non African American: >89 mL/min (ref >=60)
GLUCOSE: 82 mg/dL (ref 65–99)
Potassium: 4.2 mmol/L (ref 3.5–5.3)
Sodium: 138 mmol/L (ref 135–146)
TOTAL PROTEIN: 7 g/dL (ref 6.1–8.1)

## 2016-01-12 ENCOUNTER — Encounter: Payer: Self-pay | Admitting: Family Medicine

## 2016-01-12 MED ORDER — SUVOREXANT 15 MG PO TABS: 15.0000 mg | ORAL_TABLET | Freq: Every day | ORAL | Status: DC

## 2016-01-12 MED ORDER — SUVOREXANT 10 MG PO TABS: 10.0000 mg | ORAL_TABLET | Freq: Every day | ORAL | Status: DC

## 2016-01-12 MED ORDER — SUVOREXANT 20 MG PO TABS: 20.0000 mg | ORAL_TABLET | Freq: Every day | ORAL | Status: DC

## 2016-04-05 ENCOUNTER — Other Ambulatory Visit: Payer: Self-pay | Admitting: Family Medicine

## 2016-04-10 ENCOUNTER — Telehealth: Payer: Self-pay | Admitting: Family Medicine

## 2016-04-10 NOTE — Telephone Encounter (Signed)
I called and left a message with patient for him to call and schedule a nurse visit BP check

## 2016-05-11 ENCOUNTER — Ambulatory Visit: Payer: Managed Care, Other (non HMO) | Admitting: Family Medicine

## 2016-05-13 ENCOUNTER — Other Ambulatory Visit: Payer: Self-pay | Admitting: Family Medicine

## 2016-06-07 ENCOUNTER — Other Ambulatory Visit: Payer: Self-pay | Admitting: *Deleted

## 2016-06-07 MED ORDER — AMLODIPINE BESYLATE 10 MG PO TABS: 10.0000 mg | ORAL_TABLET | Freq: Every day | ORAL | Status: DC

## 2016-06-13 ENCOUNTER — Other Ambulatory Visit: Payer: Self-pay | Admitting: Family Medicine

## 2016-06-14 ENCOUNTER — Encounter: Payer: Self-pay | Admitting: Family Medicine

## 2016-06-14 ENCOUNTER — Ambulatory Visit (INDEPENDENT_AMBULATORY_CARE_PROVIDER_SITE_OTHER): Payer: Managed Care, Other (non HMO) | Admitting: Family Medicine

## 2016-06-14 VITALS — BP 120/69 | HR 83 | Wt 167.0 lb

## 2016-06-14 DIAGNOSIS — I10 Essential (primary) hypertension: Secondary | ICD-10-CM

## 2016-06-14 DIAGNOSIS — F4321 Adjustment disorder with depressed mood: Secondary | ICD-10-CM

## 2016-06-14 DIAGNOSIS — G4700 Insomnia, unspecified: Secondary | ICD-10-CM | POA: Diagnosis not present

## 2016-06-14 MED ORDER — AMLODIPINE BESYLATE 10 MG PO TABS: 10.0000 mg | ORAL_TABLET | Freq: Every day | ORAL | Status: DC

## 2016-06-14 MED ORDER — ZOLPIDEM TARTRATE 5 MG PO TABS: 5.0000 mg | ORAL_TABLET | Freq: Every evening | ORAL | Status: DC | PRN

## 2016-06-14 NOTE — Progress Notes (Signed)
Subjective:    CC: HTN  HPI: Hypertension- Pt denies chest pain, SOB, dizziness, or heart palpitations.  Taking meds as directed w/o problems.  Denies medication side effects.  He is getting some exercise but not consistently. Next  He still having sleep issues. He did try the Belsomra and says it really didn't work. He'll try trazodone previous to that and says that it caused side effects. His mother passed away 2 months ago so it has really heightened his difficulty with sleep. He says he usually does well with sleep initiation his issue is more sleep maintenance. Typically after about 2 hours of sleep he wakes up and can't go back to sleep.  Past medical history, Surgical history, Family history not pertinant except as noted below, Social history, Allergies, and medications have been entered into the medical record, reviewed, and corrections made.   Review of Systems: No fevers, chills, night sweats, weight loss, chest pain, or shortness of breath.   Objective:    General: Well Developed, well nourished, and in no acute distress.  Neuro: Alert and oriented x3, extra-ocular muscles intact, sensation grossly intact.  HEENT: Normocephalic, atraumatic  Skin: Warm and dry, no rashes. Cardiac: Regular rate and rhythm, no murmurs rubs or gallops, no lower extremity edema.  Respiratory: Clear to auscultation bilaterally. Not using accessory muscles, speaking in full sentences.   Impression and Recommendations:   HTN - Well controlled. Continue current regimen. Follow-up in 6 months.  Insomnia-failed Belsomra and trazodone. We'll try low-dose Ambien. Warned about potential side effects including sedation and sleepwalking etc. Monitor for these and call immediately if any concerns.  Grieving-overall feel like he is doing well with the recent loss of his mother.  He seems at peace with it. He feels liek she is no longer suffering.

## 2016-10-02 ENCOUNTER — Encounter: Payer: Self-pay | Admitting: Family Medicine

## 2016-10-02 DIAGNOSIS — G4700 Insomnia, unspecified: Secondary | ICD-10-CM

## 2016-10-05 NOTE — Telephone Encounter (Signed)
Order placed for behav health.

## 2016-11-22 ENCOUNTER — Encounter (HOSPITAL_COMMUNITY): Payer: Self-pay | Admitting: Psychiatry

## 2016-11-22 ENCOUNTER — Ambulatory Visit (INDEPENDENT_AMBULATORY_CARE_PROVIDER_SITE_OTHER): Payer: Managed Care, Other (non HMO) | Admitting: Psychiatry

## 2016-11-22 VITALS — BP 118/72 | HR 87 | Resp 16 | Ht 67.0 in | Wt 163.0 lb

## 2016-11-22 DIAGNOSIS — F5101 Primary insomnia: Secondary | ICD-10-CM

## 2016-11-22 DIAGNOSIS — F411 Generalized anxiety disorder: Secondary | ICD-10-CM

## 2016-11-22 DIAGNOSIS — Z8249 Family history of ischemic heart disease and other diseases of the circulatory system: Secondary | ICD-10-CM

## 2016-11-22 DIAGNOSIS — Z8489 Family history of other specified conditions: Secondary | ICD-10-CM

## 2016-11-22 DIAGNOSIS — Z79899 Other long term (current) drug therapy: Secondary | ICD-10-CM

## 2016-11-22 MED ORDER — ESCITALOPRAM OXALATE 10 MG PO TABS: 10.0000 mg | ORAL_TABLET | Freq: Every day | ORAL | 0 refills | Status: DC

## 2016-11-22 MED ORDER — ESZOPICLONE 3 MG PO TABS: 3.0000 mg | ORAL_TABLET | Freq: Every day | ORAL | 0 refills | Status: DC

## 2016-11-22 NOTE — Patient Instructions (Signed)
Refer for sleep study

## 2016-11-22 NOTE — Progress Notes (Signed)
Psychiatric Initial Adult Assessment   Patient Identification: Gabriel Juarez MRN:  [id number] Date of Evaluation:  11/22/2016 Referral Source: Dr. Linford Arnold. Primary care Chief Complaint:   Chief Complaint    Follow-up; Establish Care     Visit Diagnosis:    ICD-9-CM ICD-10-CM   1. GAD (generalized anxiety disorder) 300.02 F41.1   2. Primary insomnia 307.42 F51.01     History of Present Illness:  45 currently single African-American male living by himself and also has joint custody of his 45 years old daughter he works full-time  Patient is referred by his primary care for management of anxiety and mostly insomnia. Says that he has had problems going to sleep maintaining sleep for the past 2 years has been tried on medication waiting trazodone did not help insomnia. The Ambien helps him sleep but then he would wake up and then he could not go back to sleep He does endorse having this insomnia related issues for the last 2 years he has had grief issue for the last for 5 months he lost his mom because of multiple sclerosis he says that he does not worry too much about her death as she was sick .  He does endorse worries, excessive at times he worries about his daughter when she is living with her mom because there are men  over there and she doees drink. He feels his worries are excessive at times he has difficulty sleeping and maintaining sleep. He also does endorse he snores and when he wakes up in the middle night he does not know why he wakes up and then he has difficulty going back to sleep He denies hopelessness depression he does feel that he has some down days but overall not depressed days or crying spells  Aggravating factors; he does worry about his daughter when she is with her mom. Grief. Modifying factors; her daughter her work he does go to gym and play golf     Associated Signs/Symptoms: Depression Symptoms:  fatigue, anxiety, (Hypo) Manic Symptoms:   Distractibility, Anxiety Symptoms:  Excessive Worry, Psychotic Symptoms:  no  PTSD Symptoms: NA  Past Psychiatric History: NOne  Previous Psychotropic Medications: Yes  See HPI.  Substance Abuse History in the last 12 months:  No.  Consequences of Substance Abuse: NA  Past Medical History:  Past Medical History:  Diagnosis Date  . Hypertension     Past Surgical History:  Procedure Laterality Date  . NO PAST SURGERIES      Family Psychiatric History: denies   Family History:  Family History  Problem Relation Age of Onset  . Multiple sclerosis Mother   . Multiple sclerosis Brother   . Hypertension Father     Social History:   Social History   Social History  . Marital status: Single    Spouse name: N/A  . Number of children: N/A  . Years of education: N/A   Occupational History  . supervisor Other   Social History Main Topics  . Smoking status: Never Smoker  . Smokeless tobacco: Never Used  . Alcohol use No  . Drug use: No  . Sexual activity: Yes   Other Topics Concern  . None   Social History Narrative   No regular exercise but his job is physically active.     Additional Social History: Grew up with his parents states growing up was okay to parents are split so he was back and forth between mom and dad there is somewhat difficult going  up with no trauma or physical sexual abuse. He was only kid and after 15 years he has another sibling but during that childhood years he was by himself he is not married he has one daughter 385 years of age who has joint custody he works and arts and framework building planes from [employer]    Allergies:  No Known Allergies  Metabolic Disorder Labs: No results found for: HGBA1C, MPG No results found for: PROLACTIN Lab Results  Component Value Date   CHOL 174 01/03/2016   TRIG 40 01/03/2016   HDL 105 01/03/2016   CHOLHDL 1.7 01/03/2016   VLDL 8 01/03/2016   LDLCALC 61 01/03/2016   LDLCALC 78 09/30/2012      Current Medications: Current Outpatient Prescriptions  Medication Sig Dispense Refill  . amLODipine (NORVASC) 10 MG tablet Take 1 tablet (10 mg total) by mouth daily. 90 tablet 1  . escitalopram (LEXAPRO) 10 MG tablet Take 1 tablet (10 mg total) by mouth daily. Take half tablet a day for first 4 days and then one a day 30 tablet 0  . Eszopiclone 3 MG TABS Take 1 tablet (3 mg total) by mouth at bedtime. Take immediately before bedtime 30 tablet 0  . zolpidem (AMBIEN) 5 MG tablet Take 1 tablet (5 mg total) by mouth at bedtime as needed for sleep. (Patient not taking: Reported on 11/22/2016) 30 tablet 1   No current facility-administered medications for this visit.     Neurologic: Headache: No Seizure: No Paresthesias:No  Musculoskeletal: Strength & Muscle Tone: within normal limits Gait & Station: normal Patient leans: no lean  Psychiatric Specialty Exam: Review of Systems  Cardiovascular: Negative for chest pain.  Gastrointestinal: Negative for nausea.  Skin: Negative for rash.  Psychiatric/Behavioral: Negative for depression. The patient has insomnia.     Blood pressure 118/72, pulse 87, resp. rate 16, height 5\' 7"  (1.702 m), weight 163 lb (73.9 kg), SpO2 98 %.Body mass index is 25.53 kg/m.  General Appearance: Casual  Eye Contact:  Good  Speech:  Normal Rate  Volume:  Normal  Mood:  Euthymic  Affect:  Congruent  Thought Process:  Goal Directed  Orientation:  Full (Time, Place, and Person)  Thought Content:  Rumination  Suicidal Thoughts:  No  Homicidal Thoughts:  No  Memory:  Immediate;   Fair Recent;   Fair  Judgement:  Good  Insight:  Fair  Psychomotor Activity:  Normal  Concentration:  Concentration: Fair and Attention Span: Fair  Recall:  Good  Fund of Knowledge:Good  Language: Good  Akathisia:  Negative  Handed:  Right  AIMS (if indicated):    Assets:  Desire for Improvement  ADL's:  Intact  Cognition: WNL  Sleep:  Poor     Treatment Plan  Summary: Medication management and Plan as follows   1. Insomnia: primary; Reviewed sleep hygiene. Possible some restless legs discussed to have enough fruits and lights that evening and also reviewed sleep hygiene in detail will start Lunesta 3 mg at night   also consider sleep study evaluation will refer for sleep evaluation with sleep study. Work on weight loss sleep on the side and keep an extra pillow 2. Generalized anxiety disorder; he's never been on SSRI is possible his worries may be contributing to sleep we'll start Lexapro 5 mg increase to 10 mg and asked 7 days stress-induced side effects  Nicotine use: none Drug use:  None Has sleep study 5 years ago but did not continue treatment.  More than  50% time spent in counseling and coordination of care including patient education and review of side effects and stress factors Follow-up in 3-4 weeks or earlier if needed    Thresa RossAKHTAR, Treesa Mccully, MD 12/7/201711:09 AM

## 2016-12-08 ENCOUNTER — Other Ambulatory Visit: Payer: Self-pay | Admitting: Family Medicine

## 2016-12-11 ENCOUNTER — Other Ambulatory Visit: Payer: Self-pay | Admitting: Family Medicine

## 2016-12-15 ENCOUNTER — Other Ambulatory Visit: Payer: Self-pay | Admitting: Family Medicine

## 2016-12-21 ENCOUNTER — Other Ambulatory Visit: Payer: Self-pay | Admitting: *Deleted

## 2016-12-21 ENCOUNTER — Encounter (HOSPITAL_COMMUNITY): Payer: Self-pay | Admitting: Psychiatry

## 2016-12-21 ENCOUNTER — Ambulatory Visit (INDEPENDENT_AMBULATORY_CARE_PROVIDER_SITE_OTHER): Payer: Managed Care, Other (non HMO) | Admitting: Psychiatry

## 2016-12-21 VITALS — BP 114/66 | HR 97 | Resp 16 | Ht 67.0 in | Wt 163.0 lb

## 2016-12-21 DIAGNOSIS — Z8489 Family history of other specified conditions: Secondary | ICD-10-CM | POA: Diagnosis not present

## 2016-12-21 DIAGNOSIS — F5101 Primary insomnia: Secondary | ICD-10-CM

## 2016-12-21 DIAGNOSIS — F411 Generalized anxiety disorder: Secondary | ICD-10-CM

## 2016-12-21 DIAGNOSIS — Z8249 Family history of ischemic heart disease and other diseases of the circulatory system: Secondary | ICD-10-CM | POA: Diagnosis not present

## 2016-12-21 DIAGNOSIS — Z79899 Other long term (current) drug therapy: Secondary | ICD-10-CM

## 2016-12-21 MED ORDER — AMLODIPINE BESYLATE 10 MG PO TABS: 10.0000 mg | ORAL_TABLET | Freq: Every day | ORAL | 0 refills | Status: DC

## 2016-12-21 MED ORDER — QUETIAPINE FUMARATE 50 MG PO TABS: 50.0000 mg | ORAL_TABLET | Freq: Every day | ORAL | 0 refills | Status: DC

## 2016-12-21 NOTE — Progress Notes (Signed)
[facility] Outpatient Follow up visit  Patient Identification: Gabriel Juarez MRN:  [id number] Date of Evaluation:  12/21/2016 Referral Source: Dr. Linford Arnold. Primary care Chief Complaint:   Chief Complaint    Follow-up     Visit Diagnosis:    ICD-9-CM ICD-10-CM   1. Primary insomnia 307.42 F51.01   2. GAD (generalized anxiety disorder) 300.02 F41.1     History of Present Illness:  46 currently single African-American male living by himself and also has joint custody of his 72 years old daughter he works full-time   Patient suffers from insomnia cannot maintain his sleep last visit we tried [place] but it did not help he does feel somewhat anxious about work hours because of poor sleep and has to remain focus. Does not want to take the Lexapro. Has tried Ambien in the past not sure if he has tried trazodone He feels his mind is racing and some worries at night but not excessive but feels mind racing can affect his sleep Sleep study has not been done yet   Aggravating factors; he does worry about his daughter when she is with her mom. Grief. Modifying factors; her daughter her work he does go to gym and play golf    Past Psychiatric History: NOne    Past Medical History:  Past Medical History:  Diagnosis Date  . Hypertension     Past Surgical History:  Procedure Laterality Date  . NO PAST SURGERIES      Family Psychiatric History: denies   Family History:  Family History  Problem Relation Age of Onset  . Multiple sclerosis Mother   . Multiple sclerosis Brother   . Hypertension Father     Social History:   Social History   Social History  . Marital status: Single    Spouse name: N/A  . Number of children: N/A  . Years of education: N/A   Occupational History  . supervisor Other   Social History Main Topics  . Smoking status: Never Smoker  . Smokeless tobacco: Never Used  . Alcohol use No  . Drug use: No  . Sexual activity: Yes    Partners: Female    Other Topics Concern  . None   Social History Narrative   No regular exercise but his job is physically active.         Allergies:  No Known Allergies  Metabolic Disorder Labs: No results found for: HGBA1C, MPG No results found for: PROLACTIN Lab Results  Component Value Date   CHOL 174 01/03/2016   TRIG 40 01/03/2016   HDL 105 01/03/2016   CHOLHDL 1.7 01/03/2016   VLDL 8 01/03/2016   LDLCALC 61 01/03/2016   LDLCALC 78 09/30/2012     Current Medications: Current Outpatient Prescriptions  Medication Sig Dispense Refill  . escitalopram (LEXAPRO) 10 MG tablet Take 1 tablet (10 mg total) by mouth daily. Take half tablet a day for first 4 days and then one a day 30 tablet 0  . QUEtiapine (SEROQUEL) 50 MG tablet Take 1 tablet (50 mg total) by mouth at bedtime. 30 tablet 0   No current facility-administered medications for this visit.       Psychiatric Specialty Exam: Review of Systems  Cardiovascular: Negative for chest pain and palpitations.  Gastrointestinal: Negative for nausea.  Skin: Negative for rash.  Psychiatric/Behavioral: Negative for depression and substance abuse. The patient has insomnia.     Blood pressure 114/66, pulse 97, resp. rate 16, height 5\' 7"  (1.702 m), weight 163  lb (73.9 kg), SpO2 98 %.Body mass index is 25.53 kg/m.  General Appearance: Casual  Eye Contact:  Good  Speech:  Normal Rate  Volume:  Normal  Mood: euthymic  Affect:  Congruent  Thought Process:  Goal Directed  Orientation:  Full (Time, Place, and Person)  Thought Content:  Rumination  Suicidal Thoughts:  No  Homicidal Thoughts:  No  Memory:  Immediate;   Fair Recent;   Fair  Judgement:  Good  Insight:  Fair  Psychomotor Activity:  Normal  Concentration:  Concentration: Fair and Attention Span: Fair  Recall:  Good  Fund of Knowledge:Good  Language: Good  Akathisia:  Negative  Handed:  Right  AIMS (if indicated):    Assets:  Desire for Improvement  ADL's:  Intact   Cognition: WNL  Sleep:  Poor     Treatment Plan Summary: Medication management and Plan as follows  1. Insomnia; we'll start Seroquel 50 mg to 100 mg discussed and reviewed sleep hygiene if needed we can change this to trazodone or Rozerem. Evaluate his sleep study patient informed to make contact and referrals given 2. GAD: does not want to be on med. Says primary sleep is concern Stop lexapro already not taking  Nicotine use: none Drug use:  None Has sleep study 5 years ago but did not continue treatment.  More than 50% time spent in counseling and coordination of care including patient education and review of side effects and stress factors Follow-up in 3-4 weeks or earlier if needed Time spent: 25 minutes   Gilmore LarocheAKHTAR, Odalys Win, MD 1/5/20189:45 AM

## 2016-12-24 ENCOUNTER — Encounter: Payer: Self-pay | Admitting: Family Medicine

## 2016-12-24 MED ORDER — AMLODIPINE BESYLATE 10 MG PO TABS: 10.0000 mg | ORAL_TABLET | Freq: Every day | ORAL | 0 refills | Status: DC

## 2016-12-31 ENCOUNTER — Encounter: Payer: Self-pay | Admitting: Family Medicine

## 2016-12-31 ENCOUNTER — Ambulatory Visit (INDEPENDENT_AMBULATORY_CARE_PROVIDER_SITE_OTHER): Payer: Managed Care, Other (non HMO) | Admitting: Family Medicine

## 2016-12-31 VITALS — BP 133/85 | HR 86 | Ht 67.0 in | Wt 164.0 lb

## 2016-12-31 DIAGNOSIS — Z0000 Encounter for general adult medical examination without abnormal findings: Secondary | ICD-10-CM | POA: Diagnosis not present

## 2016-12-31 DIAGNOSIS — R7989 Other specified abnormal findings of blood chemistry: Secondary | ICD-10-CM

## 2016-12-31 DIAGNOSIS — R945 Abnormal results of liver function studies: Secondary | ICD-10-CM

## 2016-12-31 NOTE — Patient Instructions (Signed)
Keep up a regular exercise program and make sure you are eating a healthy diet Try to eat 4 servings of dairy a day, or if you are lactose intolerant take a calcium with vitamin D daily.  Your vaccines are up to date.   

## 2016-12-31 NOTE — Progress Notes (Signed)
Subjective:    Patient ID: Gabriel PellegriniKenneth Juarez, male    DOB: 05/11/1971, 46 y.o.   MRN: [id number]  HPI 46 year old male comes in today for complete physical exam. He does have a paper/[employer] form for work that needs to be completed. He has no specific complaints. He is still working on his insomnia with his sleep specialist. Fact they are going to schedule him for a sleep study soon to look for checked of sleep apnea since he does snore and he does not seem to be responding well to medication. He exercises daily.   Review of Systems Hypertensive review of systems is negative.  BP 133/85   Pulse 86   Ht 5\' 7"  (1.702 m)   Wt 164 lb (74.4 kg)   BMI 25.69 kg/m     No Known Allergies  Past Medical History:  Diagnosis Date  . Hypertension     Past Surgical History:  Procedure Laterality Date  . NO PAST SURGERIES      Social History   Social History  . Marital status: Single    Spouse name: N/A  . Number of children: N/A  . Years of education: N/A   Occupational History  . supervisor Other   Social History Main Topics  . Smoking status: Never Smoker  . Smokeless tobacco: Never Used  . Alcohol use No  . Drug use: No  . Sexual activity: Yes    Partners: Female   Other Topics Concern  . Not on file   Social History Narrative   No regular exercise but his job is physically active.     Family History  Problem Relation Age of Onset  . Multiple sclerosis Mother   . Multiple sclerosis Brother   . Hypertension Father     Outpatient Encounter Prescriptions as of 12/31/2016  Medication Sig  . amLODipine (NORVASC) 10 MG tablet Take 1 tablet (10 mg total) by mouth daily. NEED FOLLOW UP APPOINTMENT FOR MORE REFILLS  . QUEtiapine (SEROQUEL) 50 MG tablet Take 1 tablet (50 mg total) by mouth at bedtime.  . [DISCONTINUED] escitalopram (LEXAPRO) 10 MG tablet Take 1 tablet (10 mg total) by mouth daily. Take half tablet a day for first 4 days and then one a day   No  facility-administered encounter medications on file as of 12/31/2016.          Objective:   Physical Exam  Constitutional: He is oriented to person, place, and time. He appears well-developed and well-nourished.  HENT:  Head: Normocephalic and atraumatic.  Right Ear: External ear normal.  Left Ear: External ear normal.  Nose: Nose normal.  Mouth/Throat: Oropharynx is clear and moist.  Eyes: Conjunctivae and EOM are normal. Pupils are equal, round, and reactive to light.  Neck: Normal range of motion. Neck supple. No thyromegaly present.  Cardiovascular: Normal rate, regular rhythm, normal heart sounds and intact distal pulses.   Pulmonary/Chest: Effort normal and breath sounds normal.  Abdominal: Soft. Bowel sounds are normal. He exhibits no distension and no mass. There is no tenderness. There is no rebound and no guarding.  Musculoskeletal: Normal range of motion.  Lymphadenopathy:    He has no cervical adenopathy.  Neurological: He is alert and oriented to person, place, and time. He has normal reflexes.  Skin: Skin is warm and dry.  Psychiatric: He has a normal mood and affect. His behavior is normal. Judgment and thought content normal.       Assessment & Plan:  Keep up a  regular exercise program and make sure you are eating a healthy diet Try to eat 4 servings of dairy a day, or if you are lactose intolerant take a calcium with vitamin D daily.  Your vaccines are up to date.

## 2017-01-01 LAB — COMPLETE METABOLIC PANEL WITH GFR
ALT: 28 U/L (ref 9–46)
AST: 63 U/L — ABNORMAL HIGH (ref 10–40)
Albumin: 4.6 g/dL (ref 3.6–5.1)
Alkaline Phosphatase: 71 U/L (ref 40–115)
BUN: 13 mg/dL (ref 7–25)
CO2: 25 mmol/L (ref 20–31)
Calcium: 9.5 mg/dL (ref 8.6–10.3)
Chloride: 103 mmol/L (ref 98–110)
Creat: 1.04 mg/dL (ref 0.60–1.35)
GFR, EST NON AFRICAN AMERICAN: 86 mL/min (ref >=60)
GFR, Est African American: >89 mL/min (ref >=60)
GLUCOSE: 96 mg/dL (ref 65–99)
POTASSIUM: 4.1 mmol/L (ref 3.5–5.3)
SODIUM: 139 mmol/L (ref 135–146)
TOTAL PROTEIN: 7.5 g/dL (ref 6.1–8.1)
Total Bilirubin: 0.8 mg/dL (ref 0.2–1.2)

## 2017-01-01 LAB — LIPID PANEL
Cholesterol: 177 mg/dL (ref <200)
HDL: 117 mg/dL (ref >40)
LDL Cholesterol: 51 mg/dL (ref <100)
Total CHOL/HDL Ratio: 1.5 Ratio (ref <5.0)
Triglycerides: 44 mg/dL (ref <150)
VLDL: 9 mg/dL (ref <30)

## 2017-01-02 ENCOUNTER — Encounter: Payer: Self-pay | Admitting: Family Medicine

## 2017-01-02 NOTE — Addendum Note (Signed)
Addended by: Deno EtienneBARKLEY, Zara Wendt L on: 01/02/2017 10:14 AM   Modules accepted: Orders

## 2017-01-11 ENCOUNTER — Ambulatory Visit (HOSPITAL_COMMUNITY): Payer: Self-pay | Admitting: Psychiatry

## 2017-01-16 ENCOUNTER — Ambulatory Visit (INDEPENDENT_AMBULATORY_CARE_PROVIDER_SITE_OTHER): Payer: Managed Care, Other (non HMO) | Admitting: Psychiatry

## 2017-01-16 ENCOUNTER — Encounter (HOSPITAL_COMMUNITY): Payer: Self-pay | Admitting: Psychiatry

## 2017-01-16 VITALS — BP 116/66 | HR 72 | Resp 16 | Ht 67.0 in | Wt 168.0 lb

## 2017-01-16 DIAGNOSIS — F5101 Primary insomnia: Secondary | ICD-10-CM | POA: Diagnosis not present

## 2017-01-16 DIAGNOSIS — Z8249 Family history of ischemic heart disease and other diseases of the circulatory system: Secondary | ICD-10-CM | POA: Diagnosis not present

## 2017-01-16 DIAGNOSIS — F411 Generalized anxiety disorder: Secondary | ICD-10-CM

## 2017-01-16 DIAGNOSIS — Z79899 Other long term (current) drug therapy: Secondary | ICD-10-CM

## 2017-01-16 DIAGNOSIS — Z8489 Family history of other specified conditions: Secondary | ICD-10-CM | POA: Diagnosis not present

## 2017-01-16 MED ORDER — GABAPENTIN 300 MG PO CAPS: 300.0000 mg | ORAL_CAPSULE | Freq: Every day | ORAL | 0 refills | Status: DC

## 2017-01-16 MED ORDER — FLUOXETINE HCL 20 MG PO TABS: 20.0000 mg | ORAL_TABLET | Freq: Every day | ORAL | 0 refills | Status: DC

## 2017-01-16 NOTE — Progress Notes (Signed)
[facility]  Patient Identification: Gabriel PellegriniKenneth Juarez MRN:  [id number] Date of Evaluation:  01/16/2017 Referral Source: Dr. Linford ArnoldMetheney. Primary care Chief Complaint:   Chief Complaint    Follow-up     Visit Diagnosis:    ICD-9-CM ICD-10-CM   1. Primary insomnia 307.42 F51.01   2. GAD (generalized anxiety disorder) 300.02 F41.1     History of Present Illness:  [age] currently single African-American male living by himself and also has joint custody of his 46 years old daughter he works full-time   Started on seroquel last visit, didn't do much related with sleep. Felt foggy when cannot sleep Works at [employer] . Says continues to function but feels tired Anxiety : mind wonders and worries. Difficult to slow down toughts at night as well  Has tried Ambien, lexapro, lunesta in past  Sleep study has not been done yet   Aggravating factors; he does worry about his daughter when she is with her mom. Grief. Modifying factors; her daughter her work he does go to gym and play golf    Past Psychiatric History: NOne    Past Medical History:  Past Medical History:  Diagnosis Date  . Hypertension     Past Surgical History:  Procedure Laterality Date  . NO PAST SURGERIES      Family Psychiatric History: denies   Family History:  Family History  Problem Relation Age of Onset  . Multiple sclerosis Mother   . Multiple sclerosis Brother   . Hypertension Father     Social History:   Social History   Social History  . Marital status: Single    Spouse name: N/A  . Number of children: N/A  . Years of education: N/A   Occupational History  . supervisor Other   Social History Main Topics  . Smoking status: Never Smoker  . Smokeless tobacco: Never Used  . Alcohol use No  . Drug use: No  . Sexual activity: Yes    Partners: Female   Other Topics Concern  . None   Social History Narrative   No regular exercise but his job is physically  active.         Allergies:  No Known Allergies  Metabolic Disorder Labs: No results found for: HGBA1C, MPG No results found for: PROLACTIN Lab Results  Component Value Date   CHOL 177 12/31/2016   TRIG 44 12/31/2016   HDL 117 12/31/2016   CHOLHDL 1.5 12/31/2016   VLDL 9 12/31/2016   LDLCALC 51 12/31/2016   LDLCALC 61 01/03/2016     Current Medications: Current Outpatient Prescriptions  Medication Sig Dispense Refill  . amLODipine (NORVASC) 10 MG tablet Take 1 tablet (10 mg total) by mouth daily. NEED FOLLOW UP APPOINTMENT FOR MORE REFILLS 90 tablet 0  . FLUoxetine (PROZAC) 20 MG tablet Take 1 tablet (20 mg total) by mouth daily. 30 tablet 0  . gabapentin (NEURONTIN) 300 MG capsule Take 1 capsule (300 mg total) by mouth at bedtime. Can increase to 2 at night after 3 nights. 45 capsule 0   No current facility-administered medications for this visit.       Psychiatric Specialty Exam: Review of Systems  Cardiovascular: Negative for chest pain.  Skin: Negative for itching.  Psychiatric/Behavioral: Negative for depression and substance abuse. The patient has insomnia.     Blood pressure 116/66, pulse 72, resp. rate 16, height 5\' 7"  (1.702 m), weight 168 lb (76.2 kg), SpO2 96 %.Body mass index is 26.31 kg/m.  General Appearance: Casual  Eye Contact:  Good  Speech:  Normal Rate  Volume:  Normal  Mood: euthymic  Affect:  Congruent  Thought Process:  Goal Directed  Orientation:  Full (Time, Place, and Person)  Thought Content:  Rumination  Suicidal Thoughts:  No  Homicidal Thoughts:  No  Memory:  Immediate;   Fair Recent;   Fair  Judgement:  Good  Insight:  Fair  Psychomotor Activity:  Normal  Concentration:  Concentration: Fair and Attention Span: Fair  Recall:  Good  Fund of Knowledge:Good  Language: Good  Akathisia:  Negative  Handed:  Right  AIMS (if indicated):    Assets:  Desire for Improvement  ADL's:  Intact  Cognition: WNL  Sleep:  Poor      Treatment Plan Summary: Medication management and Plan as follows  1. Insomnia: not improved. DC seroquel. Will start neurontin increase to 600mg  in 3 days. Reviewed sleep hygiene. Refer for sleep study 2. GAD: start prozac 20mg  during the day. Worries may be exacerbating insomnia at night.    Nicotine use: none Drug use:  None Has sleep study 5 years ago but did not continue treatment.  More than 50% time spent in counseling and coordination of care including patient education and review of side effects and stress factors Follow-up in 3-4 weeks or earlier if needed Time spent: 25 minutes   Thresa Ross, MD 1/31/20181:22 PM

## 2017-01-24 ENCOUNTER — Encounter: Payer: Self-pay | Admitting: Pulmonary Disease

## 2017-01-24 ENCOUNTER — Ambulatory Visit (INDEPENDENT_AMBULATORY_CARE_PROVIDER_SITE_OTHER): Payer: Managed Care, Other (non HMO) | Admitting: Pulmonary Disease

## 2017-01-24 VITALS — BP 110/80 | HR 71 | Ht 67.0 in | Wt 170.0 lb

## 2017-01-24 DIAGNOSIS — F322 Major depressive disorder, single episode, severe without psychotic features: Secondary | ICD-10-CM

## 2017-01-24 DIAGNOSIS — G4710 Hypersomnia, unspecified: Secondary | ICD-10-CM | POA: Diagnosis not present

## 2017-01-24 DIAGNOSIS — F331 Major depressive disorder, recurrent, moderate: Secondary | ICD-10-CM | POA: Insufficient documentation

## 2017-01-24 DIAGNOSIS — F32A Depression, unspecified: Secondary | ICD-10-CM | POA: Insufficient documentation

## 2017-01-24 DIAGNOSIS — F339 Major depressive disorder, recurrent, unspecified: Secondary | ICD-10-CM | POA: Insufficient documentation

## 2017-01-24 DIAGNOSIS — F329 Major depressive disorder, single episode, unspecified: Secondary | ICD-10-CM | POA: Insufficient documentation

## 2017-01-24 NOTE — Assessment & Plan Note (Signed)
Pt is being seen for depression but he says he "just has a lot of stress with not being with his 46yo daughter". His wife has custody of their daughter. He has been on Prozac and Gabapentin for 1 week.   His  complaint is mostly of insomnia and frequent awakenings at night, like clockwork. He had that issue even before his daughter was separated from him. He's had it at least 5-6 years now.  He goes to bed from 8-10 pm. It takes him around 30 minutes to fall sleep. He usually wakes up after couple hours, ends up waking up every hour on the hour. Not sure what the reason is. He ends up getting up at 4 in the morning. He works from 6 AM until [employer]. Sometimes in the afternoon, he gets really sleepy and tired.  He has snoring, occasional gasping choking. No witnessed apneas. Has hypersomnia, usually worse in the afternoon. Hypersomnia affects his functionality.  ESS 9  He denies abnormal behavior and sleep.  Plan:   We discussed about the diagnosis of Obstructive Sleep Apnea (OSA) and implications of untreated OSA. We discussed about CPAP and BiPaP as possible treatment options.    I think his frequent awakenings and insomnia are related to awakenings related to apneas. He has had these even before he had issues with their daughter's custody. He has hypersomnia, worse in the afternoon. ESS is 9.  We will schedule the patient for a sleep study. Plan for a home sleep test. If negative, we will go ahead and do a sleep study. Anticipate, no issues with CPAP. Likely with mild-moderate OSA. Anticipate no issues with auto CPAP 5-15 centimeters water. If the lab study and the home study are (-), then we have to investigate further if this is related to depression/anxiety.    Patient was instructed to call the office if he/she has not heard back from the office 1-2 weeks after the sleep study.   Patient was instructed to call the office if he/she is having issues with the PAP device.   We discussed  good sleep hygiene.   Patient was advised not to engage in activities requiring concentration and/or vigilance if he/she is sleepy.  Patient was advised not to drive if he/she is sleepy.

## 2017-01-24 NOTE — Assessment & Plan Note (Signed)
He sees Dr. Gilmore LarocheAkhtar.  Has stress of not being with 46yo daughter who is with her mother.  On prozac and gabapentin.

## 2017-01-24 NOTE — Progress Notes (Signed)
Subjective:    Patient ID: Lothar Prehn, male    DOB: 04/23/1971, 46 y.o.   MRN: [id number]  HPI   This is the case of Sherry Rogus, 46 y.o. Male, who was referred by Dr. Thresa Ross in consultation regarding insomnia.    As you very well know, patient is a non smoker, not known to have asthma or copd. Patient works for [employer], was sent to you for concern of depression but he says he "just has a lot of stress with not being with his 5yo daughter". His wife has custody of their daughter. He has been on Prozac and Gabapentin for 1 week.   His  complaint is mostly of insomnia and frequent awakenings at night, like clockwork. He had that issue even before his daughter was separated from him. He's had it at least 5-6 years now.  He goes to bed from 8-10 pm. It takes him around 30 minutes to fall sleep. He usually wakes up after couple hours, ends up waking up every hour on the hour. Not sure what the reason is. He ends up getting up at 4 in the morning. He works from 6 AM until [employer]. Sometimes in the afternoon, he gets really sleepy and tired.  He has snoring, occasional gasping choking. No witnessed apneas. Has hypersomnia, usually worse in the afternoon. Hypersomnia affects his functionality.  ESS 9  He denies abnormal behavior and sleep.      Review of Systems  Constitutional: Negative for fever and unexpected weight change.  HENT: Negative for congestion, dental problem, ear pain, nosebleeds, postnasal drip, rhinorrhea, sinus pressure, sneezing, sore throat and trouble swallowing.   Eyes: Negative for redness and itching.  Respiratory: Negative for cough, chest tightness, shortness of breath and wheezing.   Cardiovascular: Negative for palpitations and leg swelling.  Gastrointestinal: Negative for nausea and vomiting.  Genitourinary: Negative for dysuria.  Musculoskeletal: Negative for joint swelling.  Skin: Negative for rash.  Neurological: Negative for  headaches.  Hematological: Does not bruise/bleed easily.  Psychiatric/Behavioral: Negative for dysphoric mood. The patient is not nervous/anxious.    Past Medical History:  Diagnosis Date  . Hypertension     Depression, Anxiety (-) CA, DVT  Family History  Problem Relation Age of Onset  . Multiple sclerosis Mother   . Multiple sclerosis Brother   . Hypertension Father      Past Surgical History:  Procedure Laterality Date  . NO PAST SURGERIES      Social History   Social History  . Marital status: Single    Spouse name: N/A  . Number of children: N/A  . Years of education: N/A   Occupational History  . supervisor Other   Social History Main Topics  . Smoking status: Never Smoker  . Smokeless tobacco: Never Used  . Alcohol use No  . Drug use: No  . Sexual activity: Yes    Partners: Female   Other Topics Concern  . Not on file   Social History Narrative   No regular exercise but his job is physically active.    Lives in [place], (-) ETOH. Works at [employer].   No Known Allergies   Outpatient Medications Prior to Visit  Medication Sig Dispense Refill  . amLODipine (NORVASC) 10 MG tablet Take 1 tablet (10 mg total) by mouth daily. NEED FOLLOW UP APPOINTMENT FOR MORE REFILLS 90 tablet 0  . FLUoxetine (PROZAC) 20 MG tablet Take 1 tablet (20 mg total) by mouth daily. 30 tablet 0  .  gabapentin (NEURONTIN) 300 MG capsule Take 1 capsule (300 mg total) by mouth at bedtime. Can increase to 2 at night after 3 nights. 45 capsule 0   No facility-administered medications prior to visit.    No orders of the defined types were placed in this encounter.       Objective:   Physical Exam  Vitals:  Vitals:   01/24/17 1358  BP: 110/80  Pulse: 71  SpO2: 98%  Weight: 170 lb (77.1 kg)  Height: 5\' 7"  (1.702 m)    Constitutional/General:  Pleasant, well-nourished, well-developed, not in any distress,  Comfortably seating.  Well kempt  Body mass index is 26.63  kg/m. Wt Readings from Last 3 Encounters:  01/24/17 170 lb (77.1 kg)  01/16/17 168 lb (76.2 kg)  12/31/16 164 lb (74.4 kg)    Neck circumference:   HEENT: Pupils equal and reactive to light and accommodation. Anicteric sclerae. Normal nasal mucosa.   No oral  lesions,  mouth clear,  oropharynx clear, no postnasal drip. (-) Oral thrush. No dental caries.  Airway - Mallampati class III  Neck: No masses. Midline trachea. No JVD, (-) LAD. (-) bruits appreciated.  Respiratory/Chest: Grossly normal chest. (-) deformity. (-) Accessory muscle use.  Symmetric expansion. (-) Tenderness on palpation.  Resonant on percussion.  Diminished BS on both lower lung zones. (-) wheezing, crackles, rhonchi (-) egophony  Cardiovascular: Regular rate and  rhythm, heart sounds normal, no murmur or gallops, no peripheral edema  Gastrointestinal:  Normal bowel sounds. Soft, non-tender. No hepatosplenomegaly.  (-) masses.   Musculoskeletal:  Normal muscle tone. Normal gait.   Extremities: Grossly normal. (-) clubbing, cyanosis.  (-) edema  Skin: (-) rash,lesions seen.   Neurological/Psychiatric : alert, oriented to time, place, person. Normal mood and affect          Assessment & Plan:  Hypersomnia Pt is being seen for depression but he says he "just has a lot of stress with not being with his 5yo daughter". His wife has custody of their daughter. He has been on Prozac and Gabapentin for 1 week.   His  complaint is mostly of insomnia and frequent awakenings at night, like clockwork. He had that issue even before his daughter was separated from him. He's had it at least 5-6 years now.  He goes to bed from 8-10 pm. It takes him around 30 minutes to fall sleep. He usually wakes up after couple hours, ends up waking up every hour on the hour. Not sure what the reason is. He ends up getting up at 4 in the morning. He works from 6 AM until [employer]. Sometimes in the afternoon, he gets really  sleepy and tired.  He has snoring, occasional gasping choking. No witnessed apneas. Has hypersomnia, usually worse in the afternoon. Hypersomnia affects his functionality.  ESS 9  He denies abnormal behavior and sleep.  Plan:   We discussed about the diagnosis of Obstructive Sleep Apnea (OSA) and implications of untreated OSA. We discussed about CPAP and BiPaP as possible treatment options.    I think his frequent awakenings and insomnia are related to awakenings related to apneas. He has had these even before he had issues with their daughter's custody. He has hypersomnia, worse in the afternoon. ESS is 9.  We will schedule the patient for a sleep study. Plan for a home sleep test. If negative, we will go ahead and do a sleep study. Anticipate, no issues with CPAP. Likely with mild-moderate OSA. Anticipate no  issues with auto CPAP 5-15 centimeters water. If the lab study and the home study are (-), then we have to investigate further if this is related to depression/anxiety.    Patient was instructed to call the office if he/she has not heard back from the office 1-2 weeks after the sleep study.   Patient was instructed to call the office if he/she is having issues with the PAP device.   We discussed good sleep hygiene.   Patient was advised not to engage in activities requiring concentration and/or vigilance if he/she is sleepy.  Patient was advised not to drive if he/she is sleepy.    Depression He sees Dr. Gilmore LarocheAkhtar.  Has stress of not being with 5yo daughter who is with her mother.  On prozac and gabapentin.     Thank you very much for letting me participate in this patient's care. Please do not hesitate to give me a call if you have any questions or concerns regarding the treatment plan.   Patient will follow up with me in 10-12 weeks.     Pollie MeyerJ. Angelo A. de Dios, MD 01/24/2017   2:40 PM Pulmonary and Critical Care Medicine [facility] Pager: [phone] Office: [phone]  [phone], Fax: [phone]

## 2017-01-24 NOTE — Patient Instructions (Signed)

## 2017-01-28 ENCOUNTER — Telehealth: Payer: Self-pay | Admitting: Family Medicine

## 2017-01-28 NOTE — Telephone Encounter (Signed)
I CALLED AND LEFT VM FOR PT TO CALL AND SCHEDULE A bp F/U APPT WITH DR.METHENEY

## 2017-02-11 ENCOUNTER — Ambulatory Visit (INDEPENDENT_AMBULATORY_CARE_PROVIDER_SITE_OTHER): Payer: Managed Care, Other (non HMO) | Admitting: Psychiatry

## 2017-02-11 ENCOUNTER — Encounter (HOSPITAL_COMMUNITY): Payer: Self-pay | Admitting: Psychiatry

## 2017-02-11 VITALS — BP 126/70 | HR 82 | Resp 16 | Ht 67.0 in | Wt 166.4 lb

## 2017-02-11 DIAGNOSIS — F5101 Primary insomnia: Secondary | ICD-10-CM

## 2017-02-11 DIAGNOSIS — Z79899 Other long term (current) drug therapy: Secondary | ICD-10-CM | POA: Diagnosis not present

## 2017-02-11 DIAGNOSIS — F411 Generalized anxiety disorder: Secondary | ICD-10-CM | POA: Diagnosis not present

## 2017-02-11 MED ORDER — GABAPENTIN 300 MG PO CAPS: 600.0000 mg | ORAL_CAPSULE | Freq: Every day | ORAL | 1 refills | Status: DC

## 2017-02-11 MED ORDER — FLUOXETINE HCL 20 MG PO TABS: 20.0000 mg | ORAL_TABLET | Freq: Every day | ORAL | 1 refills | Status: DC

## 2017-02-11 NOTE — Progress Notes (Signed)
[facility] Outpatient Follow up visit  Patient Identification: Gabriel PellegriniKenneth Juarez MRN:  [id number] Date of Evaluation:  02/11/2017 Referral Source: Dr. Linford ArnoldMetheney. Primary care Chief Complaint:   Chief Complaint    Follow-up     Visit Diagnosis:    ICD-9-CM ICD-10-CM   1. GAD (generalized anxiety disorder) 300.02 F41.1   2. Primary insomnia 307.42 F51.01     History of Present Illness:  46 currently single African-American male living by himself and also has joint custody of his 46 years old daughter he works full-time  Patient returns for follow-up last visit we started him on gabapentin and increase it to 600 mg for sleep and racing thoughts that has helped him finally. He has been on multiple other medications for insomnia but it didn't work he started Prozac for generalized anxiety to control the thought processing during the day that is also helped.  He does still goes to bed early so we talked about going to bed a little bit late.  Works at [employer] . Anxiety :improved Has tried Ambien, lexapro, lunesta in past  Sleep study has not been done yet   Aggravating factors; he does worry about his daughter when she is with her mom. Grief. Modifying factors; her daughter her work he does go to gym and play golf    Past Psychiatric History: NOne    Past Medical History:  Past Medical History:  Diagnosis Date  . Hypertension     Past Surgical History:  Procedure Laterality Date  . NO PAST SURGERIES      Family Psychiatric History: denies   Family History:  Family History  Problem Relation Age of Onset  . Multiple sclerosis Mother   . Multiple sclerosis Brother   . Hypertension Father     Social History:   Social History   Social History  . Marital status: Single    Spouse name: N/A  . Number of children: N/A  . Years of education: N/A   Occupational History  . supervisor Other   Social History Main Topics  . Smoking status: Never Smoker  .  Smokeless tobacco: Never Used  . Alcohol use No  . Drug use: No  . Sexual activity: Yes    Partners: Female   Other Topics Concern  . None   Social History Narrative   No regular exercise but his job is physically active.         Allergies:  No Known Allergies  Metabolic Disorder Labs: No results found for: HGBA1C, MPG No results found for: PROLACTIN Lab Results  Component Value Date   CHOL 177 12/31/2016   TRIG 44 12/31/2016   HDL 117 12/31/2016   CHOLHDL 1.5 12/31/2016   VLDL 9 12/31/2016   LDLCALC 51 12/31/2016   LDLCALC 61 01/03/2016     Current Medications: Current Outpatient Prescriptions  Medication Sig Dispense Refill  . amLODipine (NORVASC) 10 MG tablet Take 1 tablet (10 mg total) by mouth daily. NEED FOLLOW UP APPOINTMENT FOR MORE REFILLS 90 tablet 0  . FLUoxetine (PROZAC) 20 MG tablet Take 1 tablet (20 mg total) by mouth daily. 30 tablet 1  . gabapentin (NEURONTIN) 300 MG capsule Take 2 capsules (600 mg total) by mouth at bedtime. 60 capsule 1   No current facility-administered medications for this visit.       Psychiatric Specialty Exam: Review of Systems  Cardiovascular: Negative for palpitations.  Gastrointestinal: Negative for nausea.  Skin: Negative for itching.  Psychiatric/Behavioral: Negative for depression and substance  abuse. The patient has insomnia.     Blood pressure 126/70, pulse 82, resp. rate 16, height 5\' 7"  (1.702 m), weight 166 lb 6.4 oz (75.5 kg), SpO2 95 %.Body mass index is 26.06 kg/m.  General Appearance: Casual  Eye Contact:  Good  Speech:  Normal Rate  Volume:  Normal  Mood: fair  Affect:  Congruent  Thought Process:  Goal Directed  Orientation:  Full (Time, Place, and Person)  Thought Content:  Rumination  Suicidal Thoughts:  No  Homicidal Thoughts:  No  Memory:  Immediate;   Fair Recent;   Fair  Judgement:  Good  Insight:  Fair  Psychomotor Activity:  Normal  Concentration:  Concentration: Fair and Attention  Span: Fair  Recall:  Good  Fund of Knowledge:Good  Language: Good  Akathisia:  Negative  Handed:  Right  AIMS (if indicated):    Assets:  Desire for Improvement  ADL's:  Intact  Cognition: WNL  Sleep:  Poor     Treatment Plan Summary: Medication management and Plan as follows  1. Insomnia: improved. Continue gabapentin 600mg  qhs 2. GAD: improved. Continue prozac 20mg  qd No side effects. Prescriptions sent.   Nicotine use: none Drug use:  None More than 50% of 25 minutes spent in counseling and coordination of treatment inpatient addition review side effects discussed sleep hygiene and patient is going to start to go to bed late brothers and 8 PM. FU 2 months.   Thresa Ross, MD 2/26/20188:17 AM

## 2017-02-13 DIAGNOSIS — G4733 Obstructive sleep apnea (adult) (pediatric): Secondary | ICD-10-CM

## 2017-02-18 ENCOUNTER — Telehealth: Payer: Self-pay | Admitting: Pulmonary Disease

## 2017-02-18 DIAGNOSIS — G4733 Obstructive sleep apnea (adult) (pediatric): Secondary | ICD-10-CM

## 2017-02-18 NOTE — Telephone Encounter (Signed)
[id number] x1 

## 2017-02-18 NOTE — Telephone Encounter (Signed)
Spoke with pt. He is aware of his sleep study results. Order has been placed for CPAP. Pt will call back to make ROV after he is setup with CPAP. Nothing further was needed.

## 2017-02-18 NOTE — Telephone Encounter (Signed)
    Please call the pt and tell the pt the HOME SLEEP STUDY  showed OSA   Pt stops breathing  18  times an hour.   Home sleep study was done on : 02/13/17  Please order autoCPAP 5-15 cm H2O. Patient will need a mask fitting session. Patient will need a 1 month download.   Patient needs to be seen by me or any of the NPs/APPs  4-6 weeks after obtaining the cpap machine. Let me know if you receive this.   Thanks!   J. Alexis FrockAngelo A de Dios, MD 02/18/2017, 2:55 PM

## 2017-02-18 NOTE — Telephone Encounter (Signed)
Patient is returning phone call.  °

## 2017-02-20 ENCOUNTER — Other Ambulatory Visit: Payer: Self-pay | Admitting: *Deleted

## 2017-02-20 DIAGNOSIS — G4710 Hypersomnia, unspecified: Secondary | ICD-10-CM

## 2017-03-22 ENCOUNTER — Ambulatory Visit: Payer: Self-pay | Admitting: Pulmonary Disease

## 2017-03-29 ENCOUNTER — Other Ambulatory Visit (HOSPITAL_COMMUNITY): Payer: Self-pay | Admitting: Psychiatry

## 2017-03-29 ENCOUNTER — Other Ambulatory Visit: Payer: Self-pay | Admitting: Family Medicine

## 2017-04-04 ENCOUNTER — Ambulatory Visit (HOSPITAL_COMMUNITY): Payer: Self-pay | Admitting: Psychiatry

## 2017-04-10 NOTE — Telephone Encounter (Signed)
Received fax from [facility] requesting a 90 day refill for Prozac. Per dr. Gilmore Laroche, refill request is denied. Pt cancel apt on 4/19. Pt will need an apt. Lvm for pt to contact office. Nothing further is needed at this time.

## 2017-04-30 ENCOUNTER — Other Ambulatory Visit (HOSPITAL_COMMUNITY): Payer: Self-pay | Admitting: Psychiatry

## 2017-05-01 NOTE — Telephone Encounter (Signed)
Received fax from [facility] requesting a refill for Gabapentin. Per Dr. Gilmore LarocheAkhtar, refill request is denied. Pt will need to schedule an apt. Lvm for pt to contact office.

## 2017-06-24 ENCOUNTER — Other Ambulatory Visit: Payer: Self-pay | Admitting: Family Medicine

## 2017-07-07 ENCOUNTER — Other Ambulatory Visit: Payer: Self-pay | Admitting: Family Medicine

## 2017-07-15 ENCOUNTER — Encounter: Payer: Self-pay | Admitting: Family Medicine

## 2017-07-17 ENCOUNTER — Encounter: Payer: Self-pay | Admitting: Osteopathic Medicine

## 2017-07-17 ENCOUNTER — Ambulatory Visit (INDEPENDENT_AMBULATORY_CARE_PROVIDER_SITE_OTHER): Payer: 59 | Admitting: Osteopathic Medicine

## 2017-07-17 VITALS — BP 156/82 | HR 69 | Temp 97.7°F | Ht 66.0 in | Wt 165.0 lb

## 2017-07-17 DIAGNOSIS — H6191 Disorder of right external ear, unspecified: Secondary | ICD-10-CM | POA: Diagnosis not present

## 2017-07-17 MED ORDER — BACITRACIN 500 UNIT/GM EX OINT: 1.0000 "application " | TOPICAL_OINTMENT | Freq: Two times a day (BID) | CUTANEOUS | 0 refills | Status: DC

## 2017-07-17 NOTE — Progress Notes (Signed)
HPI: Gabriel PellegriniKenneth Juarez is a 46 y.o. male who [facility] for chief complaint of:  Chief Complaint  Patient presents with  . itchy ear    right    Acute Illness: . Location: R ear lobe . Quality: itching, no pain/pressure, he is concerned about infection. It was a bit more swollen yesterday but seems to be better today . Duration: 2 days    Past medical, social and family history reviewed.  Immune compromising conditions or other risk factors: none  Current medications and allergies reviewed.     Review of Systems:  Constitutional: No  fever/chills  HEENT: No  headache, No  sore throat, No  swollen glands  Skin/Integument:  Yes  rash   Detailed Exam:  BP (!) 156/82   Pulse 69   Temp 97.7 F (36.5 C) (Oral)   Ht 5\' 6"  (1.676 m)   Wt 165 lb (74.8 kg)   BMI 26.63 kg/m   Constitutional:   VSS, see above.   General Appearance: alert, well-developed, well-nourished, NAD  Ears, Nose, Mouth, Throat:   Skin of left ear lobe, mild flaking, nontender, no drainage. There is ear piercing by filament will pass through this without pain. On posterior aspect, there is mild superficial ulceration/excoriation.  Normal tympanic membrane  No lymphadenopathy  Normal mouth/lips/gums, MMM     ASSESSMENT/PLAN:  Disorder of right ear lobe - Plan: bacitracin 500 UNIT/GM ointment   Patient Instructions  I think this is a minor infection of the skin of the earlobe. Your body well likely clear this on its own without antibiotics, but I have sent in a topical treatment to use to help sterilize the area. Use it twice per day for up to one week. Keep the area clean with mild soap and warm water, I would not use alcohol or peroxide to sterilize this as it may end up just irritating the skin. If anything gets worse or changes, please come back and see us so we can reevaluate it    Visit summary was printed for the patient with  medications and pertinent instructions for patient to review. ER/RTC precautions reviewed. All questions answered. Return if symptoms worsen or fail to improve.

## 2017-07-17 NOTE — Patient Instructions (Signed)
I think this is a minor infection of the skin of the earlobe. Your body well likely clear this on its own without antibiotics, but I have sent in a topical treatment to use to help sterilize the area. Use it twice per day for up to one week. Keep the area clean with mild soap and warm water, I would not use alcohol or peroxide to sterilize this as it may end up just irritating the skin. If anything gets worse or changes, please come back and see us so we can reevaluate it

## 2017-07-26 ENCOUNTER — Ambulatory Visit (INDEPENDENT_AMBULATORY_CARE_PROVIDER_SITE_OTHER): Payer: 59 | Admitting: Family Medicine

## 2017-07-26 ENCOUNTER — Encounter: Payer: Self-pay | Admitting: Family Medicine

## 2017-07-26 VITALS — BP 142/80 | HR 72 | Wt 164.0 lb

## 2017-07-26 DIAGNOSIS — I10 Essential (primary) hypertension: Secondary | ICD-10-CM | POA: Diagnosis not present

## 2017-07-26 MED ORDER — AMLODIPINE BESYLATE 10 MG PO TABS: 10.0000 mg | ORAL_TABLET | Freq: Every day | ORAL | 2 refills | Status: DC

## 2017-07-26 NOTE — Progress Notes (Signed)
   Subjective:    Patient ID: Gabriel PellegriniKenneth Feltman, male    DOB: 07/28/1971, 46 y.o.   MRN: [id number]  HPI Hypertension- Pt denies chest pain, SOB, dizziness, or heart palpitations.  Taking meds as directed w/o problems.  Denies medication side effects.  Actually ran out of his medication about 2 weeks ago and this was hoping his blood pressure was look good enough that he could discontinue the medication.   Review of Systems     Objective:   Physical Exam  Constitutional: He is oriented to person, place, and time. He appears well-developed and well-nourished.  HENT:  Head: Normocephalic and atraumatic.  Cardiovascular: Normal rate, regular rhythm and normal heart sounds.   Pulmonary/Chest: Effort normal and breath sounds normal.  Neurological: He is alert and oriented to person, place, and time.  Skin: Skin is warm and dry.  Psychiatric: He has a normal mood and affect. His behavior is normal.        Assessment & Plan:  Hypertension- uncontrolled the office medication for 2 weeks. He is not having any side effects or problems with the medication so we'll go ahead and send over refills and have him follow back up in 6 months.

## 2017-09-01 ENCOUNTER — Encounter: Payer: Self-pay | Admitting: Family Medicine

## 2017-09-10 ENCOUNTER — Encounter: Payer: Self-pay | Admitting: Sports Medicine

## 2017-09-10 ENCOUNTER — Ambulatory Visit (INDEPENDENT_AMBULATORY_CARE_PROVIDER_SITE_OTHER): Payer: 59

## 2017-09-10 ENCOUNTER — Ambulatory Visit (INDEPENDENT_AMBULATORY_CARE_PROVIDER_SITE_OTHER): Payer: 59 | Admitting: Sports Medicine

## 2017-09-10 DIAGNOSIS — M545 Low back pain: Secondary | ICD-10-CM

## 2017-09-10 DIAGNOSIS — Z23 Encounter for immunization: Secondary | ICD-10-CM

## 2017-09-10 DIAGNOSIS — G8929 Other chronic pain: Secondary | ICD-10-CM | POA: Diagnosis not present

## 2017-09-10 DIAGNOSIS — M5412 Radiculopathy, cervical region: Secondary | ICD-10-CM | POA: Diagnosis not present

## 2017-09-10 MED ORDER — MELOXICAM 15 MG PO TABS: ORAL_TABLET | ORAL | 3 refills | Status: DC

## 2017-09-10 MED ORDER — CYCLOBENZAPRINE HCL 10 MG PO TABS
ORAL_TABLET | ORAL | 0 refills | Status: DC
Start: 2017-09-10 — End: 2017-12-02

## 2017-09-10 MED ORDER — PREDNISONE 50 MG PO TABS: ORAL_TABLET | ORAL | 0 refills | Status: DC

## 2017-09-10 NOTE — Progress Notes (Signed)
  Subjective:    CC: follow-up  HPI: Cervical radiculitis: 2 year response to cervical epidural, now with recurrence of symptoms, desires repeat. Moderate, persistent. Overall no change in severity or quality of symptoms.  Low back pain: Moderate, persistent, localized without radiation, nothing radicular, he is unable to describe whether it's discogenic or facetogenic/worse with sitting, standing, walking. Driving.  Past medical history:  Negative.  See flowsheet/record as well for more information.  Surgical history: Negative.  See flowsheet/record as well for more information.  Family history: Negative.  See flowsheet/record as well for more information.  Social history: Negative.  See flowsheet/record as well for more information.  Allergies, and medications have been entered into the medical record, reviewed, and no changes needed.   Review of Systems: No fevers, chills, night sweats, weight loss, chest pain, or shortness of breath.   Objective:    General: Well Developed, well nourished, and in no acute distress.  Neuro: Alert and oriented x3, extra-ocular muscles intact, sensation grossly intact.  HEENT: Normocephalic, atraumatic, pupils equal round reactive to light, neck supple, no masses, no lymphadenopathy, thyroid nonpalpable.  Skin: Warm and dry, no rashes. Cardiac: Regular rate and rhythm, no murmurs rubs or gallops, no lower extremity edema.  Respiratory: Clear to auscultation bilaterally. Not using accessory muscles, speaking in full sentences. Back Exam:  Inspection: Unremarkable  Motion: Flexion 45 deg, Extension 45 deg, Side Bending to 45 deg bilaterally,  Rotation to 45 deg bilaterally  SLR laying: Negative  XSLR laying: Negative  Palpable tenderness: None. FABER: negative. Sensory change: Gross sensation intact to all lumbar and sacral dermatomes.  Reflexes: 2+ at both patellar tendons, 2+ at achilles tendons, Babinski's downgoing.  Strength at foot    Plantar-flexion: 5/5 Dorsi-flexion: 5/5 Eversion: 5/5 Inversion: 5/5  Leg strength  Quad: 5/5 Hamstring: 5/5 Hip flexor: 5/5 Hip abductors: 5/5  Gait unremarkable.  Impression and Recommendations:    Left Cervical radiculopathy Had a 2 year response to the previous cervical epidural. Adding a repeat left-sided cervical epidural. Prednisone, muscle relaxers. Return as needed.  Axial low back pain Recurrence of axial low back pain. Repeating prednisone,Flexeril, formal PT. Return in one month, MRI for interventional planning if no better, previous MRI of the lumbar spine was unrevealing several years ago.  ___________________________________________ Gabriel Juarez. Gabriel Juarez, M.D., ABFM., CAQSM. Primary Care and Sports Medicine [facility]  Adjunct Instructor of Family Medicine  [employer]

## 2017-09-10 NOTE — Assessment & Plan Note (Signed)
Had a 2 year response to the previous cervical epidural. Adding a repeat left-sided cervical epidural. Prednisone, muscle relaxers. Return as needed.

## 2017-09-10 NOTE — Assessment & Plan Note (Signed)
Recurrence of axial low back pain. Repeating prednisone,Flexeril, formal PT. Return in one month, MRI for interventional planning if no better, previous MRI of the lumbar spine was unrevealing several years ago.

## 2017-10-01 ENCOUNTER — Ambulatory Visit
Admission: RE | Admit: 2017-10-01 | Discharge: 2017-10-01 | Disposition: A | Discharge status: Home / Self Care | Payer: 59 | Source: Ambulatory Visit | Attending: Sports Medicine | Admitting: Sports Medicine

## 2017-10-01 MED ORDER — IOPAMIDOL (ISOVUE-M 300) INJECTION 61%
15.0000 mL | Freq: Once | INTRAMUSCULAR | Status: AC | PRN
  Administered 2017-10-01: 15 mL via INTRATHECAL

## 2017-10-01 MED ORDER — TRIAMCINOLONE ACETONIDE 40 MG/ML IJ SUSP (RADIOLOGY)
60.0000 mg | Freq: Once | INTRAMUSCULAR | Status: AC
  Administered 2017-10-01: 60 mg via EPIDURAL

## 2017-10-01 NOTE — Discharge Instructions (Signed)

## 2017-10-08 ENCOUNTER — Encounter: Payer: Self-pay | Admitting: Sports Medicine

## 2017-10-08 ENCOUNTER — Ambulatory Visit (INDEPENDENT_AMBULATORY_CARE_PROVIDER_SITE_OTHER): Payer: 59 | Admitting: Sports Medicine

## 2017-10-08 DIAGNOSIS — M545 Low back pain: Secondary | ICD-10-CM

## 2017-10-08 DIAGNOSIS — G8929 Other chronic pain: Secondary | ICD-10-CM | POA: Diagnosis not present

## 2017-10-08 DIAGNOSIS — M5412 Radiculopathy, cervical region: Secondary | ICD-10-CM

## 2017-10-08 NOTE — Assessment & Plan Note (Signed)
Axial discogenic low back pain resolved after prednisone, Flexeril and home rehabilitation exercises.

## 2017-10-08 NOTE — Assessment & Plan Note (Signed)
Resolved after cervical epidural, previous epidural provided 2 years of relief.

## 2017-10-08 NOTE — Progress Notes (Signed)
  Subjective:    CC: Follow-up  HPI: Cervical degenerative disc disease: Resolved after epidural injection.  Low back pain: Resolved with prednisone and home  Past medical history:  Negative.  See flowsheet/record as well for more information.  Surgical history: Negative.  See flowsheet/record as well for more information.  Family history: Negative.  See flowsheet/record as well for more information.  Social history: Negative.  See flowsheet/record as well for more information.  Allergies, and medications have been entered into the medical record, reviewed, and no changes needed.   Review of Systems: No fevers, chills, night sweats, weight loss, chest pain, or shortness of breath.   Objective:    General: Well Developed, well nourished, and in no acute distress.  Neuro: Alert and oriented x3, extra-ocular muscles intact, sensation grossly intact.  HEENT: Normocephalic, atraumatic, pupils equal round reactive to light, neck supple, no masses, no lymphadenopathy, thyroid nonpalpable.  Skin: Warm and dry, no rashes. Cardiac: Regular rate and rhythm, no murmurs rubs or gallops, no lower extremity edema.  Respiratory: Clear to auscultation bilaterally. Not using accessory muscles, speaking in full sentences.  Impression and Recommendations:    Left Cervical radiculopathy Resolved after cervical epidural, previous epidural provided 2 years of relief.  Axial low back pain Axial discogenic low back pain resolved after prednisone, Flexeril and home rehabilitation exercises.  ___________________________________________ Ihor Austinhomas J. Benjamin Stainhekkekandam, M.D., ABFM., CAQSM. Primary Care and Sports Medicine [facility]  Adjunct Instructor of Family Medicine  [employer]

## 2017-11-17 ENCOUNTER — Encounter: Payer: Self-pay | Admitting: Family Medicine

## 2017-12-02 ENCOUNTER — Encounter: Payer: Self-pay | Admitting: Sports Medicine

## 2017-12-02 ENCOUNTER — Ambulatory Visit (INDEPENDENT_AMBULATORY_CARE_PROVIDER_SITE_OTHER): Payer: 59 | Admitting: Sports Medicine

## 2017-12-02 DIAGNOSIS — M5412 Radiculopathy, cervical region: Secondary | ICD-10-CM

## 2017-12-02 NOTE — Progress Notes (Signed)
  Subjective:    CC: Cervical radiculitis  HPI: This is a very pleasant 46 year old male, I treated him several months ago for left-sided cervical radiculitis, a cervical epidural provided complete relief, he's now having a recurrence of pain. Left-sided, down to the fingers. No progressive weakness, no constitutional symptoms, no headaches, no trauma.  Past medical history:  Negative.  See flowsheet/record as well for more information.  Surgical history: Negative.  See flowsheet/record as well for more information.  Family history: Negative.  See flowsheet/record as well for more information.  Social history: Negative.  See flowsheet/record as well for more information.  Allergies, and medications have been entered into the medical record, reviewed, and no changes needed.   (To billers/coders, pertinent past medical, social, surgical, family history can be found in problem list, if problem list is marked as reviewed then this indicates that past medical, social, surgical, family history was also reviewed)  Review of Systems: No fevers, chills, night sweats, weight loss, chest pain, or shortness of breath.   Objective:    General: Well Developed, well nourished, and in no acute distress.  Neuro: Alert and oriented x3, extra-ocular muscles intact, sensation grossly intact.  HEENT: Normocephalic, atraumatic, pupils equal round reactive to light, neck supple, no masses, no lymphadenopathy, thyroid nonpalpable.  Skin: Warm and dry, no rashes. Cardiac: Regular rate and rhythm, no murmurs rubs or gallops, no lower extremity edema.  Respiratory: Clear to auscultation bilaterally. Not using accessory muscles, speaking in full sentences.  Impression and Recommendations:    Left Cervical radiculopathy Previous epidural was back in September, fantastic relief, now having recurrence of symptoms, repeat left-sided C6-C7 interlaminar epidural. Return in one month after injection to evaluate response,  she does understand that we could proceed with #3 of the series if he still has any discomfort after his next epidural. ___________________________________________ Ihor Austinhomas J. Benjamin Stainhekkekandam, M.D., ABFM., CAQSM. Primary Care and Sports Medicine [facility]  Adjunct Instructor of Family Medicine  [employer]

## 2017-12-02 NOTE — Assessment & Plan Note (Signed)
Previous epidural was back in September, fantastic relief, now having recurrence of symptoms, repeat left-sided C6-C7 interlaminar epidural. Return in one month after injection to evaluate response, she does understand that we could proceed with #3 of the series if he still has any discomfort after his next epidural.

## 2017-12-06 ENCOUNTER — Ambulatory Visit: Payer: Self-pay | Admitting: Sports Medicine

## 2017-12-27 ENCOUNTER — Ambulatory Visit
Admission: RE | Admit: 2017-12-27 | Discharge: 2017-12-27 | Disposition: A | Discharge status: Home / Self Care | Payer: 59 | Source: Ambulatory Visit | Attending: Sports Medicine | Admitting: Sports Medicine

## 2017-12-27 MED ORDER — TRIAMCINOLONE ACETONIDE 40 MG/ML IJ SUSP (RADIOLOGY)
60.0000 mg | Freq: Once | INTRAMUSCULAR | Status: AC
  Administered 2017-12-27: 60 mg via EPIDURAL

## 2017-12-27 MED ORDER — IOPAMIDOL (ISOVUE-M 300) INJECTION 61%
1.0000 mL | Freq: Once | INTRAMUSCULAR | Status: AC | PRN
  Administered 2017-12-27: 1 mL via EPIDURAL

## 2017-12-27 NOTE — Discharge Instructions (Signed)

## 2017-12-30 ENCOUNTER — Ambulatory Visit: Payer: Self-pay | Admitting: Sports Medicine

## 2018-01-16 ENCOUNTER — Encounter: Payer: Self-pay | Admitting: Family Medicine

## 2018-01-17 ENCOUNTER — Encounter: Payer: Self-pay | Admitting: Family Medicine

## 2018-01-23 ENCOUNTER — Encounter: Payer: Self-pay | Admitting: Family Medicine

## 2018-01-23 ENCOUNTER — Ambulatory Visit (INDEPENDENT_AMBULATORY_CARE_PROVIDER_SITE_OTHER): Payer: 59 | Admitting: Family Medicine

## 2018-01-23 VITALS — BP 112/69 | HR 72 | Ht 66.0 in | Wt 164.0 lb

## 2018-01-23 DIAGNOSIS — I10 Essential (primary) hypertension: Secondary | ICD-10-CM

## 2018-01-23 DIAGNOSIS — Z23 Encounter for immunization: Secondary | ICD-10-CM

## 2018-01-23 DIAGNOSIS — Z0000 Encounter for general adult medical examination without abnormal findings: Secondary | ICD-10-CM | POA: Diagnosis not present

## 2018-01-23 LAB — COMPLETE METABOLIC PANEL WITH GFR
AG RATIO: 1.5 (calc) (ref 1.0–2.5)
ALT: 18 U/L (ref 9–46)
AST: 21 U/L (ref 10–40)
Albumin: 4.4 g/dL (ref 3.6–5.1)
Alkaline phosphatase (APISO): 71 U/L (ref 40–115)
BUN: 16 mg/dL (ref 7–25)
CALCIUM: 9.7 mg/dL (ref 8.6–10.3)
CO2: 27 mmol/L (ref 20–32)
CREATININE: 1.07 mg/dL (ref 0.60–1.35)
Chloride: 103 mmol/L (ref 98–110)
GFR, EST AFRICAN AMERICAN: 96 mL/min/{1.73_m2} (ref >=60)
GFR, EST NON AFRICAN AMERICAN: 83 mL/min/{1.73_m2} (ref >=60)
GLOBULIN: 2.9 g/dL (ref 1.9–3.7)
Glucose, Bld: 92 mg/dL (ref 65–99)
Potassium: 4.4 mmol/L (ref 3.5–5.3)
SODIUM: 139 mmol/L (ref 135–146)
TOTAL PROTEIN: 7.3 g/dL (ref 6.1–8.1)
Total Bilirubin: 0.6 mg/dL (ref 0.2–1.2)

## 2018-01-23 LAB — LIPID PANEL W/REFLEX DIRECT LDL
CHOL/HDL RATIO: 2 (calc) (ref <5.0)
Cholesterol: 188 mg/dL (ref <200)
HDL: 95 mg/dL (ref >40)
LDL Cholesterol (Calc): 80 mg/dL (calc)
NON-HDL CHOLESTEROL (CALC): 93 mg/dL (ref <130)
TRIGLYCERIDES: 46 mg/dL (ref <150)

## 2018-01-23 MED ORDER — AMLODIPINE BESYLATE 10 MG PO TABS: 10.0000 mg | ORAL_TABLET | Freq: Every day | ORAL | 2 refills | Status: DC

## 2018-01-23 NOTE — Patient Instructions (Addendum)

## 2018-01-23 NOTE — Progress Notes (Signed)
Subjective:    Patient ID: Felicity PellegriniKenneth Goble, male    DOB: 05/10/1971, 47 y.o.   MRN: [id number]  HPI 47 year old male is here today for complete physical exam.  He has no specific concerns or complaints today.  Overall he is doing well.  He has been working a lot, on average 50-70 hours/week.  So he is not been able to exercise as consistently as he normally does.  He denies any chest pain shortness of breath palpitations or digestive issues.  He will need some refills on his amlodipine today.   Review of Systems  Apprehensive review of systems is negative except for HPI.  BP 112/69   Pulse 72   Ht 5\' 6"  (1.676 m)   Wt 164 lb (74.4 kg)   SpO2 99%   BMI 26.47 kg/m     No Known Allergies  Past Medical History:  Diagnosis Date  . Hypertension     Past Surgical History:  Procedure Laterality Date  . NO PAST SURGERIES      Social History   Socioeconomic History  . Marital status: Single    Spouse name: Not on file  . Number of children: Not on file  . Years of education: Not on file  . Highest education level: Not on file  Social Needs  . Financial resource strain: Not on file  . Food insecurity - worry: Not on file  . Food insecurity - inability: Not on file  . Transportation needs - medical: Not on file  . Transportation needs - non-medical: Not on file  Occupational History  . Occupation: Event organisersupervisor    Employer: OTHER  Tobacco Use  . Smoking status: Never Smoker  . Smokeless tobacco: Never Used  Substance and Sexual Activity  . Alcohol use: No  . Drug use: No  . Sexual activity: Yes    Partners: Female  Other Topics Concern  . Not on file  Social History Narrative   No regular exercise but his job is physically active.     Family History  Problem Relation Age of Onset  . Multiple sclerosis Mother   . Multiple sclerosis Brother   . Hypertension Father     Outpatient Encounter Medications as of 01/23/2018  Medication Sig  . amLODipine (NORVASC) 10 MG  tablet Take 1 tablet (10 mg total) by mouth daily.  . [DISCONTINUED] meloxicam (MOBIC) 15 MG tablet One tab PO qAM with breakfast for 2 weeks, then daily prn pain.  . [DISCONTINUED] QUEtiapine (SEROQUEL) 50 MG tablet Take 1 tablet (50 mg total) by mouth at bedtime.   No facility-administered encounter medications on file as of 01/23/2018.           Objective:   Physical Exam  Constitutional: He is oriented to person, place, and time. He appears well-developed and well-nourished.  HENT:  Head: Normocephalic and atraumatic.  Right Ear: External ear normal.  Left Ear: External ear normal.  Nose: Nose normal.  Mouth/Throat: Oropharynx is clear and moist.  Eyes: Conjunctivae and EOM are normal. Pupils are equal, round, and reactive to light.  Neck: Normal range of motion. Neck supple. No thyromegaly present.  Cardiovascular: Normal rate, regular rhythm, normal heart sounds and intact distal pulses.  Pulmonary/Chest: Effort normal and breath sounds normal.  Abdominal: Soft. Bowel sounds are normal. He exhibits no distension and no mass. There is no tenderness. There is no rebound and no guarding.  Musculoskeletal: Normal range of motion.  Lymphadenopathy:    He has no cervical  adenopathy.  Neurological: He is alert and oriented to person, place, and time. He has normal reflexes.  Skin: Skin is warm and dry.  Psychiatric: He has a normal mood and affect. His behavior is normal. Judgment and thought content normal.       Assessment & Plan:  CPE Keep up a regular exercise program and make sure you are eating a healthy diet Try to eat 4 servings of dairy a day, or if you are lactose intolerant take a calcium with vitamin D daily.  Your vaccines are up to date.  Tdap given today.

## 2018-01-24 ENCOUNTER — Telehealth: Payer: Self-pay | Admitting: *Deleted

## 2018-01-24 NOTE — Telephone Encounter (Signed)
Form completed,copied,scanned,faxed,confirmation received.Gabriel Juarez, Gabriel Juarez, CMA

## 2018-06-26 ENCOUNTER — Encounter: Payer: Self-pay | Admitting: Family Medicine

## 2018-06-27 ENCOUNTER — Encounter: Payer: Self-pay | Admitting: Sports Medicine

## 2018-06-27 ENCOUNTER — Ambulatory Visit (INDEPENDENT_AMBULATORY_CARE_PROVIDER_SITE_OTHER): Payer: 59 | Admitting: Sports Medicine

## 2018-06-27 ENCOUNTER — Ambulatory Visit (INDEPENDENT_AMBULATORY_CARE_PROVIDER_SITE_OTHER): Payer: 59

## 2018-06-27 DIAGNOSIS — S299XXA Unspecified injury of thorax, initial encounter: Secondary | ICD-10-CM | POA: Insufficient documentation

## 2018-06-27 MED ORDER — TRAMADOL HCL 50 MG PO TABS: 50.0000 mg | ORAL_TABLET | Freq: Three times a day (TID) | ORAL | 0 refills | Status: DC | PRN
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
## 2018-06-27 NOTE — Assessment & Plan Note (Signed)
Tenderness over the costal margin, repeating rib x-rays. Rib belt. Tramadol for pain, return in 2 weeks.

## 2018-07-11 ENCOUNTER — Ambulatory Visit: Payer: Self-pay | Admitting: Sports Medicine

## 2018-07-15 ENCOUNTER — Encounter: Payer: Self-pay | Admitting: Sports Medicine
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
## 2018-12-23 ENCOUNTER — Ambulatory Visit (INDEPENDENT_AMBULATORY_CARE_PROVIDER_SITE_OTHER): Payer: 59 | Admitting: Physician Assistant

## 2018-12-23 ENCOUNTER — Encounter: Payer: Self-pay | Admitting: Physician Assistant

## 2018-12-23 VITALS — BP 127/68 | HR 84 | Temp 98.8°F | Ht 66.0 in | Wt 161.8 lb

## 2018-12-23 DIAGNOSIS — A084 Viral intestinal infection, unspecified: Secondary | ICD-10-CM

## 2018-12-23 DIAGNOSIS — R197 Diarrhea, unspecified: Secondary | ICD-10-CM | POA: Diagnosis not present

## 2018-12-23 MED ORDER — DIPHENOXYLATE-ATROPINE 2.5-0.025 MG PO TABS: ORAL_TABLET | ORAL | 0 refills | Status: DC

## 2018-12-23 NOTE — Progress Notes (Deleted)
l °
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
## 2018-12-23 NOTE — Patient Instructions (Signed)

## 2018-12-23 NOTE — Progress Notes (Deleted)
   Subjective:    Patient ID: Gabriel Juarez, male    DOB: 01-Aug-1971, 48 y.o.   MRN: [id number]  HPI  No vomiting.   You and son.   Appetite and scared to ear crackers.   bM 4-5 a   Review of Systems     Objective:   Physical Exam        Assessment & Plan:

## 2018-12-23 NOTE — H&P (Addendum)
Subjective:    Patient ID: Gabriel PellegriniKenneth Juarez, male    DOB: 06/19/1971, 48 y.o.   MRN: [id number]  HPI: Patient is a 48 year old male who presents with complaints of a "stomach bug" since Saturday. Patient states that his son has experienced similar symptoms and is improving. Patient complains of nausea, diarrhea, fever, and abdominal pain. The fever and abdominal pain have subsided since symptom onset. He experiences diarrhea 4-5 times per day usually after he eats something. He denies vomiting, bloody stools, recent travel, recent antibiotic use, or consumption of atypical foods. He states that he is able to consume fluids throughout the day including water and ginger ale.   .. Active Ambulatory Problems    Diagnosis Date Noted  . Essential hypertension, benign 10/01/2012  . Axial low back pain 05/14/2013  . Left Cervical radiculopathy 01/04/2014  . Insomnia 10/13/2015  . Hypersomnia 01/24/2017  . Depression 01/24/2017  . Chest trauma 06/27/2018   Resolved Ambulatory Problems    Diagnosis Date Noted  . Lumbago 11/17/2010  . Muscle weakness (generalized) 11/17/2010  . Left lateral epicondylitis 03/16/2014   Past Medical History:  Diagnosis Date  . Hypertension        Review of Systems  Constitutional: Negative for appetite change, chills and fever.  Gastrointestinal: Positive for diarrhea and nausea. Negative for abdominal pain, blood in stool and vomiting.  Endocrine: Negative for polyuria.  Genitourinary: Negative for dysuria and hematuria.       Objective:   Physical Exam Vitals signs reviewed.  Constitutional:      General: He is not in acute distress.    Appearance: He is not ill-appearing.  HENT:     Right Ear: Tympanic membrane and external ear normal.     Left Ear: Tympanic membrane and external ear normal.     Nose: No congestion or rhinorrhea.     Mouth/Throat:     Mouth: Mucous membranes are moist.  Eyes:     General:        Right eye: No discharge.         Left eye: No discharge.     Pupils: Pupils are equal, round, and reactive to light.  Cardiovascular:     Rate and Rhythm: Normal rate.     Pulses: Normal pulses.  Pulmonary:     Effort: Pulmonary effort is normal. No respiratory distress.     Breath sounds: Normal breath sounds.  Abdominal:     General: Bowel sounds are normal. There is distension.     Tenderness: There is no abdominal tenderness. There is no guarding.  Skin:    General: Skin is warm and dry.     Capillary Refill: Capillary refill takes less than 2 seconds.  Neurological:     Mental Status: He is alert.           Assessment & Plan:  Marland Kitchen.Marland Kitchen.Gabriel Juarez was seen today for gi problem.  Diagnoses and all orders for this visit:  Viral gastroenteritis  Other orders -     diphenoxylate-atropine (LOMOTIL) 2.5-0.025 MG tablet; One to 2 tablets by mouth 4 times a day as needed for diarrhea.   Gastroenteritis: given frequency of diarrhea and confirmed sick contacts with similar symptoms.  -Lomotil: Take 1-2 tabs PO PRN for diarrhea, do not exceed more than 8 tabs QD.  -Oral rehydration: continue consumption of water, gatorade, ginger ale to prevent dehydration.  Parke Simmers-Bland diet: start with bland foods such as crackers, oatmeal, noodles and advance diet as tolerated.  Contact office should symptoms worsen or fail to resolve within 10-14 days or should patient experience fever, dizziness, or bloody stools.   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

## 2018-12-24 ENCOUNTER — Other Ambulatory Visit: Payer: Self-pay | Admitting: *Deleted

## 2018-12-24 DIAGNOSIS — I10 Essential (primary) hypertension: Secondary | ICD-10-CM

## 2018-12-24 MED ORDER — AMLODIPINE BESYLATE 10 MG PO TABS: 10.0000 mg | ORAL_TABLET | Freq: Every day | ORAL | 0 refills | Status: DC

## 2019-01-15 ENCOUNTER — Telehealth: Payer: Self-pay | Admitting: *Deleted

## 2019-01-15 NOTE — Telephone Encounter (Signed)
Calling pt to clarify if his insurance requires a full waiting period of 365 days before he gets a physical or if he has new insurance. Also to tell him that if we discuss additional issues at the time of his physical he will be billed for this due to it not being a part of his yearly exam.   Asked that he return the call to clarify this information prior to his appointment.Laureen Ochs, Viann Shove, CMA

## 2019-01-15 NOTE — Telephone Encounter (Signed)
Baron Sane, CMA

## 2019-01-15 NOTE — Telephone Encounter (Signed)
Alante called back and he stated it is ok to proceed with the physical.

## 2019-01-19 ENCOUNTER — Encounter: Payer: 59 | Admitting: Family Medicine

## 2019-01-19 ENCOUNTER — Encounter: Payer: Self-pay | Admitting: Family Medicine

## 2019-01-19 NOTE — Progress Notes (Deleted)
Acute Office Visit  Subjective:    Patient ID: Gabriel Juarez, male    DOB: [birth year], 48 y.o.   MRN: [id number]  No chief complaint on file.   HPI Patient is in today for pain for ball of the foot.    Past Medical History:  Diagnosis Date  . Hypertension     Past Surgical History:  Procedure Laterality Date  . NO PAST SURGERIES      Family History  Problem Relation Age of Onset  . Multiple sclerosis Mother   . Multiple sclerosis Brother   . Hypertension Father     Social History   Socioeconomic History  . Marital status: Single    Spouse name: Not on file  . Number of children: Not on file  . Years of education: Not on file  . Highest education level: Not on file  Occupational History  . 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 Financial resource strain: Not on file  . Food insecurity:    Worry: Not on file    Inability: Not on file  . Transportation needs:    Medical: Not on file    Non-medical: Not on file  Tobacco Use  . Smoking status: Never Smoker  . Smokeless tobacco: Never Used  Substance and Sexual Activity  . Alcohol use: No  . Drug use: No  . Sexual activity: Yes    Partners: Female  Lifestyle  . Physical activity:    Days per week: Not on file    Minutes per session: Not on file  . Stress: Not on file  Relationships  . Social connections:    Talks on phone: Not on file    Gets together: Not on file    Attends religious service: Not on file    Active member of club or organization: Not on file    Attends meetings of clubs or organizations: Not on file    Relationship status: Not on file  . Intimate partner violence:    Fear of current or ex partner: Not on file    Emotionally abused: Not on file    Physically abused: Not on file    Forced sexual activity: Not on file  Other Topics Concern  . Not on file  Social History Narrative   No regular exercise but his job is physically active.     Outpatient  Medications Prior to Visit  Medication Sig Dispense Refill  . amLODipine (NORVASC) 10 MG tablet Take 1 tablet (10 mg total) by mouth daily. FINAL REFILL. MUST KEEP APPOINTMENT ON 01/19/2019 FOR ADDITIONAL REFILLS 30 tablet 0  . diphenoxylate-atropine (LOMOTIL) 2.5-0.025 MG tablet One to 2 tablets by mouth 4 times a day as needed for diarrhea. 30 tablet 0   No facility-administered medications prior to visit.     No Known Allergies  ROS     Objective:    Physical Exam  There were no vitals taken for this visit. Wt Readings from Last 3 Encounters:  12/23/18 161 lb 12.8 oz (73.4 kg)  06/27/18 168 lb (76.2 kg)  01/23/18 164 lb (74.4 kg)    Health Maintenance Due  Topic Date Due  . HIV Screening  06/19/1986    There are no preventive care reminders to display for this patient.   Lab Results  Component Value Date   TSH 1.556 04/13/2014   Lab Results  Component Value Date   WBC 8.2 04/13/2014   HGB 14.1 04/13/2014  HCT 40.4 04/13/2014   MCV 94.0 04/13/2014   PLT 298 04/13/2014   Lab Results  Component Value Date   NA 139 01/23/2018   K 4.4 01/23/2018   CO2 27 01/23/2018   GLUCOSE 92 01/23/2018   BUN 16 01/23/2018   CREATININE 1.07 01/23/2018   BILITOT 0.6 01/23/2018   ALKPHOS 71 12/31/2016   AST 21 01/23/2018   ALT 18 01/23/2018   PROT 7.3 01/23/2018   ALBUMIN 4.6 12/31/2016   CALCIUM 9.7 01/23/2018   Lab Results  Component Value Date   CHOL 188 01/23/2018   Lab Results  Component Value Date   HDL 95 01/23/2018   Lab Results  Component Value Date   LDLCALC 80 01/23/2018   Lab Results  Component Value Date   TRIG 46 01/23/2018   Lab Results  Component Value Date   CHOLHDL 2.0 01/23/2018   No results found for: HGBA1C     Assessment & Plan:   Problem List Items Addressed This Visit    None    Visit Diagnoses    Routine general medical examination at a health care facility    -  Primary   Screening for HIV without presence of risk factors            No orders of the defined types were placed in this encounter.    Nani Gasseratherine Metheney, MD

## 2019-01-21 ENCOUNTER — Telehealth: Payer: Self-pay | Admitting: Family Medicine

## 2019-01-21 NOTE — Telephone Encounter (Signed)
Okay, I've cancelled the other physical.  However, there are still three physicals scheduled one of  them being the Biometric screening, is  this okay?

## 2019-01-21 NOTE — Telephone Encounter (Signed)
He actually had an appointment on 01/19/2019 for a physical that he NO SHOWED. So his biometric screening would actually be the same as what he was supposed to come in Monday for.   So it looks like he is taking 2 spots on 02/02/2019. I'm confused.Laureen Ochs, Viann Shove, CMA

## 2019-01-21 NOTE — Telephone Encounter (Signed)
Pt scheduled a Biometric screening on 2/17 via mychart but it's in front of a physical, is this okay? Thanks

## 2019-01-31 ENCOUNTER — Other Ambulatory Visit: Payer: Self-pay | Admitting: Family Medicine

## 2019-01-31 DIAGNOSIS — I10 Essential (primary) hypertension: Secondary | ICD-10-CM

## 2019-02-02 ENCOUNTER — Encounter: Payer: Self-pay | Admitting: Family Medicine

## 2019-02-02 ENCOUNTER — Ambulatory Visit (INDEPENDENT_AMBULATORY_CARE_PROVIDER_SITE_OTHER): Payer: 59 | Admitting: Family Medicine

## 2019-02-02 ENCOUNTER — Encounter: Payer: 59 | Admitting: Family Medicine

## 2019-02-02 ENCOUNTER — Other Ambulatory Visit: Payer: Self-pay | Admitting: *Deleted

## 2019-02-02 VITALS — BP 129/72 | HR 67 | Ht 66.0 in | Wt 162.0 lb

## 2019-02-02 DIAGNOSIS — I10 Essential (primary) hypertension: Secondary | ICD-10-CM

## 2019-02-02 DIAGNOSIS — Z114 Encounter for screening for human immunodeficiency virus [HIV]: Secondary | ICD-10-CM

## 2019-02-02 DIAGNOSIS — Z0000 Encounter for general adult medical examination without abnormal findings: Secondary | ICD-10-CM | POA: Diagnosis not present

## 2019-02-02 MED ORDER — AMLODIPINE BESYLATE 10 MG PO TABS
10.0000 mg | ORAL_TABLET | Freq: Every day | ORAL | 1 refills | Status: DC
Start: 2019-02-02 — End: 2019-08-03

## 2019-02-02 NOTE — Patient Instructions (Signed)

## 2019-02-02 NOTE — Progress Notes (Signed)
Acute Office Visit  Subjective:    Patient ID: Gabriel Juarez, male    DOB: [birth year], 48 y.o.   MRN: [id number]  Chief Complaint  Patient presents with  . Annual Exam    HPI Patient is in today for CPE  Past Medical History:  Diagnosis Date  . Hypertension     Past Surgical History:  Procedure Laterality Date  . NO PAST SURGERIES      Family History  Problem Relation Age of Onset  . Multiple sclerosis Mother   . Multiple sclerosis Brother   . Hypertension Father     Social History   Socioeconomic History  . Marital status: Single    Spouse name: Not on file  . Number of children: Not on file  . Years of education: Not on file  . Highest education level: Not on file  Occupational History  . 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 Financial resource strain: Not on file  . Food insecurity:    Worry: Not on file    Inability: Not on file  . Transportation needs:    Medical: Not on file    Non-medical: Not on file  Tobacco Use  . Smoking status: Never Smoker  . Smokeless tobacco: Never Used  Substance and Sexual Activity  . Alcohol use: No  . Drug use: No  . Sexual activity: Yes    Partners: Female  Lifestyle  . Physical activity:    Days per week: Not on file    Minutes per session: Not on file  . Stress: Not on file  Relationships  . Social connections:    Talks on phone: Not on file    Gets together: Not on file    Attends religious service: Not on file    Active member of club or organization: Not on file    Attends meetings of clubs or organizations: Not on file    Relationship status: Not on file  . Intimate partner violence:    Fear of current or ex partner: Not on file    Emotionally abused: Not on file    Physically abused: Not on file    Forced sexual activity: Not on file  Other Topics Concern  . Not on file  Social History Narrative   No regular exercise but his job is physically active.     Outpatient  Medications Prior to Visit  Medication Sig Dispense Refill  . amLODipine (NORVASC) 10 MG tablet Take 1 tablet (10 mg total) by mouth daily. FINAL REFILL. MUST KEEP APPOINTMENT ON 01/19/2019 FOR ADDITIONAL REFILLS 30 tablet 0  . diphenoxylate-atropine (LOMOTIL) 2.5-0.025 MG tablet One to 2 tablets by mouth 4 times a day as needed for diarrhea. 30 tablet 0   No facility-administered medications prior to visit.     No Known Allergies  ROS     Objective:    Physical Exam  Constitutional: He is oriented to person, place, and time. He appears well-developed and well-nourished.  HENT:  Head: Normocephalic and atraumatic.  Right Ear: External ear normal.  Left Ear: External ear normal.  Nose: Nose normal.  Mouth/Throat: Oropharynx is clear and moist.  Eyes: Pupils are equal, round, and reactive to light. Conjunctivae and EOM are normal.  Neck: Normal range of motion. Neck supple. No thyromegaly present.  Cardiovascular: Normal rate, regular rhythm, normal heart sounds and intact distal pulses.  Pulmonary/Chest: Effort normal and breath sounds normal.  Abdominal: Soft. Bowel sounds are  normal. He exhibits no distension and no mass. There is no abdominal tenderness. There is no rebound and no guarding.  Musculoskeletal: Normal range of motion.  Lymphadenopathy:    He has no cervical adenopathy.  Neurological: He is alert and oriented to person, place, and time. He has normal reflexes.  Skin: Skin is warm and dry.  Psychiatric: He has a normal mood and affect. His behavior is normal. Judgment and thought content normal.    BP 129/72   Pulse 67   Ht 5\' 6"  (1.676 m)   Wt 162 lb (73.5 kg)   SpO2 100%   BMI 26.15 kg/m  Wt Readings from Last 3 Encounters:  02/02/19 162 lb (73.5 kg)  12/23/18 161 lb 12.8 oz (73.4 kg)  06/27/18 168 lb (76.2 kg)    Health Maintenance Due  Topic Date Due  . HIV Screening  06/19/1986    There are no preventive care reminders to display for this  patient.   Lab Results  Component Value Date   TSH 1.556 04/13/2014   Lab Results  Component Value Date   WBC 8.2 04/13/2014   HGB 14.1 04/13/2014   HCT 40.4 04/13/2014   MCV 94.0 04/13/2014   PLT 298 04/13/2014   Lab Results  Component Value Date   NA 139 01/23/2018   K 4.4 01/23/2018   CO2 27 01/23/2018   GLUCOSE 92 01/23/2018   BUN 16 01/23/2018   CREATININE 1.07 01/23/2018   BILITOT 0.6 01/23/2018   ALKPHOS 71 12/31/2016   AST 21 01/23/2018   ALT 18 01/23/2018   PROT 7.3 01/23/2018   ALBUMIN 4.6 12/31/2016   CALCIUM 9.7 01/23/2018   Lab Results  Component Value Date   CHOL 188 01/23/2018   Lab Results  Component Value Date   HDL 95 01/23/2018   Lab Results  Component Value Date   LDLCALC 80 01/23/2018   Lab Results  Component Value Date   TRIG 46 01/23/2018   Lab Results  Component Value Date   CHOLHDL 2.0 01/23/2018   No results found for: HGBA1C     Assessment & Plan:   Problem List Items Addressed This Visit    None    Visit Diagnoses    Routine general medical examination at a health care facility    -  Primary   Relevant Orders   Lipid panel   COMPLETE METABOLIC PANEL WITH GFR   HIV antibody (with reflex)   Screening for HIV without presence of risk factors       Relevant Orders   HIV antibody (with reflex)     Keep up a regular exercise program and make sure you are eating a healthy diet Try to eat 4 servings of dairy a day, or if you are lactose intolerant take a calcium with vitamin D daily.  Your vaccines are up to date.     No orders of the defined types were placed in this encounter.    Nani Gasser, MD

## 2019-02-03 LAB — LIPID PANEL
Cholesterol: 183 mg/dL (ref <200)
HDL: 106 mg/dL (ref >=40)
LDL Cholesterol (Calc): 64 mg/dL (calc)
Non-HDL Cholesterol (Calc): 77 mg/dL (calc) (ref <130)
TRIGLYCERIDES: 44 mg/dL (ref <150)
Total CHOL/HDL Ratio: 1.7 (calc) (ref <5.0)

## 2019-02-03 LAB — COMPLETE METABOLIC PANEL WITH GFR
AG RATIO: 1.5 (calc) (ref 1.0–2.5)
ALBUMIN MSPROF: 4.5 g/dL (ref 3.6–5.1)
ALT: 13 U/L (ref 9–46)
AST: 20 U/L (ref 10–40)
Alkaline phosphatase (APISO): 80 U/L (ref 36–130)
BILIRUBIN TOTAL: 0.6 mg/dL (ref 0.2–1.2)
BUN: 14 mg/dL (ref 7–25)
CHLORIDE: 101 mmol/L (ref 98–110)
CO2: 27 mmol/L (ref 20–32)
Calcium: 9.8 mg/dL (ref 8.6–10.3)
Creat: 1.03 mg/dL (ref 0.60–1.35)
GFR, EST AFRICAN AMERICAN: 100 mL/min/{1.73_m2} (ref >=60)
GFR, EST NON AFRICAN AMERICAN: 86 mL/min/{1.73_m2} (ref >=60)
Globulin: 3.1 g/dL (calc) (ref 1.9–3.7)
Glucose, Bld: 93 mg/dL (ref 65–99)
POTASSIUM: 4 mmol/L (ref 3.5–5.3)
Sodium: 137 mmol/L (ref 135–146)
TOTAL PROTEIN: 7.6 g/dL (ref 6.1–8.1)

## 2019-02-03 LAB — HIV ANTIBODY (ROUTINE TESTING W REFLEX): HIV 1&2 Ab, 4th Generation: NONREACTIVE

## 2019-02-04 ENCOUNTER — Telehealth: Payer: Self-pay | Admitting: *Deleted

## 2019-02-04 NOTE — Telephone Encounter (Signed)
Form completed,faxed,confirmation received and scanned into patient's chart.Laureen Ochs, Viann Shove

## 2019-02-11 ENCOUNTER — Encounter: Payer: Self-pay | Admitting: Family Medicine

## 2019-02-11 ENCOUNTER — Ambulatory Visit (INDEPENDENT_AMBULATORY_CARE_PROVIDER_SITE_OTHER): Payer: 59 | Admitting: Family Medicine

## 2019-02-11 VITALS — BP 122/62 | HR 84 | Ht 66.0 in | Wt 161.0 lb

## 2019-02-11 DIAGNOSIS — L84 Corns and callosities: Secondary | ICD-10-CM

## 2019-02-11 NOTE — Progress Notes (Signed)
Acute Office Visit  Subjective:    Patient ID: Gabriel Juarez, male    DOB: November 21, 1971, 48 y.o.   MRN: [id number]  Chief Complaint  Patient presents with  . Callouses    R lateral side of foot @ little toe x 6 mos, pain started 2 wks ago. he has been using inserts and changing his shoes and getting pedicures done with no relief    HPI Patient is in today for callous on the outside of his right foot x 6 months.  Has changed shoes and inserts.  He is getting pedicures. Became painful about 2 weeks ago.  He has not tried any topical treatments.  Past Medical History:  Diagnosis Date  . Hypertension     Past Surgical History:  Procedure Laterality Date  . NO PAST SURGERIES      Family History  Problem Relation Age of Onset  . Multiple sclerosis Mother   . Multiple sclerosis Brother   . Hypertension Father     Social History   Socioeconomic History  . Marital status: Single    Spouse name: Not on file  . Number of children: Not on file  . Years of education: Not on file  . Highest education level: Not on file  Occupational History  . 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 Financial resource strain: Not on file  . Food insecurity:    Worry: Not on file    Inability: Not on file  . Transportation needs:    Medical: Not on file    Non-medical: Not on file  Tobacco Use  . Smoking status: Never Smoker  . Smokeless tobacco: Never Used  Substance and Sexual Activity  . Alcohol use: No  . Drug use: No  . Sexual activity: Yes    Partners: Female  Lifestyle  . Physical activity:    Days per week: Not on file    Minutes per session: Not on file  . Stress: Not on file  Relationships  . Social connections:    Talks on phone: Not on file    Gets together: Not on file    Attends religious service: Not on file    Active member of club or organization: Not on file    Attends meetings of clubs or organizations: Not on file    Relationship  status: Not on file  . Intimate partner violence:    Fear of current or ex partner: Not on file    Emotionally abused: Not on file    Physically abused: Not on file    Forced sexual activity: Not on file  Other Topics Concern  . Not on file  Social History Narrative   No regular exercise but his job is physically active.     Outpatient Medications Prior to Visit  Medication Sig Dispense Refill  . amLODipine (NORVASC) 10 MG tablet Take 1 tablet (10 mg total) by mouth daily. 90 tablet 1  . QUEtiapine (SEROQUEL) 50 MG tablet Take 1 tablet (50 mg total) by mouth at bedtime. 30 tablet 0   No facility-administered medications prior to visit.     No Known Allergies  ROS     Objective:    Physical Exam  Skin:  On the right outer foot at the distal metatarsal head he has a thickened callus area with a circular indentation in the middle most consistent with a corn.   Procedure: #15 blade used to debride the callus and corn.  Liquid nitrogen used to treat the lesion.  Encourage patient to follow-up by using over-the-counter corn callus treatment liquid which is usually salicylic acid with a cushioned pad.  If not improving over the next few weeks then please let me know.   BP 122/62   Pulse 84   Ht 5\' 6"  (1.676 m)   Wt 161 lb (73 kg)   SpO2 99%   BMI 25.99 kg/m  Wt Readings from Last 3 Encounters:  02/11/19 161 lb (73 kg)  02/02/19 162 lb (73.5 kg)  12/23/18 161 lb 12.8 oz (73.4 kg)    There are no preventive care reminders to display for this patient.  There are no preventive care reminders to display for this patient.   Lab Results  Component Value Date   TSH 1.556 04/13/2014   Lab Results  Component Value Date   WBC 8.2 04/13/2014   HGB 14.1 04/13/2014   HCT 40.4 04/13/2014   MCV 94.0 04/13/2014   PLT 298 04/13/2014   Lab Results  Component Value Date   NA 137 02/02/2019   K 4.0 02/02/2019   CO2 27 02/02/2019   GLUCOSE 93 02/02/2019   BUN 14 02/02/2019    CREATININE 1.03 02/02/2019   BILITOT 0.6 02/02/2019   ALKPHOS 71 12/31/2016   AST 20 02/02/2019   ALT 13 02/02/2019   PROT 7.6 02/02/2019   ALBUMIN 4.6 12/31/2016   CALCIUM 9.8 02/02/2019   Lab Results  Component Value Date   CHOL 183 02/02/2019   Lab Results  Component Value Date   HDL 106 02/02/2019   Lab Results  Component Value Date   LDLCALC 64 02/02/2019   Lab Results  Component Value Date   TRIG 44 02/02/2019   Lab Results  Component Value Date   CHOLHDL 1.7 02/02/2019   No results found for: HGBA1C     Assessment & Plan:   Problem List Items Addressed This Visit    None    Visit Diagnoses    Corn of foot    -  Primary     See note above.  Encourage patient to follow-up by using over-the-counter corn callus treatment liquid which is usually salicylic acid with a cushioned pad.  If not improving over the next few weeks then please let me know.   No orders of the defined types were placed in this encounter.    Nani Gasser, MD

## 2019-02-13 ENCOUNTER — Ambulatory Visit: Payer: 59 | Admitting: Family Medicine

## 2019-02-17 ENCOUNTER — Encounter: Payer: Self-pay | Admitting: Family Medicine

## 2019-02-17 ENCOUNTER — Telehealth: Payer: 59 | Admitting: Physician Assistant

## 2019-02-17 DIAGNOSIS — J111 Influenza due to unidentified influenza virus with other respiratory manifestations: Secondary | ICD-10-CM

## 2019-02-17 DIAGNOSIS — R509 Fever, unspecified: Secondary | ICD-10-CM

## 2019-02-17 DIAGNOSIS — M791 Myalgia, unspecified site: Secondary | ICD-10-CM | POA: Diagnosis not present

## 2019-02-17 DIAGNOSIS — R69 Illness, unspecified: Secondary | ICD-10-CM | POA: Diagnosis not present

## 2019-02-17 MED ORDER — NAPROXEN 500 MG PO TABS: 500.0000 mg | ORAL_TABLET | Freq: Two times a day (BID) | ORAL | 0 refills | Status: DC

## 2019-02-17 NOTE — Progress Notes (Signed)
visit for Flu like symptoms  We are sorry that you are not feeling well.Here is how we plan to help! Based on what you have shared with me it looks like you may have flu-like symptoms that should be watched but do not seem to indicate anti-viral treatment.  Influenza or "the flu" is an infection caused by a respiratory virus. The flu virus is highly contagious and persons who did not receive their yearly flu vaccination may "catch" the flu from close contact.  We have anti-viral medications to treat the viruses that cause this infection. They are not a "cure" and only shorten the course of the infection. These prescriptions are most effective when they are given within the first 2 days of "flu" symptoms. Antiviral medication are indicated if you have a high risk of complications from the flu. You should also consider an antiviral medication if you are in close contact with someone who is at risk. These medications can help patients avoid complications from the flubut have side effects that you should know. Possible side effects from Tamiflu or oseltamivir include nausea, vomiting, diarrhea, dizziness, headaches, eye redness, sleep problems or other respiratory symptoms. You should not take Tamiflu if you have an allergy to oseltamivir or any to the ingredients in Tamiflu.  Based upon your symptoms and potential risk factors I recommend that you follow the flu symptoms recommendation that I have listed below. Also, I have prescribed a prescription strength pain reliever and fever reducer.It is waiting at your pharmacy.   ANYONE WHO HAS FLU SYMPTOMS SHOULD: Stay home. The flu is highly contagious and going out or to work exposes others!  Be sure to drink plenty of fluids. Water is fine as well as fruit juices, sodas and electrolyte beverages. You may want to stay away from caffeine or alcohol. If you are nauseated, try taking small sips of liquids. How do you know if you are getting enough  fluid? Your urine should be a pale yellow or almost colorless. Get rest. Taking a steamy shower or using a humidifier may help nasal congestion and ease sore throat pain. Using a saline nasal spray works much the same way. Cough drops, hard candies and sore throat lozenges may ease your cough. Line up a caregiver. Have someone check on you regularly.   GET HELP RIGHT AWAY IF: You cannot keep down liquids or your medications. You become short of breath Your fell like you are going to pass out or loose consciousness. Your symptoms persist after you have completed your treatment plan MAKE SURE YOU  Understand these instructions. Will watch your condition. Will get help right away if you are not doing well or get worse.  Your e-visit answers were reviewed by a board certified advanced clinical practitioner to complete your personal care plan.Depending on the condition, your plan could have included both over the counter or prescription medications.  If there is a problem please reply once you have received a response from your provider.  Your safety is important to [place].If you have drug allergies check your prescription carefully.  You can use MyChart to ask questions about today's visit, request a non-urgent call back, or ask for a work or school excuse for 24 hours related to this e-Visit. If it has been greater than 24 hours you will need to follow up with your provider, or enter a new e-Visit to address those concerns.  You will get an e-mail in the next two days asking about your experience.I hope  that your e-visit has been valuable and will speed your recovery. Thank you for using e-visits.

## 2019-04-27 ENCOUNTER — Encounter: Payer: Self-pay | Admitting: Sports Medicine

## 2019-04-27 DIAGNOSIS — M5412 Radiculopathy, cervical region: Secondary | ICD-10-CM

## 2019-04-27 MED ORDER — PREDNISONE 50 MG PO TABS: ORAL_TABLET | ORAL | 0 refills | Status: DC

## 2019-04-27 NOTE — Telephone Encounter (Signed)
I spent 5 total minutes of online digital evaluation and management services. 

## 2019-04-27 NOTE — Assessment & Plan Note (Signed)
Good relief from left-sided C6-C7 interlaminar epidural back in 2018. Having recurrence of discomfort, adding prednisone, we will proceed with another set of epidurals if insufficient improvement.

## 2019-06-11 ENCOUNTER — Encounter: Payer: Self-pay | Admitting: Sports Medicine

## 2019-06-11 DIAGNOSIS — M5412 Radiculopathy, cervical region: Secondary | ICD-10-CM

## 2019-06-12 MED ORDER — PREDNISONE 50 MG PO TABS: ORAL_TABLET | ORAL | 0 refills | Status: DC

## 2019-06-12 NOTE — Telephone Encounter (Signed)
Please contact  imaging for scheduling of epidural 

## 2019-06-12 NOTE — Telephone Encounter (Signed)
Left pt info on Roberta's VM to schedule

## 2019-06-13 ENCOUNTER — Telehealth: Payer: 59 | Admitting: Family

## 2019-06-13 DIAGNOSIS — K591 Functional diarrhea: Secondary | ICD-10-CM

## 2019-06-13 NOTE — Progress Notes (Signed)
We are sorry that you are not feeling well.  Here is how we plan to help!  Based on what you have shared with me it looks like you have Acute Infectious Diarrhea.  It is not likely that you have COVID.  Most cases of acute diarrhea are due to infections with virus and bacteria and are self-limited conditions lasting less than 14 days.  For your symptoms you may take Imodium 2 mg tablets that are over the counter at your local pharmacy. Take two tablet now and then one after each loose stool up to 6 a day.  Antibiotics are not needed for most people with diarrhea.   HOME CARE  We recommend changing your diet to help with your symptoms for the next few days.  Drink plenty of fluids that contain water salt and sugar. Sports drinks such as Gatorade may help.   You may try broths, soups, bananas, applesauce, soft breads, mashed potatoes or crackers.   You are considered infectious for as long as the diarrhea continues. Hand washing or use of alcohol based hand sanitizers is recommend.  It is best to stay out of work or school until your symptoms stop.   GET HELP RIGHT AWAY  If you have dark yellow colored urine or do not pass urine frequently you should drink more fluids.    If your symptoms worsen   If you feel like you are going to pass out (faint)  You have a new problem  MAKE SURE YOU   Understand these instructions.  Will watch your condition.  Will get help right away if you are not doing well or get worse.  Your e-visit answers were reviewed by a board certified advanced clinical practitioner to complete your personal care plan.  Depending on the condition, your plan could have included both over the counter or prescription medications.  If there is a problem please reply  once you have received a response from your provider.  Your safety is important to [place].  If you have drug allergies check your prescription carefully.    You can use MyChart to ask questions about  today's visit, request a non-urgent call back, or ask for a work or school excuse for 24 hours related to this e-Visit. If it has been greater than 24 hours you will need to follow up with your provider, or enter a new e-Visit to address those concerns.   You will get an e-mail in the next two days asking about your experience.  I hope that your e-visit has been valuable and will speed your recovery. Thank you for using e-visits.  Greater than 5 minutes, yet less than 10 minutes of time have been spent researching, coordinating, and implementing care for this patient today.  Thank you for the details you included in the comment boxes. Those details are very helpful in determining the best course of treatment for you and help [place] to provide the best care.

## 2019-06-14 ENCOUNTER — Encounter: Payer: Self-pay | Admitting: Family Medicine

## 2019-06-15 ENCOUNTER — Telehealth (INDEPENDENT_AMBULATORY_CARE_PROVIDER_SITE_OTHER): Payer: 59 | Admitting: Family Medicine

## 2019-06-15 ENCOUNTER — Encounter: Payer: Self-pay | Admitting: Family Medicine

## 2019-06-15 VITALS — Temp 98.6°F | Ht 66.0 in

## 2019-06-15 DIAGNOSIS — R197 Diarrhea, unspecified: Secondary | ICD-10-CM

## 2019-06-15 DIAGNOSIS — R509 Fever, unspecified: Secondary | ICD-10-CM | POA: Diagnosis not present

## 2019-06-15 NOTE — Telephone Encounter (Signed)
I think we should test him for COVID.  Can you put him on for evisit with me this afternoon?

## 2019-06-15 NOTE — Progress Notes (Signed)
Called pt he stated that his temp was 100.0 and yesterday it got as high as 101.5. he has been taking immodium and this has helped for the diarrhea some and he used tylenol for the fever.  He denies any other sxs.    His last episode of diarrhea was an hour ago and he stated anytime he tries to eat anything he has to go to the bathroom.  Denies any exposure to anyone who was sick.   Marland KitchenMaryruth Eve, Lahoma Crocker, CMA

## 2019-06-15 NOTE — Progress Notes (Signed)
Virtual Visit via Video Note  I connected with Gabriel Juarez on 06/15/19 at  3:20 PM EDT by a video enabled telemedicine application and verified that I am speaking with the correct person using two identifiers.   I discussed the limitations of evaluation and management by telemedicine and the availability of in person appointments. The patient expressed understanding and agreed to proceed.  Patient was sitting in his car and I was in my office for this visit.  Subjective:    CC: Diarrhea and Fever   HPI: 48 year old male started having diarrhea on Thursday.  He says is been happening every time he eats he has a loose stool.  Sometimes it is watery sometimes is just more mushy.  Then starting Friday night he actually started running a temperature. his temp was 100.0 and yesterday it got as high as 101.5. he has been taking immodium and this has helped for the diarrhea some and he used tylenol for the fever.  He denies any other sxs.  He denies any sore throat, shortness of breath or cough.  Denies any runny nose or congestion.  He has noticed a raspiness to his voice but says he had to wear a mask for about 3 days last week and wonders if it was just related to that.   His last episode of diarrhea was an hour ago and he stated anytime he tries to eat anything he has to go to the bathroom.  Little bit of red blood today after his bowel movement.  Denies any exposure to anyone who was sick.  He is unable to return to work while he is still febrile and is concerned about going back and potentially exposing people if he does have Kendrick.    Past medical history, Surgical history, Family history not pertinant except as noted below, Social history, Allergies, and medications have been entered into the medical record, reviewed, and corrections made.   Review of Systems: No fevers, chills, night sweats, weight loss, chest pain, or shortness of breath.   Objective:    General: Speaking clearly  in complete sentences without any shortness of breath.  Alert and oriented x3.  Normal judgment. No apparent acute distress.well groomed.     Impression and Recommendations:   Diarrhea/fever- suspect COVID. Will schedule for testing.  Referral sent to the [facility] testing pool.  Hopefully can get him tested sometime tomorrow morning.  Explained that he would have to stay home and quarantine until results came back.  Work note entered into the system.  Did encourage him to decrease the use of the Imodium.  Explained that sometimes it can prolong symptoms.  Just make sure staying hydrated.  And if at any point he feels like is getting worse or has persistent fever the please let us know.       I discussed the assessment and treatment plan with the patient. The patient was provided an opportunity to ask questions and all were answered. The patient agreed with the plan and demonstrated an understanding of the instructions.   The patient was advised to call back or seek an in-person evaluation if the symptoms worsen or if the condition fails to improve as anticipated.   Beatrice Lecher, MD

## 2019-06-15 NOTE — Telephone Encounter (Signed)
Patient scheduled.

## 2019-06-16 ENCOUNTER — Telehealth: Payer: Self-pay | Admitting: *Deleted

## 2019-06-16 ENCOUNTER — Other Ambulatory Visit: Payer: Self-pay

## 2019-06-16 DIAGNOSIS — Z20822 Contact with and (suspected) exposure to covid-19: Secondary | ICD-10-CM

## 2019-06-16 NOTE — Telephone Encounter (Signed)
-----   Message from Hali Marry, MD sent at 06/15/2019  3:38 PM EDT ----- Please call patient to schedule testing for COVID.  Patient with diarrhea for 5 days and fever for 4 days.  Highest temperature yesterday was 101.5.  Unable to return to work until he is tested.

## 2019-06-16 NOTE — Telephone Encounter (Signed)
Pt scheduled today @ 3:30 @ [facility]. Instructions given and order placed

## 2019-06-22 ENCOUNTER — Encounter: Payer: Self-pay | Admitting: Family Medicine

## 2019-06-22 LAB — NOVEL CORONAVIRUS, NAA: SARS-CoV-2, NAA: NOT DETECTED

## 2019-06-23 NOTE — Telephone Encounter (Signed)
H

## 2019-06-29 ENCOUNTER — Ambulatory Visit
Admission: RE | Admit: 2019-06-29 | Discharge: 2019-06-29 | Disposition: A | Discharge status: Home / Self Care | Payer: 59 | Source: Ambulatory Visit | Attending: Sports Medicine | Admitting: Sports Medicine

## 2019-06-29 ENCOUNTER — Other Ambulatory Visit: Payer: Self-pay

## 2019-06-29 MED ORDER — IOPAMIDOL (ISOVUE-M 300) INJECTION 61%
1.0000 mL | Freq: Once | INTRAMUSCULAR | Status: AC | PRN
  Administered 2019-06-29: 09:00:00 1 mL via EPIDURAL

## 2019-06-29 MED ORDER — TRIAMCINOLONE ACETONIDE 40 MG/ML IJ SUSP (RADIOLOGY)
60.0000 mg | Freq: Once | INTRAMUSCULAR | Status: AC
  Administered 2019-06-29: 09:00:00 60 mg via EPIDURAL

## 2019-08-02 ENCOUNTER — Other Ambulatory Visit: Payer: Self-pay | Admitting: Family Medicine

## 2019-08-02 DIAGNOSIS — I10 Essential (primary) hypertension: Secondary | ICD-10-CM

## 2019-08-03 ENCOUNTER — Encounter: Payer: Self-pay | Admitting: *Deleted

## 2019-08-03 ENCOUNTER — Encounter: Payer: Self-pay | Admitting: Family Medicine

## 2019-08-03 ENCOUNTER — Other Ambulatory Visit: Payer: Self-pay

## 2019-08-03 ENCOUNTER — Ambulatory Visit (INDEPENDENT_AMBULATORY_CARE_PROVIDER_SITE_OTHER): Payer: 59 | Admitting: Family Medicine

## 2019-08-03 VITALS — BP 122/68 | HR 96 | Ht 66.0 in | Wt 162.0 lb

## 2019-08-03 DIAGNOSIS — I10 Essential (primary) hypertension: Secondary | ICD-10-CM

## 2019-08-03 DIAGNOSIS — R252 Cramp and spasm: Secondary | ICD-10-CM

## 2019-08-03 NOTE — Progress Notes (Signed)
Established Patient Office Visit  Subjective:  Patient ID: Gabriel Juarez, male    DOB: 1971/09/25  Age: 48 y.o. MRN: [id number]  CC:  Chief Complaint  Patient presents with  . Hypertension    HPI Gabriel Juarez presents for   Hypertension- Pt denies chest pain, SOB, dizziness, or heart palpitations.  Taking meds as directed w/o problems.  Denies medication side effects.    He also c/o of muscle cramping in the left hands x 3 months.  Then started in his lower leg about 2 weeks ago.  No trauma or injury. Says actually called out of work for 2 days bc it was so bad.  He is right handed.    No CP or SOB.  No fever or chills.     Past Medical History:  Diagnosis Date  . Hypertension     Past Surgical History:  Procedure Laterality Date  . NO PAST SURGERIES      Family History  Problem Relation Age of Onset  . Multiple sclerosis Mother   . Multiple sclerosis Brother   . Hypertension Father     Social History   Socioeconomic History  . Marital status: Single    Spouse name: Not on file  . Number of children: Not on file  . Years of education: Not on file  . Highest education level: Not on file  Occupational History  . Occupation: Buyer, retail: [employer]  . Financial resource strain: Not on file  . Food insecurity    Worry: Not on file    Inability: Not on file  . Transportation needs    Medical: Not on file    Non-medical: Not on file  Tobacco Use  . Smoking status: Never Smoker  . Smokeless tobacco: Never Used  Substance and Sexual Activity  . Alcohol use: No  . Drug use: No  . Sexual activity: Yes    Partners: Female  Lifestyle  . Physical activity    Days per week: Not on file    Minutes per session: Not on file  . Stress: Not on file  Relationships  . Social Herbalist on phone: Not on file    Gets together: Not on file    Attends religious service: Not on file    Active member of club or organization: Not on  file    Attends meetings of clubs or organizations: Not on file    Relationship status: Not on file  . Intimate partner violence    Fear of current or ex partner: Not on file    Emotionally abused: Not on file    Physically abused: Not on file    Forced sexual activity: Not on file  Other Topics Concern  . Not on file  Social History Narrative   No regular exercise but his job is physically active.     Outpatient Medications Prior to Visit  Medication Sig Dispense Refill  . amLODipine (NORVASC) 10 MG tablet TAKE 1 TABLET BY MOUTH EVERY DAY 90 tablet 1   No facility-administered medications prior to visit.     No Known Allergies  ROS Review of Systems    Objective:    Physical Exam  Constitutional: He is oriented to person, place, and time. He appears well-developed and well-nourished.  HENT:  Head: Normocephalic and atraumatic.  Cardiovascular: Normal rate, regular rhythm and normal heart sounds.  Pulmonary/Chest: Effort normal and breath sounds normal.  Neurological: He is  alert and oriented to person, place, and time.  Skin: Skin is warm and dry.  Psychiatric: He has a normal mood and affect. His behavior is normal.    BP 122/68   Pulse 96   Ht 5\' 6"  (1.676 m)   Wt 162 lb (73.5 kg)   SpO2 100%   BMI 26.15 kg/m  Wt Readings from Last 3 Encounters:  08/03/19 162 lb (73.5 kg)  02/11/19 161 lb (73 kg)  02/02/19 162 lb (73.5 kg)     There are no preventive care reminders to display for this patient.  There are no preventive care reminders to display for this patient.  Lab Results  Component Value Date   TSH 1.556 04/13/2014   Lab Results  Component Value Date   WBC 8.2 04/13/2014   HGB 14.1 04/13/2014   HCT 40.4 04/13/2014   MCV 94.0 04/13/2014   PLT 298 04/13/2014   Lab Results  Component Value Date   NA 137 02/02/2019   K 4.0 02/02/2019   CO2 27 02/02/2019   GLUCOSE 93 02/02/2019   BUN 14 02/02/2019   CREATININE 1.03 02/02/2019   BILITOT  0.6 02/02/2019   ALKPHOS 71 12/31/2016   AST 20 02/02/2019   ALT 13 02/02/2019   PROT 7.6 02/02/2019   ALBUMIN 4.6 12/31/2016   CALCIUM 9.8 02/02/2019   Lab Results  Component Value Date   CHOL 183 02/02/2019   Lab Results  Component Value Date   HDL 106 02/02/2019   Lab Results  Component Value Date   LDLCALC 64 02/02/2019   Lab Results  Component Value Date   TRIG 44 02/02/2019   Lab Results  Component Value Date   CHOLHDL 1.7 02/02/2019   No results found for: HGBA1C    Assessment & Plan:   Problem List Items Addressed This Visit      Cardiovascular and Mediastinum   Essential hypertension, benign - Primary    Well controlled. Continue current regimen. Follow up in  6 months.       Relevant Orders   COMPLETE METABOLIC PANEL WITH GFR   TSH   CK (Creatine Kinase)   Sedimentation rate   CBC with Differential/Platelet    Other Visit Diagnoses    Muscle cramp       Relevant Orders   COMPLETE METABOLIC PANEL WITH GFR   TSH   CK (Creatine Kinase)   Sedimentation rate   CBC with Differential/Platelet      No orders of the defined types were placed in this encounter.   Muscle cramping. Unclear etiology.  He did have severe diarrhea back in June and in fact we had actually COVID tested him around that time and he was negative.  He says several coworkers actually had very similar symptoms.  Darted initially with cramping in the left hand and has now progressed to his lower extremities as well.  Will check thyroid, CBC, muscle enzyme, check for low potassium as well as abnormal calcium levels.    Follow-up: Return in about 6 months (around 02/03/2020) for cpe.    Nani Gasseratherine Ariq Khamis, MD

## 2019-08-03 NOTE — Assessment & Plan Note (Signed)
Well controlled. Continue current regimen. Follow up in  6 months.  

## 2019-08-04 LAB — CBC WITH DIFFERENTIAL/PLATELET
Absolute Monocytes: 723 cells/uL (ref 200–950)
Basophils Absolute: 51 cells/uL (ref 0–200)
Basophils Relative: 0.7 %
Eosinophils Absolute: 29 cells/uL (ref 15–500)
Eosinophils Relative: 0.4 %
HCT: 42.4 % (ref 38.5–50.0)
Hemoglobin: 14.4 g/dL (ref 13.2–17.1)
Lymphs Abs: 2927 cells/uL (ref 850–3900)
MCH: 33.2 pg — ABNORMAL HIGH (ref 27.0–33.0)
MCHC: 34 g/dL (ref 32.0–36.0)
MCV: 97.7 fL (ref 80.0–100.0)
MPV: 9.4 fL (ref 7.5–12.5)
Monocytes Relative: 9.9 %
Neutro Abs: 3570 cells/uL (ref 1500–7800)
Neutrophils Relative %: 48.9 %
Platelets: 300 10*3/uL (ref 140–400)
RBC: 4.34 10*6/uL (ref 4.20–5.80)
RDW: 12.1 % (ref 11.0–15.0)
Total Lymphocyte: 40.1 %
WBC: 7.3 10*3/uL (ref 3.8–10.8)

## 2019-08-04 LAB — COMPLETE METABOLIC PANEL WITH GFR
AG Ratio: 1.5 (calc) (ref 1.0–2.5)
ALT: 20 U/L (ref 9–46)
AST: 24 U/L (ref 10–40)
Albumin: 4.2 g/dL (ref 3.6–5.1)
Alkaline phosphatase (APISO): 81 U/L (ref 36–130)
BUN: 15 mg/dL (ref 7–25)
CO2: 28 mmol/L (ref 20–32)
Calcium: 9.6 mg/dL (ref 8.6–10.3)
Chloride: 105 mmol/L (ref 98–110)
Creat: 1.03 mg/dL (ref 0.60–1.35)
GFR, Est African American: 99 mL/min/{1.73_m2} (ref >=60)
GFR, Est Non African American: 85 mL/min/{1.73_m2} (ref >=60)
Globulin: 2.8 g/dL (calc) (ref 1.9–3.7)
Glucose, Bld: 88 mg/dL (ref 65–99)
Potassium: 4.8 mmol/L (ref 3.5–5.3)
Sodium: 139 mmol/L (ref 135–146)
Total Bilirubin: 0.5 mg/dL (ref 0.2–1.2)
Total Protein: 7 g/dL (ref 6.1–8.1)

## 2019-08-04 LAB — CK: Total CK: 225 U/L — ABNORMAL HIGH (ref 44–196)

## 2019-08-04 LAB — SEDIMENTATION RATE: Sed Rate: 2 mm/h (ref 0–15)

## 2019-08-04 LAB — TSH: TSH: 0.97 mIU/L (ref 0.40–4.50)

## 2019-08-31 ENCOUNTER — Ambulatory Visit (INDEPENDENT_AMBULATORY_CARE_PROVIDER_SITE_OTHER): Payer: 59 | Admitting: Family Medicine

## 2019-08-31 ENCOUNTER — Other Ambulatory Visit: Payer: Self-pay

## 2019-08-31 DIAGNOSIS — Z23 Encounter for immunization: Secondary | ICD-10-CM | POA: Diagnosis not present

## 2019-09-06 ENCOUNTER — Encounter: Payer: Self-pay | Admitting: Family Medicine

## 2019-09-07 ENCOUNTER — Telehealth: Payer: Self-pay | Admitting: *Deleted

## 2019-09-07 NOTE — Telephone Encounter (Signed)
Pt left vm stating that the epidural head a couple months ago but recently he's started having some pretty intense muscle spasms and he wanted to know what could be done. Please advise.

## 2019-09-07 NOTE — Telephone Encounter (Signed)
Next step is epidural #2 of 3 if okay with him.

## 2019-09-08 ENCOUNTER — Encounter: Payer: Self-pay | Admitting: Family Medicine

## 2019-09-08 DIAGNOSIS — M791 Myalgia, unspecified site: Secondary | ICD-10-CM

## 2019-09-08 DIAGNOSIS — R252 Cramp and spasm: Secondary | ICD-10-CM

## 2019-09-08 MED ORDER — CYCLOBENZAPRINE HCL 10 MG PO TABS: ORAL_TABLET | ORAL | 0 refills | Status: DC

## 2019-09-08 NOTE — Telephone Encounter (Signed)
Patient is not able to get to the next epidural until next year as he does not have vacation time to take off with his job. He knows this is the best option but he wants something to give him some relief.   He is requesting a note the 09/21 to 09/22 for his back spasms to excuse him from any missed work for Monday and today, with a return date on 09/09/2019. Please advise.

## 2019-09-08 NOTE — Telephone Encounter (Signed)
Letter written, adding some Flexeril to relieve the spasms.

## 2019-09-08 NOTE — Telephone Encounter (Signed)
[employer] requesting a return call regarding the second epidural.

## 2019-09-09 LAB — CBC WITH DIFFERENTIAL/PLATELET
Absolute Monocytes: 798 cells/uL (ref 200–950)
Basophils Absolute: 42 cells/uL (ref 0–200)
Basophils Relative: 0.5 %
Eosinophils Absolute: 50 cells/uL (ref 15–500)
Eosinophils Relative: 0.6 %
HCT: 45 % (ref 38.5–50.0)
Hemoglobin: 15.4 g/dL (ref 13.2–17.1)
Lymphs Abs: 3528 cells/uL (ref 850–3900)
MCH: 33.3 pg — ABNORMAL HIGH (ref 27.0–33.0)
MCHC: 34.2 g/dL (ref 32.0–36.0)
MCV: 97.2 fL (ref 80.0–100.0)
MPV: 10 fL (ref 7.5–12.5)
Monocytes Relative: 9.5 %
Neutro Abs: 3982 cells/uL (ref 1500–7800)
Neutrophils Relative %: 47.4 %
Platelets: 304 10*3/uL (ref 140–400)
RBC: 4.63 10*6/uL (ref 4.20–5.80)
RDW: 12 % (ref 11.0–15.0)
Total Lymphocyte: 42 %
WBC: 8.4 10*3/uL (ref 3.8–10.8)

## 2019-09-09 LAB — ALDOLASE: Aldolase: 3.6 U/L (ref <=8.1)

## 2019-09-09 LAB — LACTATE DEHYDROGENASE: LDH: 113 U/L (ref 100–220)

## 2019-09-09 LAB — CK: Total CK: 151 U/L (ref 44–196)

## 2019-09-09 NOTE — Telephone Encounter (Signed)
Left brief VM that Dr. Darene Lamer has written the letter and that he also sent a prescription to the pharmacy. Patient was asked to call back with any questions.

## 2019-09-10 NOTE — Telephone Encounter (Signed)
Left VM for the patient that Dr. Darene Lamer has written the letter and sent a medication to the pharmacy. Patient was asked to call back with questions. This is a second attempt. Closing encounter.

## 2019-09-11 ENCOUNTER — Encounter: Payer: Self-pay | Admitting: Family Medicine

## 2019-09-11 DIAGNOSIS — R252 Cramp and spasm: Secondary | ICD-10-CM

## 2019-09-11 NOTE — Telephone Encounter (Signed)
Refe to Neuro placed.

## 2020-02-03 ENCOUNTER — Other Ambulatory Visit: Payer: Self-pay

## 2020-02-03 ENCOUNTER — Other Ambulatory Visit: Payer: Self-pay | Admitting: Family Medicine

## 2020-02-03 ENCOUNTER — Ambulatory Visit (INDEPENDENT_AMBULATORY_CARE_PROVIDER_SITE_OTHER): Payer: 59 | Admitting: Family Medicine

## 2020-02-03 ENCOUNTER — Encounter: Payer: Self-pay | Admitting: Family Medicine

## 2020-02-03 VITALS — BP 118/72 | HR 79 | Ht 66.0 in | Wt 161.0 lb

## 2020-02-03 DIAGNOSIS — G4733 Obstructive sleep apnea (adult) (pediatric): Secondary | ICD-10-CM | POA: Insufficient documentation

## 2020-02-03 DIAGNOSIS — Z0000 Encounter for general adult medical examination without abnormal findings: Secondary | ICD-10-CM | POA: Diagnosis not present

## 2020-02-03 DIAGNOSIS — I10 Essential (primary) hypertension: Secondary | ICD-10-CM

## 2020-02-03 DIAGNOSIS — G4700 Insomnia, unspecified: Secondary | ICD-10-CM | POA: Diagnosis not present

## 2020-02-03 MED ORDER — QUETIAPINE FUMARATE 50 MG PO TABS: 50.0000 mg | ORAL_TABLET | Freq: Every day | ORAL | 1 refills | Status: DC

## 2020-02-03 NOTE — Progress Notes (Signed)
CPE  Established Patient Office Visit  Subjective:  Patient ID: Gabriel Juarez, male    DOB: 1971-12-11  Age: 49 y.o. MRN: [id number]  CC:  Chief Complaint  Patient presents with  . Annual Exam    HPI Gabriel Juarez presents for CPE.  He is doing well overall but still struggling with sleep.  He reports that he was actually diagnosed with sleep apnea at one point and tried CPAP on 2 different occasions and was unsuccessful with it.  He has tried several sleep aids in the past as well.  He has tried Ambien, Lunesta, trazodone, Belsomra, Seroquel.   He has still been exercising at least 3 days a week mostly doing cardio.  He also reports that he actually had a colonoscopy done about 20 years ago somewhere at [place] he thinks it could have been [facility].    Past Medical History:  Diagnosis Date  . Hypertension     Past Surgical History:  Procedure Laterality Date  . NO PAST SURGERIES      Family History  Problem Relation Age of Onset  . Multiple sclerosis Mother   . Multiple sclerosis Brother   . Hypertension Father     Social History   Socioeconomic History  . Marital status: Single    Spouse name: Not on file  . Number of children: Not on file  . Years of education: Not on file  . Highest education level: Not on file  Occupational History  . 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 Smoking status: Never Smoker  . Smokeless tobacco: Never Used  Substance and Sexual Activity  . Alcohol use: No  . Drug use: No  . Sexual activity: Yes    Partners: Female  Other Topics Concern  . Not on file  Social History Narrative   No regular exercise but his job is physically active.    Social Determinants of Health   Financial Resource Strain:   . Difficulty of Paying Living Expenses: Not on file  Food Insecurity:   . Worried About Programme researcher, broadcasting/film/video in the Last Year: Not on file  . Ran Out of Food in the Last Year: Not on file  Transportation  Needs:   . Lack of Transportation (Medical): Not on file  . Lack of Transportation (Non-Medical): Not on file  Physical Activity:   . Days of Exercise per Week: Not on file  . Minutes of Exercise per Session: Not on file  Stress:   . Feeling of Stress : Not on file  Social Connections:   . Frequency of Communication with Friends and Family: Not on file  . Frequency of Social Gatherings with Friends and Family: Not on file  . Attends Religious Services: Not on file  . Active Member of Clubs or Organizations: Not on file  . Attends Banker Meetings: Not on file  . Marital Status: Not on file  Intimate Partner Violence:   . Fear of Current or Ex-Partner: Not on file  . Emotionally Abused: Not on file  . Physically Abused: Not on file  . Sexually Abused: Not on file    Outpatient Medications Prior to Visit  Medication Sig Dispense Refill  . amLODipine (NORVASC) 10 MG tablet TAKE 1 TABLET BY MOUTH EVERY DAY 90 tablet 1  . cyclobenzaprine (FLEXERIL) 10 MG tablet One half to one tab PO qHS, then increase gradually to one tab TID. 30 tablet 0   No facility-administered  medications prior to visit.    No Known Allergies  ROS Review of Systems    Objective:    Physical Exam  Constitutional: He is oriented to person, place, and time. He appears well-developed and well-nourished.  HENT:  Head: Normocephalic and atraumatic.  Right Ear: External ear normal.  Left Ear: External ear normal.  Nose: Nose normal.  Mouth/Throat: Oropharynx is clear and moist.  Eyes: Pupils are equal, round, and reactive to light. Conjunctivae and EOM are normal.  Neck: No thyromegaly present.  Cardiovascular: Normal rate, regular rhythm, normal heart sounds and intact distal pulses.  Pulmonary/Chest: Effort normal and breath sounds normal.  Abdominal: Soft. Bowel sounds are normal. He exhibits no distension and no mass. There is no abdominal tenderness. There is no rebound and no guarding.   Musculoskeletal:        General: Normal range of motion.     Cervical back: Normal range of motion and neck supple.  Lymphadenopathy:    He has no cervical adenopathy.  Neurological: He is alert and oriented to person, place, and time. He has normal reflexes.  Skin: Skin is warm and dry.  Psychiatric: He has a normal mood and affect. His behavior is normal. Judgment and thought content normal.    BP 118/72   Pulse 79   Ht 5\' 6"  (1.676 m)   Wt 161 lb (73 kg)   SpO2 100%   BMI 25.99 kg/m  Wt Readings from Last 3 Encounters:  02/03/20 161 lb (73 kg)  08/03/19 162 lb (73.5 kg)  02/11/19 161 lb (73 kg)     There are no preventive care reminders to display for this patient.  There are no preventive care reminders to display for this patient.  Lab Results  Component Value Date   TSH 0.97 08/03/2019   Lab Results  Component Value Date   WBC 8.4 09/08/2019   HGB 15.4 09/08/2019   HCT 45.0 09/08/2019   MCV 97.2 09/08/2019   PLT 304 09/08/2019   Lab Results  Component Value Date   NA 139 08/03/2019   K 4.8 08/03/2019   CO2 28 08/03/2019   GLUCOSE 88 08/03/2019   BUN 15 08/03/2019   CREATININE 1.03 08/03/2019   BILITOT 0.5 08/03/2019   ALKPHOS 71 12/31/2016   AST 24 08/03/2019   ALT 20 08/03/2019   PROT 7.0 08/03/2019   ALBUMIN 4.6 12/31/2016   CALCIUM 9.6 08/03/2019   Lab Results  Component Value Date   CHOL 183 02/02/2019   Lab Results  Component Value Date   HDL 106 02/02/2019   Lab Results  Component Value Date   LDLCALC 64 02/02/2019   Lab Results  Component Value Date   TRIG 44 02/02/2019   Lab Results  Component Value Date   CHOLHDL 1.7 02/02/2019   No results found for: HGBA1C    Assessment & Plan:   Problem List Items Addressed This Visit      Respiratory   OSA (obstructive sleep apnea)     Other   Insomnia   Relevant Medications   QUEtiapine (SEROQUEL) 50 MG tablet    Other Visit Diagnoses    Wellness examination    -   Primary   Relevant Orders   BASIC METABOLIC PANEL WITH GFR   Lipid panel   PSA     Keep up a regular exercise program and make sure you are eating a healthy diet Try to eat 4 servings of dairy a day, or if  you are lactose intolerant take a calcium with vitamin D daily.  Your vaccines are up to date.  Due for labs.    Meds ordered this encounter  Medications  . QUEtiapine (SEROQUEL) 50 MG tablet    Sig: Take 1-2 tablets (50-100 mg total) by mouth at bedtime.    Dispense:  60 tablet    Refill:  1    Follow-up: Return in about 6 months (around 08/02/2020) for Hypertension.    Beatrice Lecher, MD

## 2020-02-03 NOTE — Patient Instructions (Signed)

## 2020-02-04 LAB — LIPID PANEL
Cholesterol: 209 mg/dL — ABNORMAL HIGH (ref <200)
HDL: 118 mg/dL (ref >=40)
LDL Cholesterol (Calc): 77 mg/dL (calc)
Non-HDL Cholesterol (Calc): 91 mg/dL (calc) (ref <130)
Total CHOL/HDL Ratio: 1.8 (calc) (ref <5.0)
Triglycerides: 59 mg/dL (ref <150)

## 2020-02-04 LAB — BASIC METABOLIC PANEL WITH GFR
BUN: 16 mg/dL (ref 7–25)
CO2: 27 mmol/L (ref 20–32)
Calcium: 9.5 mg/dL (ref 8.6–10.3)
Chloride: 102 mmol/L (ref 98–110)
Creat: 1.01 mg/dL (ref 0.60–1.35)
GFR, Est African American: 101 mL/min/{1.73_m2} (ref >=60)
GFR, Est Non African American: 88 mL/min/{1.73_m2} (ref >=60)
Glucose, Bld: 103 mg/dL — ABNORMAL HIGH (ref 65–99)
Potassium: 4.1 mmol/L (ref 3.5–5.3)
Sodium: 137 mmol/L (ref 135–146)

## 2020-02-04 LAB — PSA: PSA: 0.6 ng/mL (ref <=4.0)

## 2020-02-25 ENCOUNTER — Other Ambulatory Visit: Payer: Self-pay | Admitting: Family Medicine

## 2020-02-25 DIAGNOSIS — G4700 Insomnia, unspecified: Secondary | ICD-10-CM

## 2020-03-07 ENCOUNTER — Encounter: Payer: Self-pay | Admitting: Family Medicine

## 2020-03-11 ENCOUNTER — Ambulatory Visit: Payer: 59 | Attending: Internal Medicine

## 2020-03-11 DIAGNOSIS — Z23 Encounter for immunization: Secondary | ICD-10-CM

## 2020-03-11 NOTE — Progress Notes (Signed)
   Covid-19 Vaccination Clinic  Name:  Mourad Cwikla    MRN: [id number] DOB: Apr 29, 1971  03/11/2020  Mr. Giammarino was observed post Covid-19 immunization for 15 minutes without incident. He was provided with Vaccine Information Sheet and instruction to access the V-Safe system.   Mr. Rattigan was instructed to call 911 with any severe reactions post vaccine: Marland Kitchen Difficulty breathing  . Swelling of face and throat  . A fast heartbeat  . A bad rash all over body  . Dizziness and weakness   Immunizations Administered    Name Date Dose VIS Date Route   Pfizer COVID-19 Vaccine 03/11/2020  1:33 PM 0.3 mL 11/27/2019 Intramuscular   Manufacturer: [employer], [employer]   Lot: [id number]   NDC: [address]-7

## 2020-03-22 ENCOUNTER — Encounter: Payer: Self-pay | Admitting: Family Medicine

## 2020-03-28 ENCOUNTER — Encounter: Payer: Self-pay | Admitting: Family Medicine

## 2020-03-28 ENCOUNTER — Other Ambulatory Visit: Payer: Self-pay

## 2020-03-28 ENCOUNTER — Ambulatory Visit (INDEPENDENT_AMBULATORY_CARE_PROVIDER_SITE_OTHER): Payer: 59 | Admitting: Family Medicine

## 2020-03-28 ENCOUNTER — Ambulatory Visit (INDEPENDENT_AMBULATORY_CARE_PROVIDER_SITE_OTHER): Payer: 59

## 2020-03-28 VITALS — BP 123/70 | HR 88 | Ht 66.0 in | Wt 165.0 lb

## 2020-03-28 DIAGNOSIS — R1032 Left lower quadrant pain: Secondary | ICD-10-CM | POA: Diagnosis not present

## 2020-03-28 DIAGNOSIS — M25531 Pain in right wrist: Secondary | ICD-10-CM

## 2020-03-28 NOTE — Progress Notes (Signed)
Established Patient Office Visit  Subjective:  Patient ID: Gabriel Juarez, male    DOB: 1971/09/07  Age: 49 y.o. MRN: [id number]  CC:  Chief Complaint  Patient presents with  . Groin Pain  . Wrist Pain    R wrist    HPI Gabriel Juarez presents for   He reports that about 3 wks ago he began to experience some sharp pain in his L groin area with certain movements. He said that it doesn't hurt all of the time and he hasn't noticed any swelling in the area.  He says when it hurts it is mostly just a sharp pain that just last a few seconds.  It is right over the groin crease anteriorly.  No radiation of pain no pain on the outside of the hip.  If he takes ibuprofen he notices its not as bad.  If he steps to turn or twist is when he will actually feel it he has not noticed any swelling or bulge in the groin area.  He also said that crockpot lid was falling off of a shelf and he was attempting to catch this with his R hand and began to have pain in his wrist soon afterwards. He has been using Ice/heat and IBU. He did notice some swelling. He does a lot of movement with his hands at work.  He has been wearing a brace at work and he says that that does help and provide support but as soon as he takes it off it starts to hurt again.  Again has been using ibuprofen.  Past Medical History:  Diagnosis Date  . Hypertension     Past Surgical History:  Procedure Laterality Date  . NO PAST SURGERIES      Family History  Problem Relation Age of Onset  . Multiple sclerosis Mother   . Multiple sclerosis Brother   . Hypertension Father     Social History   Socioeconomic History  . Marital status: Single    Spouse name: Not on file  . Number of children: Not on file  . Years of education: Not on file  . Highest education level: Not on file  Occupational History  . 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 Smoking status: Never Smoker  . Smokeless tobacco: Never Used   Substance and Sexual Activity  . Alcohol use: No  . Drug use: No  . Sexual activity: Yes    Partners: Female  Other Topics Concern  . Not on file  Social History Narrative   No regular exercise but his job is physically active.    Social Determinants of Health   Financial Resource Strain:   . Difficulty of Paying Living Expenses:   Food Insecurity:   . Worried About Programme researcher, broadcasting/film/video in the Last Year:   . Barista in the Last Year:   Transportation Needs:   . Freight forwarder (Medical):   Marland Kitchen Lack of Transportation (Non-Medical):   Physical Activity:   . Days of Exercise per Week:   . Minutes of Exercise per Session:   Stress:   . Feeling of Stress :   Social Connections:   . Frequency of Communication with Friends and Family:   . Frequency of Social Gatherings with Friends and Family:   . Attends Religious Services:   . Active Member of Clubs or Organizations:   . Attends Banker Meetings:   Marland Kitchen Marital Status:  Intimate Partner Violence:   . Fear of Current or Ex-Partner:   . Emotionally Abused:   Marland Kitchen Physically Abused:   . Sexually Abused:     Outpatient Medications Prior to Visit  Medication Sig Dispense Refill  . amLODipine (NORVASC) 10 MG tablet TAKE 1 TABLET BY MOUTH EVERY DAY 90 tablet 1  . QUEtiapine (SEROQUEL) 50 MG tablet Take 1-2 tablets (50-100 mg total) by mouth at bedtime. Dx: G47.00 180 tablet 1   No facility-administered medications prior to visit.    No Known Allergies  ROS Review of Systems    Objective:    Physical Exam  Constitutional: He is oriented to person, place, and time. He appears well-developed and well-nourished.  HENT:  Head: Normocephalic and atraumatic.  Eyes: Conjunctivae and EOM are normal.  Cardiovascular: Normal rate.  Pulmonary/Chest: Effort normal.  Musculoskeletal:     Comments: Right wrist is mildly tender over the mid anterior wrist and over the mid posterior wrist.  He actually has some  mild swelling over the mid posterior wrist.  Normal flexion extension strength is 5 out of 5 in all directions.  Normal thumb range of motion as well as good grip.  He has pain over the left groin crease and pain directly over the hip flexor tendon.  Strength is 5 out of 5 at the hip knee and ankle.  Some discomfort with external rotation of the hip no pain with internal rotation.  Neurological: He is alert and oriented to person, place, and time.  Skin: Skin is dry. No pallor.  Psychiatric: He has a normal mood and affect. His behavior is normal.  Vitals reviewed.   BP 123/70   Pulse 88   Ht 5\' 6"  (1.676 m)   Wt 165 lb (74.8 kg)   SpO2 99%   BMI 26.63 kg/m  Wt Readings from Last 3 Encounters:  03/28/20 165 lb (74.8 kg)  02/03/20 161 lb (73 kg)  08/03/19 162 lb (73.5 kg)     There are no preventive care reminders to display for this patient.  There are no preventive care reminders to display for this patient.  Lab Results  Component Value Date   TSH 0.97 08/03/2019   Lab Results  Component Value Date   WBC 8.4 09/08/2019   HGB 15.4 09/08/2019   HCT 45.0 09/08/2019   MCV 97.2 09/08/2019   PLT 304 09/08/2019   Lab Results  Component Value Date   NA 137 02/03/2020   K 4.1 02/03/2020   CO2 27 02/03/2020   GLUCOSE 103 (H) 02/03/2020   BUN 16 02/03/2020   CREATININE 1.01 02/03/2020   BILITOT 0.5 08/03/2019   ALKPHOS 71 12/31/2016   AST 24 08/03/2019   ALT 20 08/03/2019   PROT 7.0 08/03/2019   ALBUMIN 4.6 12/31/2016   CALCIUM 9.5 02/03/2020   Lab Results  Component Value Date   CHOL 209 (H) 02/03/2020   Lab Results  Component Value Date   HDL 118 02/03/2020   Lab Results  Component Value Date   LDLCALC 77 02/03/2020   Lab Results  Component Value Date   TRIG 59 02/03/2020   Lab Results  Component Value Date   CHOLHDL 1.8 02/03/2020   No results found for: HGBA1C    Assessment & Plan:   Problem List Items Addressed This Visit    None    Visit  Diagnoses    Right wrist pain    -  Primary   Relevant Orders  DG Wrist Complete Right   Left groin pain         Right wrist pain-persistent x3 weeks without any specific major trauma or injury.  But he has been wearing a splint with a still having consistent pains we will go ahead and get x-ray today for further work-up.  I still suspect a tendinitis.  Groin pain-suspect again muscle/tendon injury.  Exercises given for rehab at home.  If not improving over the next 2 to 3 weeks then please let me know.  Okay to continue ibuprofen since it does seem to provide some relief.  No orders of the defined types were placed in this encounter.   Follow-up: No follow-ups on file.    Nani Gasser, MD

## 2020-03-28 NOTE — Progress Notes (Signed)
He reports that about 3 wks ago he began to experience some sharp pain in his L groin area with certain movements. He said that it doesn't hurt all of the time and he hasn't noticed any swelling in the area.  He also said that something was falling off of a shelf and he was attempting to catch this with his R hand and began to have pain in his wrist soon afterwards. He has been using Ice/heat and IBU. He did notice some swelling. He does a lot of movement with his hands at work.

## 2020-03-29 NOTE — Telephone Encounter (Signed)
Routing to provider  

## 2020-03-30 NOTE — Telephone Encounter (Signed)
Ok to return to work on 4/15 please provide note.

## 2020-04-04 ENCOUNTER — Encounter: Payer: Self-pay | Admitting: Family Medicine

## 2020-04-05 ENCOUNTER — Other Ambulatory Visit: Payer: Self-pay

## 2020-04-05 ENCOUNTER — Ambulatory Visit (INDEPENDENT_AMBULATORY_CARE_PROVIDER_SITE_OTHER): Payer: 59 | Admitting: Sports Medicine

## 2020-04-05 ENCOUNTER — Encounter: Payer: Self-pay | Admitting: Sports Medicine

## 2020-04-05 ENCOUNTER — Ambulatory Visit: Payer: 59 | Attending: Internal Medicine

## 2020-04-05 ENCOUNTER — Ambulatory Visit (INDEPENDENT_AMBULATORY_CARE_PROVIDER_SITE_OTHER): Payer: 59

## 2020-04-05 DIAGNOSIS — R1032 Left lower quadrant pain: Secondary | ICD-10-CM | POA: Insufficient documentation

## 2020-04-05 DIAGNOSIS — Z23 Encounter for immunization: Secondary | ICD-10-CM

## 2020-04-05 MED ORDER — MELOXICAM 15 MG PO TABS: ORAL_TABLET | ORAL | 3 refills | Status: DC

## 2020-04-05 NOTE — Progress Notes (Signed)
    Procedures performed today:    None.  Independent interpretation of notes and tests performed by another provider:   None.  Brief History, Exam, Impression, and Recommendations:    Left groin pain About 1 month of pain in his left groin, worse with stepping up on the left side. On exam he has only minimal pain with internal rotation of the hip, negative FADIR sign, he does have reproduction of pain with resisted hip flexion with the knee bent. 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 Continue hip flexor rehab exercises, adding meloxicam, baseline x-rays, return to see me in 1 month, iliopsoas tendon injection if no better.    ___________________________________________ [name]. Benjamin Stain, M.D., ABFM., CAQSM. Primary Care and Sports Medicine [facility]  Adjunct Instructor of Family Medicine  [employer] [facility] [employer]

## 2020-04-05 NOTE — Progress Notes (Signed)
   Covid-19 Vaccination Clinic  Name:  Gabriel Juarez    MRN: [id number] DOB: November 19, 1971  04/05/2020  Gabriel Juarez was observed post Covid-19 immunization for 15 minutes without incident. He was provided with Vaccine Information Sheet and instruction to access the V-Safe system.   Gabriel Juarez was instructed to call 911 with any severe reactions post vaccine: Marland Kitchen Difficulty breathing  . Swelling of face and throat  . A fast heartbeat  . A bad rash all over body  . Dizziness and weakness   Immunizations Administered    Name Date Dose VIS Date Route   Pfizer COVID-19 Vaccine 04/05/2020  8:03 AM 0.3 mL 02/10/2019 Intramuscular   Manufacturer: [employer], [employer]   Lot: [id number]   NDC: [address]-3

## 2020-04-05 NOTE — Assessment & Plan Note (Signed)
About 1 month of pain in his left groin, worse with stepping up on the left side. On exam he has only minimal pain with internal rotation of the hip, negative FADIR sign, he does have reproduction of pain with resisted hip flexion with the knee bent. 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 Continue hip flexor rehab exercises, adding meloxicam, baseline x-rays, return to see me in 1 month, iliopsoas tendon injection if no better.

## 2020-05-03 ENCOUNTER — Ambulatory Visit (INDEPENDENT_AMBULATORY_CARE_PROVIDER_SITE_OTHER): Payer: 59 | Admitting: Sports Medicine

## 2020-05-03 ENCOUNTER — Encounter: Payer: Self-pay | Admitting: Sports Medicine

## 2020-05-03 DIAGNOSIS — R1032 Left lower quadrant pain: Secondary | ICD-10-CM

## 2020-05-03 NOTE — Progress Notes (Signed)
    Procedures performed today:    None.  Independent interpretation of notes and tests performed by another provider:   None.  Brief History, Exam, Impression, and Recommendations:    Left groin pain Gabriel Juarez returns, he had a month of pain in his left groin, worse with stepping up, on exam of the last visit he had minimal pain with internal rotation, negative FADIR sign, and reproduction of pain with resisted hip flexion with the knee bent consistent with hip flexor tendinopathy. 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 He returns today pain-free. Exam is normal, he has good strength, return as needed.    ___________________________________________ [name]. Benjamin Stain, M.D., ABFM., CAQSM. Primary Care and Sports Medicine [facility]  Adjunct Instructor of Family Medicine  [employer]

## 2020-05-03 NOTE — Assessment & Plan Note (Signed)
Gabriel Juarez returns, he had a month of pain in his left groin, worse with stepping up, on exam of the last visit he had minimal pain with internal rotation, negative FADIR sign, and reproduction of pain with resisted hip flexion with the knee bent consistent with hip flexor tendinopathy. 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 He returns today pain-free. Exam is normal, he has good strength, return as needed.

## 2020-08-01 ENCOUNTER — Other Ambulatory Visit: Payer: Self-pay | Admitting: Family Medicine

## 2020-08-01 DIAGNOSIS — I10 Essential (primary) hypertension: Secondary | ICD-10-CM

## 2020-08-02 ENCOUNTER — Ambulatory Visit (INDEPENDENT_AMBULATORY_CARE_PROVIDER_SITE_OTHER): Payer: 59 | Admitting: Family Medicine

## 2020-08-02 ENCOUNTER — Encounter: Payer: Self-pay | Admitting: Family Medicine

## 2020-08-02 ENCOUNTER — Other Ambulatory Visit: Payer: Self-pay

## 2020-08-02 VITALS — BP 130/78 | HR 69 | Ht 66.0 in | Wt 162.0 lb

## 2020-08-02 DIAGNOSIS — F5101 Primary insomnia: Secondary | ICD-10-CM

## 2020-08-02 DIAGNOSIS — Z1211 Encounter for screening for malignant neoplasm of colon: Secondary | ICD-10-CM

## 2020-08-02 DIAGNOSIS — I10 Essential (primary) hypertension: Secondary | ICD-10-CM | POA: Diagnosis not present

## 2020-08-02 DIAGNOSIS — G4700 Insomnia, unspecified: Secondary | ICD-10-CM

## 2020-08-02 MED ORDER — QUETIAPINE FUMARATE 50 MG PO TABS: 50.0000 mg | ORAL_TABLET | Freq: Every day | ORAL | 1 refills | Status: DC

## 2020-08-02 NOTE — Progress Notes (Signed)
Established Patient Office Visit  Subjective:  Patient ID: Gabriel Juarez, male    DOB: 23-May-1971  Age: 49 y.o. MRN: [id number]  CC:  Chief Complaint  Patient presents with  . Hypertension    HPI Halston Kintz presents for   Hypertension- Pt denies chest pain, SOB, dizziness, or heart palpitations.  Taking meds as directed w/o problems.  Denies medication side effects.    Insomnia-currently using quetiapine at bedtime he says it is very effective.  But still struggles if he tries not to use it.  Past Medical History:  Diagnosis Date  . Hypertension     Past Surgical History:  Procedure Laterality Date  . NO PAST SURGERIES      Family History  Problem Relation Age of Onset  . Multiple sclerosis Mother   . Multiple sclerosis Brother   . Hypertension Father     Social History   Socioeconomic History  . Marital status: Single    Spouse name: Not on file  . Number of children: Not on file  . Years of education: Not on file  . Highest education level: Not on file  Occupational History  . 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 Smoking status: Never Smoker  . Smokeless tobacco: Never Used  Substance and Sexual Activity  . Alcohol use: No  . Drug use: No  . Sexual activity: Yes    Partners: Female  Other Topics Concern  . Not on file  Social History Narrative   No regular exercise but his job is physically active.    Social Determinants of Health   Financial Resource Strain:   . Difficulty of Paying Living Expenses:   Food Insecurity:   . Worried About Programme researcher, broadcasting/film/video in the Last Year:   . Barista in the Last Year:   Transportation Needs:   . Freight forwarder (Medical):   Marland Kitchen Lack of Transportation (Non-Medical):   Physical Activity:   . Days of Exercise per Week:   . Minutes of Exercise per Session:   Stress:   . Feeling of Stress :   Social Connections:   . Frequency of Communication with Friends and Family:    . Frequency of Social Gatherings with Friends and Family:   . Attends Religious Services:   . Active Member of Clubs or Organizations:   . Attends Banker Meetings:   Marland Kitchen Marital Status:   Intimate Partner Violence:   . Fear of Current or Ex-Partner:   . Emotionally Abused:   Marland Kitchen Physically Abused:   . Sexually Abused:     Outpatient Medications Prior to Visit  Medication Sig Dispense Refill  . amLODipine (NORVASC) 10 MG tablet TAKE 1 TABLET BY MOUTH EVERY DAY 90 tablet 1  . meloxicam (MOBIC) 15 MG tablet One tab PO qAM with a meal for 2 weeks, then daily prn pain. 30 tablet 3  . QUEtiapine (SEROQUEL) 50 MG tablet Take 1-2 tablets (50-100 mg total) by mouth at bedtime. Dx: G47.00 180 tablet 1   No facility-administered medications prior to visit.    No Known Allergies  ROS Review of Systems    Objective:    Physical Exam  BP 130/78   Pulse 69   Ht 5\' 6"  (1.676 m)   Wt 162 lb (73.5 kg)   SpO2 99%   BMI 26.15 kg/m  Wt Readings from Last 3 Encounters:  08/02/20 162 lb (73.5 kg)  04/05/20 165  lb (74.8 kg)  03/28/20 165 lb (74.8 kg)     Health Maintenance Due  Topic Date Due  . Hepatitis C Screening  Never done  . INFLUENZA VACCINE  07/17/2020    There are no preventive care reminders to display for this patient.  Lab Results  Component Value Date   TSH 0.97 08/03/2019   Lab Results  Component Value Date   WBC 8.4 09/08/2019   HGB 15.4 09/08/2019   HCT 45.0 09/08/2019   MCV 97.2 09/08/2019   PLT 304 09/08/2019   Lab Results  Component Value Date   NA 137 02/03/2020   K 4.1 02/03/2020   CO2 27 02/03/2020   GLUCOSE 103 (H) 02/03/2020   BUN 16 02/03/2020   CREATININE 1.01 02/03/2020   BILITOT 0.5 08/03/2019   ALKPHOS 71 12/31/2016   AST 24 08/03/2019   ALT 20 08/03/2019   PROT 7.0 08/03/2019   ALBUMIN 4.6 12/31/2016   CALCIUM 9.5 02/03/2020   Lab Results  Component Value Date   CHOL 209 (H) 02/03/2020   Lab Results  Component  Value Date   HDL 118 02/03/2020   Lab Results  Component Value Date   LDLCALC 77 02/03/2020   Lab Results  Component Value Date   TRIG 59 02/03/2020   Lab Results  Component Value Date   CHOLHDL 1.8 02/03/2020   No results found for: HGBA1C    Assessment & Plan:   Problem List Items Addressed This Visit      Cardiovascular and Mediastinum   Essential hypertension, benign - Primary    Well controlled. Continue current regimen. Follow up in  67mo. Labs are up to date        Other   Insomnia    Continue current regimen.  Follow-up in 6 months for CPE.      Relevant Medications   QUEtiapine (SEROQUEL) 50 MG tablet    Other Visit Diagnoses    Screen for colon cancer       Relevant Orders   Ambulatory referral to Gastroenterology     Discussed updated recommendation for colon cancer screening to start at age 102.  We will go ahead and place referral today.  Meds ordered this encounter  Medications  . QUEtiapine (SEROQUEL) 50 MG tablet    Sig: Take 1-2 tablets (50-100 mg total) by mouth at bedtime. Dx: G47.00    Dispense:  180 tablet    Refill:  1    Follow-up: Return in about 6 months (around 02/02/2021) for PHysical and bloodwork. Nani Gasser, MD

## 2020-08-02 NOTE — Assessment & Plan Note (Signed)
Continue current regimen.  Follow-up in 6 months for CPE.

## 2020-08-02 NOTE — Assessment & Plan Note (Signed)
Well controlled. Continue current regimen. Follow up in  6 mo . Labs are up to date.  

## 2020-08-09 ENCOUNTER — Other Ambulatory Visit: Payer: Self-pay | Admitting: Sports Medicine

## 2020-08-09 DIAGNOSIS — R1032 Left lower quadrant pain: Secondary | ICD-10-CM

## 2020-09-28 ENCOUNTER — Encounter: Payer: Self-pay | Admitting: Family Medicine

## 2020-12-19 ENCOUNTER — Encounter: Payer: Self-pay | Admitting: Family Medicine

## 2020-12-20 ENCOUNTER — Ambulatory Visit (INDEPENDENT_AMBULATORY_CARE_PROVIDER_SITE_OTHER): Payer: 59 | Admitting: Sports Medicine

## 2020-12-20 ENCOUNTER — Ambulatory Visit: Payer: 59 | Admitting: Sports Medicine

## 2020-12-20 ENCOUNTER — Ambulatory Visit (INDEPENDENT_AMBULATORY_CARE_PROVIDER_SITE_OTHER): Payer: 59

## 2020-12-20 ENCOUNTER — Other Ambulatory Visit: Payer: Self-pay

## 2020-12-20 DIAGNOSIS — M50321 Other cervical disc degeneration at C4-C5 level: Secondary | ICD-10-CM | POA: Diagnosis not present

## 2020-12-20 DIAGNOSIS — M5030 Other cervical disc degeneration, unspecified cervical region: Secondary | ICD-10-CM | POA: Diagnosis not present

## 2020-12-20 DIAGNOSIS — M50322 Other cervical disc degeneration at C5-C6 level: Secondary | ICD-10-CM | POA: Diagnosis not present

## 2020-12-20 MED ORDER — PREDNISONE 50 MG PO TABS: ORAL_TABLET | ORAL | 0 refills | Status: DC

## 2020-12-20 MED ORDER — CYCLOBENZAPRINE HCL 10 MG PO TABS: ORAL_TABLET | ORAL | 0 refills | Status: DC

## 2020-12-20 MED ORDER — HYDROCODONE-ACETAMINOPHEN 10-325 MG PO TABS: 1.0000 | ORAL_TABLET | Freq: Three times a day (TID) | ORAL | 0 refills | Status: DC | PRN

## 2020-12-20 NOTE — Assessment & Plan Note (Signed)
Victory returns, he is a pleasant 50 year old male with known cervical degenerative disc disease. Has responded well to epidurals the last of which was in July 2020. Unfortunately having recurrence of severe pain in the neck with radiation to the top of the thoracic spine but nothing radicular down the arms. We will start conservatively, prednisone, hydrocodone, Flexeril. It has been 6 years since we have had an MRI so because we are planning intervention we are going to get a new set of x-rays and cervical spine MRI. We will call [facility] with MRI results and if he still wants an epidural we will order it at that time.

## 2020-12-20 NOTE — Progress Notes (Signed)
    Procedures performed today:    None.  Independent interpretation of notes and tests performed by another provider:   I did personally review his MRI from 2015 that shows severe C4-C6 DDD with T2 edema in the C4 and C4 vertebral bodies.  Brief History, Exam, Impression, and Recommendations:    DDD (degenerative disc disease), cervical Gabriel Juarez returns, he is a pleasant 50 year old male with known cervical degenerative disc disease. Has responded well to epidurals the last of which was in July 2020. Unfortunately having recurrence of severe pain in the neck with radiation to the top of the thoracic spine but nothing radicular down the arms. We will start conservatively, prednisone, hydrocodone, Flexeril. It has been 6 years since we have had an MRI so because we are planning intervention we are going to get a new set of x-rays and cervical spine MRI. We will call Gabriel Juarez with MRI results and if he still wants an epidural we will order it at that time.    ___________________________________________ Gabriel Juarez. Benjamin Stain, M.D., ABFM., CAQSM. Primary Care and Sports Medicine  [facility]  Adjunct Instructor of Family Medicine  [employer]

## 2020-12-21 ENCOUNTER — Ambulatory Visit: Payer: 59 | Admitting: Sports Medicine

## 2020-12-28 MED ORDER — OXYCODONE-ACETAMINOPHEN 10-325 MG PO TABS: 1.0000 | ORAL_TABLET | Freq: Three times a day (TID) | ORAL | 0 refills | Status: DC | PRN

## 2020-12-31 ENCOUNTER — Other Ambulatory Visit: Payer: 59

## 2021-01-01 ENCOUNTER — Other Ambulatory Visit: Payer: 59

## 2021-01-07 ENCOUNTER — Other Ambulatory Visit: Payer: Self-pay

## 2021-01-07 ENCOUNTER — Ambulatory Visit (INDEPENDENT_AMBULATORY_CARE_PROVIDER_SITE_OTHER): Payer: 59

## 2021-01-07 DIAGNOSIS — M47812 Spondylosis without myelopathy or radiculopathy, cervical region: Secondary | ICD-10-CM | POA: Diagnosis not present

## 2021-01-07 DIAGNOSIS — M4802 Spinal stenosis, cervical region: Secondary | ICD-10-CM | POA: Diagnosis not present

## 2021-01-07 DIAGNOSIS — M5030 Other cervical disc degeneration, unspecified cervical region: Secondary | ICD-10-CM

## 2021-01-17 ENCOUNTER — Other Ambulatory Visit: Payer: Self-pay

## 2021-01-17 ENCOUNTER — Encounter: Payer: Self-pay | Admitting: Sports Medicine

## 2021-01-17 ENCOUNTER — Ambulatory Visit (INDEPENDENT_AMBULATORY_CARE_PROVIDER_SITE_OTHER): Payer: 59 | Admitting: Sports Medicine

## 2021-01-17 ENCOUNTER — Telehealth: Payer: Self-pay

## 2021-01-17 DIAGNOSIS — M5030 Other cervical disc degeneration, unspecified cervical region: Secondary | ICD-10-CM | POA: Diagnosis not present

## 2021-01-17 MED ORDER — OXYCODONE-ACETAMINOPHEN 10-325 MG PO TABS
1.0000 | ORAL_TABLET | Freq: Every day | ORAL | 0 refills | Status: DC | PRN
Start: 2021-01-17 — End: 2021-04-12

## 2021-01-17 NOTE — Progress Notes (Signed)
    Procedures performed today:    None.  Independent interpretation of notes and tests performed by another provider:   None.  Brief History, Exam, Impression, and Recommendations:    DDD (degenerative disc disease), cervical Still in pain, need to order the epidural, further information on the prior note from 12/20/2020. Refilling Percocet, he uses approximately 1/day. Return to see me 1 month after epidural.    ___________________________________________ [name]. Benjamin Stain, M.D., ABFM., CAQSM. Primary Care and Sports Medicine [facility] [facility]  Adjunct Instructor of Family Medicine  [employer]

## 2021-01-17 NOTE — Assessment & Plan Note (Addendum)
Still in pain, need to order the epidural, further information on the prior note from 12/20/2020. Refilling Percocet, he uses approximately 1/day. Return to see me 1 month after epidural.

## 2021-01-17 NOTE — Telephone Encounter (Signed)
Patient was seen this morning and forgot to get a note for work for today. He reports he will return to work tomorrow. Please make visible in mychart for him to print.

## 2021-01-17 NOTE — Telephone Encounter (Signed)
Left message on VM that letter completed and available through mychart.

## 2021-01-17 NOTE — Telephone Encounter (Signed)
Done

## 2021-01-18 ENCOUNTER — Other Ambulatory Visit: Payer: Self-pay | Admitting: Sports Medicine

## 2021-01-18 ENCOUNTER — Other Ambulatory Visit: Payer: Self-pay | Admitting: Family Medicine

## 2021-01-18 DIAGNOSIS — M5030 Other cervical disc degeneration, unspecified cervical region: Secondary | ICD-10-CM

## 2021-01-18 DIAGNOSIS — G4700 Insomnia, unspecified: Secondary | ICD-10-CM

## 2021-01-18 DIAGNOSIS — I10 Essential (primary) hypertension: Secondary | ICD-10-CM

## 2021-01-19 ENCOUNTER — Telehealth: Payer: Self-pay

## 2021-01-19 NOTE — Telephone Encounter (Signed)
Fax received from [facility] indicating patient's prescription for oxycodone needs a PA. Needs an updated insurance card for patient as the last one in the chart is from 2019.   Left message for patient to call office to get this information updated.

## 2021-01-20 ENCOUNTER — Telehealth: Payer: Self-pay | Admitting: Sports Medicine

## 2021-01-20 NOTE — Telephone Encounter (Signed)
Note from CoverMyMeds - This medication or product was previously approved on [id number] from 2021-01-20 to 2021-07-20. **Please note: Formulary lowering, tiering exception, cost reduction and/or pre-benefit determination review (including prospective Medicare hospice reviews) requests cannot be requested using this method of submission. Please contact us at [phone] instead.

## 2021-01-20 NOTE — Telephone Encounter (Signed)
Gabriel Juarez epidural was denied, please let him know that it looks like he is going to need to do physical therapy.  Have him let me know where he would like me to send the referral?  We can do it here if he wants.

## 2021-01-20 NOTE — Telephone Encounter (Signed)
Card received, PA submitted through covermymeds. Awaiting response.

## 2021-01-20 NOTE — Telephone Encounter (Signed)
Gabriel Juarez called and left a message for a return call. I called and left a message for a return call.

## 2021-01-23 NOTE — Telephone Encounter (Signed)
Patient reports that he is going to try to contact his insurance company himself and see if he can get them to reconsider due to this being a recurring issue.  He will let us know what, if anything, he can get accomplished.   Patient also asked if we have his FMLA paperwork. Will look tomorrow and let patient know.

## 2021-01-23 NOTE — Telephone Encounter (Signed)
Patient called to state that he spoke with his insurance company and they want to do a peer to peer regarding the epidural.   [phone] option 4  Patient aware the FMLA paperwork is here.

## 2021-01-24 DIAGNOSIS — M5030 Other cervical disc degeneration, unspecified cervical region: Secondary | ICD-10-CM

## 2021-01-24 MED ORDER — GABAPENTIN 300 MG PO CAPS: ORAL_CAPSULE | ORAL | 3 refills | Status: DC

## 2021-01-25 ENCOUNTER — Telehealth (INDEPENDENT_AMBULATORY_CARE_PROVIDER_SITE_OTHER): Payer: 59 | Admitting: Sports Medicine

## 2021-01-25 DIAGNOSIS — M5030 Other cervical disc degeneration, unspecified cervical region: Secondary | ICD-10-CM

## 2021-01-25 NOTE — Progress Notes (Signed)
   Virtual Visit via WebEx/MyChart   I connected with  Gabriel Juarez  on 01/25/21 via WebEx/MyChart/Doximity Video and verified that I am speaking with the correct person using two identifiers.   I discussed the limitations, risks, security and privacy concerns of performing an evaluation and management service by WebEx/MyChart/Doximity Video, including the higher likelihood of inaccurate diagnosis and treatment, and the availability of in person appointments.  We also discussed the likely need of an additional face to face encounter for complete and high quality delivery of care.  I also discussed with the patient that there may be a patient responsible charge related to this service. The patient expressed understanding and wishes to proceed.  Provider location is in medical facility. Patient location is at their home, different from provider location. People involved in care of the patient during this telehealth encounter were myself, my nurse/medical assistant, and my front office/scheduling team member.  Review of Systems: No fevers, chills, night sweats, weight loss, chest pain, or shortness of breath.   Objective Findings:    General: Speaking full sentences, no audible heavy breathing.  Sounds alert and appropriately interactive.  Appears well.  Face symmetric.  Extraocular movements intact.  Pupils equal and round.  No nasal flaring or accessory muscle use visualized.  Independent interpretation of tests performed by another provider:   None.  Brief History, Exam, Impression, and Recommendations:    DDD (degenerative disc disease), cervical Maverick has known cervical DDD, his epidural was denied, we do need to do a peer-to-peer, I did fill out additional FMLA paperwork today.   I discussed the above assessment and treatment plan with the patient. The patient was provided an opportunity to ask questions and all were answered. The patient agreed with the plan and demonstrated an  understanding of the instructions.   The patient was advised to call back or seek an in-person evaluation if the symptoms worsen or if the condition fails to improve as anticipated.   I provided 20 minutes of face to face and non-face-to-face time during this encounter date, time was needed to gather information, review chart, records, communicate/coordinate with staff remotely, as well as complete documentation.   ___________________________________________ [name]. Benjamin Stain, M.D., ABFM., CAQSM. Primary Care and Sports Medicine [place] [facility]  Adjunct Instructor of Family Medicine  [employer]

## 2021-01-25 NOTE — Assessment & Plan Note (Signed)
Bernie has known cervical DDD, his epidural was denied, we do need to do a peer-to-peer, I did fill out additional FMLA paperwork today.

## 2021-01-25 NOTE — Telephone Encounter (Signed)
Left message for Gabriel Juarez at [facility] to return call about approval.

## 2021-01-25 NOTE — Telephone Encounter (Signed)
Epidural peer to peer conducted today. Case# [id number] Approval: [id number], 01/25/21-07/24/21.  Please let [facility] and the patient know I got it approved.

## 2021-01-25 NOTE — Telephone Encounter (Signed)
Due to not hearing from [place], called back and left approval information on her VM. Asked that she get patient scheduled quickly.

## 2021-01-31 ENCOUNTER — Ambulatory Visit: Payer: 59 | Admitting: Sports Medicine

## 2021-02-02 ENCOUNTER — Encounter: Payer: 59 | Admitting: Family Medicine

## 2021-02-06 ENCOUNTER — Other Ambulatory Visit: Payer: Self-pay

## 2021-02-06 ENCOUNTER — Ambulatory Visit
Admission: RE | Admit: 2021-02-06 | Discharge: 2021-02-06 | Disposition: A | Discharge status: Home / Self Care | Payer: 59 | Source: Ambulatory Visit | Attending: Sports Medicine | Admitting: Sports Medicine

## 2021-02-06 DIAGNOSIS — M5030 Other cervical disc degeneration, unspecified cervical region: Secondary | ICD-10-CM

## 2021-02-06 MED ORDER — TRIAMCINOLONE ACETONIDE 40 MG/ML IJ SUSP (RADIOLOGY)
60.0000 mg | Freq: Once | INTRAMUSCULAR | Status: AC
  Administered 2021-02-06: 60 mg via EPIDURAL

## 2021-02-06 MED ORDER — IOPAMIDOL (ISOVUE-M 300) INJECTION 61%
1.0000 mL | Freq: Once | INTRAMUSCULAR | Status: AC
  Administered 2021-02-06: 1 mL via EPIDURAL

## 2021-02-06 NOTE — Discharge Instructions (Signed)

## 2021-02-23 ENCOUNTER — Ambulatory Visit (INDEPENDENT_AMBULATORY_CARE_PROVIDER_SITE_OTHER): Payer: 59 | Admitting: Family Medicine

## 2021-02-23 ENCOUNTER — Ambulatory Visit (INDEPENDENT_AMBULATORY_CARE_PROVIDER_SITE_OTHER): Payer: 59 | Admitting: Sports Medicine

## 2021-02-23 ENCOUNTER — Encounter: Payer: Self-pay | Admitting: Family Medicine

## 2021-02-23 ENCOUNTER — Other Ambulatory Visit: Payer: Self-pay

## 2021-02-23 VITALS — BP 120/65 | HR 88 | Ht 67.0 in | Wt 163.0 lb

## 2021-02-23 DIAGNOSIS — M5030 Other cervical disc degeneration, unspecified cervical region: Secondary | ICD-10-CM | POA: Diagnosis not present

## 2021-02-23 DIAGNOSIS — Z1211 Encounter for screening for malignant neoplasm of colon: Secondary | ICD-10-CM

## 2021-02-23 DIAGNOSIS — I10 Essential (primary) hypertension: Secondary | ICD-10-CM | POA: Diagnosis not present

## 2021-02-23 DIAGNOSIS — Z1159 Encounter for screening for other viral diseases: Secondary | ICD-10-CM

## 2021-02-23 DIAGNOSIS — Z0000 Encounter for general adult medical examination without abnormal findings: Secondary | ICD-10-CM

## 2021-02-23 DIAGNOSIS — R490 Dysphonia: Secondary | ICD-10-CM

## 2021-02-23 NOTE — Progress Notes (Signed)
    Procedures performed today:    None.  Independent interpretation of notes and tests performed by another provider:   None.  Brief History, Exam, Impression, and Recommendations:    DDD (degenerative disc disease), cervical Gabriel Juarez is a pleasant 50 year old male, he has known cervical DDD, ultimately we were able to get his epidural and MRI approved, he had partial relief, still has some axial neck pain but radicular symptoms have resolved, we will go for epidural #2 of 3 he can do a virtual follow-up 1 month after that.    ___________________________________________ Gabriel Juarez. Benjamin Stain, M.D., ABFM., CAQSM. Primary Care and Sports Medicine [place] [facility]  Adjunct Instructor of Family Medicine  [employer]

## 2021-02-23 NOTE — Progress Notes (Signed)
Established Patient Office Visit  Subjective:  Patient ID: Gabriel Juarez, male    DOB: 1971/08/31  Age: 50 y.o. MRN: [id number]  CC:  Chief Complaint  Patient presents with  . Annual Exam    HPI Gabriel Juarez presents for CPE.  He is doing well he reports that he stays active.  The only concern that he has today is that he has had voice hoarseness on and off for about 2 weeks and has been clearing his throat a lot so there is been a little excess mucus he said he even tried taking Mucinex but it really did not do anything.  He denies any pain in the throat he has not had any upper other upper respiratory symptoms currently or when this first started.  Past Medical History:  Diagnosis Date  . Hypertension     Past Surgical History:  Procedure Laterality Date  . NO PAST SURGERIES      Family History  Problem Relation Age of Onset  . Multiple sclerosis Mother   . Multiple sclerosis Brother   . Hypertension Father     Social History   Socioeconomic History  . Marital status: Single    Spouse name: Not on file  . Number of children: Not on file  . Years of education: Not on file  . Highest education level: Not on file  Occupational History  . 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 Smoking status: Never Smoker  . Smokeless tobacco: Never Used  Substance and Sexual Activity  . Alcohol use: No  . Drug use: No  . Sexual activity: Yes    Partners: Female  Other Topics Concern  . Not on file  Social History Narrative   No regular exercise but his job is physically active.    Social Determinants of Health   Financial Resource Strain: Not on file  Food Insecurity: Not on file  Transportation Needs: Not on file  Physical Activity: Not on file  Stress: Not on file  Social Connections: Not on file  Intimate Partner Violence: Not on file    Outpatient Medications Prior to Visit  Medication Sig Dispense Refill  . amLODipine (NORVASC) 10 MG  tablet Take 1 tablet (10 mg total) by mouth daily. Needs appt/labs 90 tablet 0  . cyclobenzaprine (FLEXERIL) 10 MG tablet ONE HALF TO ONE TAB BY MOUTH AT BEDTIME THEN INCREASE GRADUALLY TO TAKE 1 TABLET 3 TIMES A DAY 30 tablet 0  . gabapentin (NEURONTIN) 300 MG capsule One tab PO qHS for a week, then BID for a week, then TID. May double weekly to a max of 3,600mg /day 90 capsule 3  . meloxicam (MOBIC) 15 MG tablet ONE TAB BY MOUTH EVERY DAY IN THE MORNING WITH A MEAL FOR 2 WEEKS, THEN DAILY AS NEEDED PAIN. 30 tablet 3  . oxyCODONE-acetaminophen (PERCOCET) 10-325 MG tablet Take 1 tablet by mouth daily as needed for pain. 30 tablet 0  . QUEtiapine (SEROQUEL) 50 MG tablet Take 1-2 tablets (50-100 mg total) by mouth at bedtime. NEEDS APPT/Dx: G47.00 180 tablet 0   No facility-administered medications prior to visit.    No Known Allergies  ROS Review of Systems    Objective:    Physical Exam  BP 120/65   Pulse 88   Ht 5\' 6"  (1.676 m)   Wt 163 lb (73.9 kg)   SpO2 100%   BMI 26.31 kg/m  Wt Readings from Last 3 Encounters:  02/23/21 163  lb (73.9 kg)  08/02/20 162 lb (73.5 kg)  04/05/20 165 lb (74.8 kg)     Health Maintenance Due  Topic Date Due  . Hepatitis C Screening  Never done  . COLONOSCOPY (Pts 45-59yrs Insurance coverage will need to be confirmed)  Never done  . COVID-19 Vaccine (3 - Booster for Pfizer series) 10/05/2020    There are no preventive care reminders to display for this patient.  Lab Results  Component Value Date   TSH 0.97 08/03/2019   Lab Results  Component Value Date   WBC 8.4 09/08/2019   HGB 15.4 09/08/2019   HCT 45.0 09/08/2019   MCV 97.2 09/08/2019   PLT 304 09/08/2019   Lab Results  Component Value Date   NA 137 02/03/2020   K 4.1 02/03/2020   CO2 27 02/03/2020   GLUCOSE 103 (H) 02/03/2020   BUN 16 02/03/2020   CREATININE 1.01 02/03/2020   BILITOT 0.5 08/03/2019   ALKPHOS 71 12/31/2016   AST 24 08/03/2019   ALT 20 08/03/2019   PROT  7.0 08/03/2019   ALBUMIN 4.6 12/31/2016   CALCIUM 9.5 02/03/2020   Lab Results  Component Value Date   CHOL 209 (H) 02/03/2020   Lab Results  Component Value Date   HDL 118 02/03/2020   Lab Results  Component Value Date   LDLCALC 77 02/03/2020   Lab Results  Component Value Date   TRIG 59 02/03/2020   Lab Results  Component Value Date   CHOLHDL 1.8 02/03/2020   No results found for: HGBA1C    Assessment & Plan:   Problem List Items Addressed This Visit      Cardiovascular and Mediastinum   Essential hypertension, benign   Relevant Orders   Lipid Panel w/reflex Direct LDL   COMPLETE METABOLIC PANEL WITH GFR    Other Visit Diagnoses    Routine general medical examination at a health care facility    -  Primary   Relevant Orders   PSA   Lipid Panel w/reflex Direct LDL   COMPLETE METABOLIC PANEL WITH GFR   Hepatitis C antibody   Need for hepatitis C screening test       Relevant Orders   Hepatitis C antibody   Voice hoarseness       Screen for colon cancer       Relevant Orders   Ambulatory referral to Gastroenterology     Wellness Exam  Keep up a regular exercise program and make sure you are eating a healthy diet Try to eat 4 servings of dairy a day, or if you are lactose intolerant take a calcium with vitamin D daily.  Your vaccines are up to date.   Voice hoarseness-discussed a trial of a proton pump inhibitor as well as going ahead and starting his Claritin which she usually takes in the spring for allergies and postnasal drip.  Recommend a trial for at least 1 week if at that point he does not have any improvement in the drainage and voice hoarseness then he will give me a call back and will plan to refer to ENT for further work-up.  No orders of the defined types were placed in this encounter.     Follow-up: No follow-ups on file.    Nani Gasser, MD

## 2021-02-23 NOTE — Patient Instructions (Addendum)

## 2021-02-23 NOTE — Assessment & Plan Note (Signed)
Gabriel Juarez is a pleasant 50 year old male, he has known cervical DDD, ultimately we were able to get his epidural and MRI approved, he had partial relief, still has some axial neck pain but radicular symptoms have resolved, we will go for epidural #2 of 3 he can do a virtual follow-up 1 month after that.

## 2021-03-02 ENCOUNTER — Encounter: Payer: Self-pay | Admitting: Gastroenterology

## 2021-03-03 ENCOUNTER — Telehealth: Payer: Self-pay

## 2021-03-03 NOTE — Telephone Encounter (Signed)
[facility] called to report that they denied patient's second injection just like the first one. You had to do a peer-to-peer to get the first one approved. Victorino Dike is aware that you are out of the office on Friday and Monday.

## 2021-03-06 ENCOUNTER — Encounter: Payer: Self-pay | Admitting: Family Medicine

## 2021-03-06 DIAGNOSIS — R490 Dysphonia: Secondary | ICD-10-CM

## 2021-03-07 NOTE — Telephone Encounter (Signed)
Will place ENT referral.

## 2021-03-08 NOTE — Telephone Encounter (Signed)
Discussed with insurance company, peer-to-peer scheduled 03/09/2021 at 7:45 AM.

## 2021-03-09 NOTE — Telephone Encounter (Signed)
Left message on Jennifer's VM that a peer-to-peer had been conducted and left the approval number so she would proceed with scheduling patient.

## 2021-03-09 NOTE — Telephone Encounter (Signed)
Peer-to-peer conducted for Gabriel Juarez, the current criteria the insurance company is looking for is 50% relief of pain, decreased medication usage, improved function after initial epidural and a follow-up visit after the epidural to evaluate response.  Gabriel Juarez meets all of these, he is approved for epidural #2 of 3.  Authorized: [id number].  Please let [facility] know we have an approval and they can proceed with scheduling.

## 2021-03-17 ENCOUNTER — Other Ambulatory Visit: Payer: 59

## 2021-03-19 ENCOUNTER — Emergency Department
Admission: EM | Admit: 2021-03-19 | Discharge: 2021-03-19 | Disposition: A | Discharge status: Home / Self Care | Payer: 59 | Source: Home / Self Care

## 2021-03-19 ENCOUNTER — Other Ambulatory Visit: Payer: Self-pay

## 2021-03-19 ENCOUNTER — Emergency Department: Admit: 2021-03-19 | Discharge status: Absent without leave | Payer: Self-pay

## 2021-03-19 ENCOUNTER — Encounter: Payer: Self-pay | Admitting: Emergency Medicine

## 2021-03-19 DIAGNOSIS — S8391XA Sprain of unspecified site of right knee, initial encounter: Secondary | ICD-10-CM

## 2021-03-19 DIAGNOSIS — M25561 Pain in right knee: Secondary | ICD-10-CM

## 2021-03-19 MED ORDER — MELOXICAM 15 MG PO TABS: 15.0000 mg | ORAL_TABLET | Freq: Every day | ORAL | 0 refills | Status: DC

## 2021-03-19 NOTE — ED Triage Notes (Signed)
Left knee pain (sharp) 1 week  Denies any injury  No relief w. Ice or heat  Tylenol 650mg  TID  OTC - no relief - none today

## 2021-03-19 NOTE — Discharge Instructions (Addendum)
May have a meniscus tear vs sprain  Wear the brace when active for comfort  May take meloxicam daily for inflammation. Do not take ibuprofen with this medication  May take tylenol  May use ice and elevate the knee  Follow up with orthopedics if symptoms are persisting

## 2021-03-21 ENCOUNTER — Ambulatory Visit
Admission: RE | Admit: 2021-03-21 | Discharge: 2021-03-21 | Disposition: A | Discharge status: Home / Self Care | Payer: 59 | Source: Ambulatory Visit | Attending: Sports Medicine | Admitting: Sports Medicine

## 2021-03-21 ENCOUNTER — Other Ambulatory Visit: Payer: Self-pay

## 2021-03-21 DIAGNOSIS — M5030 Other cervical disc degeneration, unspecified cervical region: Secondary | ICD-10-CM

## 2021-03-21 MED ORDER — TRIAMCINOLONE ACETONIDE 40 MG/ML IJ SUSP (RADIOLOGY)
60.0000 mg | Freq: Once | INTRAMUSCULAR | Status: AC
  Administered 2021-03-21: 60 mg via EPIDURAL

## 2021-03-21 MED ORDER — IOPAMIDOL (ISOVUE-M 300) INJECTION 61%
1.0000 mL | Freq: Once | INTRAMUSCULAR | Status: AC | PRN
  Administered 2021-03-21: 1 mL via EPIDURAL

## 2021-03-21 NOTE — Discharge Instructions (Signed)

## 2021-03-24 ENCOUNTER — Other Ambulatory Visit: Payer: 59

## 2021-04-12 ENCOUNTER — Ambulatory Visit (INDEPENDENT_AMBULATORY_CARE_PROVIDER_SITE_OTHER): Payer: 59 | Admitting: Sports Medicine

## 2021-04-12 ENCOUNTER — Other Ambulatory Visit: Payer: Self-pay

## 2021-04-12 ENCOUNTER — Ambulatory Visit (AMBULATORY_SURGERY_CENTER): Payer: 59

## 2021-04-12 VITALS — Ht 67.0 in | Wt 165.0 lb

## 2021-04-12 DIAGNOSIS — Z1211 Encounter for screening for malignant neoplasm of colon: Secondary | ICD-10-CM

## 2021-04-12 DIAGNOSIS — M5030 Other cervical disc degeneration, unspecified cervical region: Secondary | ICD-10-CM | POA: Diagnosis not present

## 2021-04-12 MED ORDER — SUTAB 1479-225-188 MG PO TABS
12.0000 | ORAL_TABLET | ORAL | 0 refills | Status: DC
Start: 2021-04-12 — End: 2021-05-02

## 2021-04-12 NOTE — Progress Notes (Signed)
    Procedures performed today:    None.  Independent interpretation of notes and tests performed by another provider:   None.  Brief History, Exam, Impression, and Recommendations:    DDD (degenerative disc disease), cervical This pleasant 50 year old male returns, known cervical DDD, ultimately got his MRI and epidural approved, at the last month he had epidural #2 of 3 and he returns today pain-free. Return as needed. If symptoms return we can certainly order another epidural if his MRI is no more than 82 to 50 years old.    ___________________________________________ [name]. Benjamin Stain, M.D., ABFM., CAQSM. Primary Care and Sports Medicine [facility]  Adjunct Instructor of Family Medicine  [employer]

## 2021-04-12 NOTE — Assessment & Plan Note (Signed)
This pleasant 50 year old male returns, known cervical DDD, ultimately got his MRI and epidural approved, at the last month he had epidural #2 of 3 and he returns today pain-free. Return as needed. If symptoms return we can certainly order another epidural if his MRI is no more than 34 to 50 years old.

## 2021-04-12 NOTE — Progress Notes (Signed)
Pt verified name, DOB, address and insurance during PV today.   Pt mailed instruction packet-copy of consent form to read and not return, and instructions. Sutab coupon mailed in packet. PV completed over the phone. Pt encouraged to call with questions or issues.   No allergies to soy or egg Pt is not on blood thinners or diet pills Denies any surgeries or procedures-unable to assesssedation/intubation Denies atrial flutter/fib Denies constipation   Emmi instructions given to pt  Pt is aware of Covid safety and care partner requirements.

## 2021-04-16 ENCOUNTER — Other Ambulatory Visit: Payer: Self-pay | Admitting: Family Medicine

## 2021-04-16 DIAGNOSIS — G4700 Insomnia, unspecified: Secondary | ICD-10-CM

## 2021-04-20 ENCOUNTER — Other Ambulatory Visit: Payer: Self-pay | Admitting: Family Medicine

## 2021-04-20 DIAGNOSIS — I10 Essential (primary) hypertension: Secondary | ICD-10-CM

## 2021-04-26 ENCOUNTER — Encounter: Payer: Self-pay | Admitting: Gastroenterology

## 2021-05-02 ENCOUNTER — Ambulatory Visit (AMBULATORY_SURGERY_CENTER): Payer: 59 | Admitting: Gastroenterology

## 2021-05-02 ENCOUNTER — Other Ambulatory Visit: Payer: Self-pay

## 2021-05-02 ENCOUNTER — Encounter: Payer: Self-pay | Admitting: Gastroenterology

## 2021-05-02 VITALS — BP 128/88 | HR 83 | Temp 97.8°F | Resp 14 | Ht 67.0 in | Wt 165.0 lb

## 2021-05-02 DIAGNOSIS — Z86010 Personal history of colonic polyps: Secondary | ICD-10-CM | POA: Diagnosis present

## 2021-05-02 MED ORDER — SODIUM CHLORIDE 0.9 % IV SOLN: 500.0000 mL | Freq: Once | INTRAVENOUS | Status: DC

## 2021-05-02 NOTE — Progress Notes (Signed)
PT taken to PACU. Monitors in place. VSS. Report given to RN. 

## 2021-05-02 NOTE — Op Note (Signed)
[facility] Patient Name: Gabriel Juarez Procedure Date: 05/02/2021 9:10 AM MRN: [id number] Endoscopist: Viviann Spare P. Adela Lank , MD Age: 50 Referring MD:  Date of Birth: 11-26-1971 Gender: Male Account #: [id number] Procedure:                Colonoscopy Indications:              High risk colon cancer surveillance: Personal                            history of colonic polyps - patient reports polyps                            removed on prior colonoscopy - unclear date /                            findings Medicines:                Monitored Anesthesia Care Procedure:                Pre-Anesthesia Assessment:                           - Prior to the procedure, a History and Physical                            was performed, and patient medications and                            allergies were reviewed. The patient's tolerance of                            previous anesthesia was also reviewed. The risks                            and benefits of the procedure and the sedation                            options and risks were discussed with the patient.                            All questions were answered, and informed consent                            was obtained. Prior Anticoagulants: The patient has                            taken no previous anticoagulant or antiplatelet                            agents. ASA Grade Assessment: II - A patient with                            mild systemic disease. After reviewing the risks  and benefits, the patient was deemed in                            satisfactory condition to undergo the procedure.                           After obtaining informed consent, the colonoscope                            was passed under direct vision. Throughout the                            procedure, the patient's blood pressure, pulse, and                            oxygen saturations were monitored continuously. The                             Olympus CF-HQ190L [id number]) Colonoscope was                            introduced through the anus and advanced to the the                            cecum, identified by appendiceal orifice and                            ileocecal valve. The colonoscopy was performed                            without difficulty. The patient tolerated the                            procedure well. The quality of the bowel                            preparation was good. The ileocecal valve,                            appendiceal orifice, and rectum were photographed. Scope In: 9:16:50 AM Scope Out: 9:32:04 AM Scope Withdrawal Time: 0 hours 13 minutes 5 seconds  Total Procedure Duration: 0 hours 15 minutes 14 seconds  Findings:                 The perianal and digital rectal examinations were                            normal.                           Internal hemorrhoids were found during                            retroflexion. The hemorrhoids were moderate.  The exam was otherwise without abnormality. Complications:            No immediate complications. Estimated blood loss:                            None. Estimated Blood Loss:     Estimated blood loss: none. Impression:               - Internal hemorrhoids.                           - The examination was otherwise normal.                           - No polyps Recommendation:           - Patient has a contact number available for                            emergencies. The signs and symptoms of potential                            delayed complications were discussed with the                            patient. Return to normal activities tomorrow.                            Written discharge instructions were provided to the                            patient.                           - Resume previous diet.                           - Continue present medications.                           -  Repeat colonoscopy in 10 years for screening                            purposes. If able to obtain records of prior                            colonoscopy to confirm findings that may influence                            follow up surveillance interval Viviann Spare P. Elody Kleinsasser, MD 05/02/2021 9:37:40 AM This report has been signed electronically.

## 2021-05-02 NOTE — Patient Instructions (Signed)
NEXT COLONOSCOPY DUE IN 10 YEARS, 04/2031, you will receive a reminder letter at that time.  YOU HAD AN ENDOSCOPIC PROCEDURE TODAY AT THE [facility]:   Refer to the procedure report that was given to you for any specific questions about what was found during the examination.  If the procedure report does not answer your questions, please call your gastroenterologist to clarify.  If you requested that your care partner not be given the details of your procedure findings, then the procedure report has been included in a sealed envelope for you to review at your convenience later.  YOU SHOULD EXPECT: Some feelings of bloating in the abdomen. Passage of more gas than usual.  Walking can help get rid of the air that was put into your GI tract during the procedure and reduce the bloating. If you had a lower endoscopy (such as a colonoscopy or flexible sigmoidoscopy) you may notice spotting of blood in your stool or on the toilet paper. If you underwent a bowel prep for your procedure, you may not have a normal bowel movement for a few days.  Please Note:  You might notice some irritation and congestion in your nose or some drainage.  This is from the oxygen used during your procedure.  There is no need for concern and it should clear up in a day or so.  SYMPTOMS TO REPORT IMMEDIATELY:   Following lower endoscopy (colonoscopy or flexible sigmoidoscopy):  Excessive amounts of blood in the stool  Significant tenderness or worsening of abdominal pains  Swelling of the abdomen that is new, acute  Fever of 100F or higher  For urgent or emergent issues, a gastroenterologist can be reached at any hour by calling [phone]. Do not use MyChart messaging for urgent concerns.    DIET:  We do recommend a small meal at first, but then you may proceed to your regular diet.  Drink plenty of fluids but you should avoid alcoholic beverages for 24 hours.  ACTIVITY:  You should plan to take it easy  for the rest of today and you should NOT DRIVE or use heavy machinery until tomorrow (because of the sedation medicines used during the test).    FOLLOW UP: Our staff will call the number listed on your records 48-72 hours following your procedure to check on you and address any questions or concerns that you may have regarding the information given to you following your procedure. If we do not reach you, we will leave a message.  We will attempt to reach you two times.  During this call, we will ask if you have developed any symptoms of COVID 19. If you develop any symptoms (ie: fever, flu-like symptoms, shortness of breath, cough etc.) before then, please call [phone].  If you test positive for Covid 19 in the 2 weeks post procedure, please call and report this information to [place].     SIGNATURES/CONFIDENTIALITY: You and/or your care partner have signed paperwork which will be entered into your electronic medical record.  These signatures attest to the fact that that the information above on your After Visit Summary has been reviewed and is understood.  Full responsibility of the confidentiality of this discharge information lies with you and/or your care-partner.

## 2021-05-02 NOTE — Progress Notes (Signed)
Pt's states no medical or surgical changes since previsit or office visit. 

## 2021-05-04 ENCOUNTER — Telehealth: Payer: Self-pay

## 2021-05-04 NOTE — Telephone Encounter (Signed)
First attempt follow up call to pt, lm on vm 

## 2021-05-04 NOTE — Telephone Encounter (Signed)
Second attempt follow up call to pt, no answer.  

## 2021-07-15 ENCOUNTER — Other Ambulatory Visit: Payer: Self-pay | Admitting: Family Medicine

## 2021-07-15 DIAGNOSIS — I10 Essential (primary) hypertension: Secondary | ICD-10-CM

## 2021-11-02 ENCOUNTER — Ambulatory Visit (INDEPENDENT_AMBULATORY_CARE_PROVIDER_SITE_OTHER): Payer: 59

## 2021-11-02 ENCOUNTER — Ambulatory Visit: Payer: 59 | Admitting: Sports Medicine

## 2021-11-02 ENCOUNTER — Other Ambulatory Visit: Payer: Self-pay

## 2021-11-02 DIAGNOSIS — Z09 Encounter for follow-up examination after completed treatment for conditions other than malignant neoplasm: Secondary | ICD-10-CM

## 2021-11-02 DIAGNOSIS — M25462 Effusion, left knee: Secondary | ICD-10-CM

## 2021-11-02 DIAGNOSIS — M1712 Unilateral primary osteoarthritis, left knee: Secondary | ICD-10-CM | POA: Insufficient documentation

## 2021-11-02 NOTE — Assessment & Plan Note (Signed)
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 Aspiration and injection today, x-rays, reaction knee brace. Return to see me in 4 to 6 weeks, MRI if no better.

## 2021-11-02 NOTE — Progress Notes (Signed)
    Procedures performed today:    Procedure: Real-time Ultrasound Guided aspiration/injection of left knee Device: Samsung HS60  Verbal informed consent obtained.  Time-out conducted.  Noted no overlying erythema, induration, or other signs of local infection.  Skin prepped in a sterile fashion.  Local anesthesia: Topical Ethyl chloride.  With sterile technique and under real time ultrasound guidance: Noted effusion, aspirated 33 mL of clear, straw-colored fluid, syringe switched and 1 cc Kenalog 40, 2 cc lidocaine, 2 cc bupivacaine injected easily Completed without difficulty  Advised to call if fevers/chills, erythema, induration, drainage, or persistent bleeding.  Images permanently stored and available for review in PACS.  Impression: Technically successful ultrasound guided aspiration/injection.  Independent interpretation of notes and tests performed by another provider:   None.  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 Aspiration and injection today, x-rays, reaction knee brace. Return to see me in 4 to 6 weeks, MRI if no better.    ___________________________________________ Gabriel Juarez. Benjamin Stain, M.D., ABFM., CAQSM. Primary Care and Sports Medicine [facility]  Adjunct Instructor of Family Medicine  [employer]

## 2021-11-06 ENCOUNTER — Telehealth: Payer: Self-pay | Admitting: General Practice

## 2021-11-06 ENCOUNTER — Encounter: Payer: Self-pay | Admitting: Sports Medicine

## 2021-11-06 NOTE — Telephone Encounter (Signed)
Transition Care Management Unsuccessful Follow-up Telephone Call  Date of discharge and from where:  11/03/21 from [facility]  Attempts:  1st Attempt  Reason for unsuccessful TCM follow-up call:  Left voice message

## 2021-11-08 NOTE — Telephone Encounter (Signed)
Transition Care Management Unsuccessful Follow-up Telephone Call  Date of discharge and from where:  11/03/21 from [facility]  Attempts:  2nd Attempt  Reason for unsuccessful TCM follow-up call:  No answer/busy

## 2021-11-13 NOTE — Telephone Encounter (Signed)
Transition Care Management Unsuccessful Follow-up Telephone Call  Date of discharge and from where:  11/03/21 from [facility]  Attempts:  3rd Attempt  Reason for unsuccessful TCM follow-up call:  No answer/busy

## 2021-11-30 ENCOUNTER — Other Ambulatory Visit: Payer: Self-pay

## 2021-11-30 ENCOUNTER — Ambulatory Visit: Payer: 59 | Admitting: Sports Medicine

## 2021-11-30 DIAGNOSIS — M25462 Effusion, left knee: Secondary | ICD-10-CM | POA: Diagnosis not present

## 2021-11-30 MED ORDER — TRAMADOL HCL 50 MG PO TABS: 50.0000 mg | ORAL_TABLET | Freq: Three times a day (TID) | ORAL | 0 refills | Status: DC | PRN

## 2021-11-30 NOTE — Assessment & Plan Note (Signed)
Gabriel Juarez returns, he is a pleasant 50 year old male, we did a knee aspiration and injection at the last visit, severe knee pain and swelling. He returns today with significant improvement in discomfort but still has pain in his knee, catching, locking, grinding. Proceeding with MRI. Adding tramadol for pain in the meantime.

## 2021-11-30 NOTE — Progress Notes (Signed)
° ° °  Procedures performed today:    None.  Independent interpretation of notes and tests performed by another provider:   None.  Brief History, Exam, Impression, and Recommendations:    Effusion of knee joint, left Fred returns, he is a pleasant 50 year old male, we did a knee aspiration and injection at the last visit, severe knee pain and swelling. He returns today with significant improvement in discomfort but still has pain in his knee, catching, locking, grinding. Proceeding with MRI. Adding tramadol for pain in the meantime.    ___________________________________________ [name]. Benjamin Stain, M.D., ABFM., CAQSM. Primary Care and Sports Medicine [facility]  Adjunct Instructor of Family Medicine  [employer]

## 2021-12-09 ENCOUNTER — Ambulatory Visit (INDEPENDENT_AMBULATORY_CARE_PROVIDER_SITE_OTHER): Payer: 59

## 2021-12-09 ENCOUNTER — Other Ambulatory Visit: Payer: Self-pay

## 2021-12-09 DIAGNOSIS — M25462 Effusion, left knee: Secondary | ICD-10-CM

## 2021-12-12 ENCOUNTER — Telehealth: Payer: Self-pay | Admitting: Sports Medicine

## 2021-12-12 ENCOUNTER — Encounter: Payer: Self-pay | Admitting: Sports Medicine

## 2021-12-12 DIAGNOSIS — M1712 Unilateral primary osteoarthritis, left knee: Secondary | ICD-10-CM

## 2021-12-12 NOTE — Telephone Encounter (Signed)
[employer] approval please, for insurance purposes: xray and MRI confirmed OA, failed at least 2 NSAIDS, home PT for 6 weeks, steroid injection.  Left knee only.  Thank you!

## 2021-12-21 ENCOUNTER — Encounter: Payer: Self-pay | Admitting: Sports Medicine

## 2021-12-26 NOTE — Telephone Encounter (Signed)
[employer] paperwork faxed to [employer] at [phone] Request is for [facility] prefers [place] Fax confirmation receipt received

## 2021-12-29 ENCOUNTER — Ambulatory Visit: Payer: 59 | Admitting: Sports Medicine

## 2021-12-29 ENCOUNTER — Other Ambulatory Visit: Payer: Self-pay

## 2021-12-29 ENCOUNTER — Ambulatory Visit (INDEPENDENT_AMBULATORY_CARE_PROVIDER_SITE_OTHER): Payer: 59

## 2021-12-29 DIAGNOSIS — M1712 Unilateral primary osteoarthritis, left knee: Secondary | ICD-10-CM

## 2021-12-29 DIAGNOSIS — M25462 Effusion, left knee: Secondary | ICD-10-CM

## 2021-12-29 MED ORDER — TRAMADOL HCL 50 MG PO TABS: 50.0000 mg | ORAL_TABLET | Freq: Two times a day (BID) | ORAL | 0 refills | Status: DC | PRN

## 2021-12-29 NOTE — Assessment & Plan Note (Signed)
Pleasant 51 year old male, MRI shows only osteoarthritis, he has had aspirations and injections back in November. Persistent pain, effusions, arthrocentesis therapeutic today. Monovisc sounds like it is preferred but still waiting on approval, he also needs a smaller sleeve for his reaction knee brace. He will return once Monovisc or any other viscosupplementation officially approved.

## 2021-12-29 NOTE — Addendum Note (Signed)
Addended by: Monica Becton on: 12/29/2021 04:36 PM   Modules accepted: Orders

## 2021-12-29 NOTE — Progress Notes (Signed)
° ° °  Procedures performed today:    Procedure: Real-time Ultrasound Guided aspiration/injection of left knee Device: Samsung HS60  Verbal informed consent obtained.  Time-out conducted.  Noted no overlying erythema, induration, or other signs of local infection.  Skin prepped in a sterile fashion.  Local anesthesia: Topical Ethyl chloride.  With sterile technique and under real time ultrasound guidance: Noted effusion, using an 18-gauge needle aspirated 30 mils of clear, straw-colored fluid. Completed without difficulty  Advised to call if fevers/chills, erythema, induration, drainage, or persistent bleeding.  Images permanently stored and available for review in PACS.  Impression: Technically successful ultrasound guided arthrocentesis.  Independent interpretation of notes and tests performed by another provider:   None.  Brief History, Exam, Impression, and Recommendations:    Primary osteoarthritis of left knee Pleasant 51 year old male, MRI shows only osteoarthritis, he has had aspirations and injections back in November. Persistent pain, effusions, arthrocentesis therapeutic today. Monovisc sounds like it is preferred but still waiting on approval, he also needs a smaller sleeve for his reaction knee brace. He will return once Monovisc or any other viscosupplementation officially approved.    ___________________________________________ Gwen Her. Dianah Field, M.D., ABFM., CAQSM. Primary Care and [place] Instructor of [place] of [facility] of Medicine

## 2022-01-02 ENCOUNTER — Encounter: Payer: Self-pay | Admitting: Sports Medicine

## 2022-01-02 ENCOUNTER — Ambulatory Visit: Payer: 59 | Admitting: Sports Medicine

## 2022-01-04 NOTE — Telephone Encounter (Signed)
Benefits Investigation Details received from [employer] Injection: Monovisc  Medical: Deductible does not apply. Once the OOP has been met, patient is covered at 100%. Prior authorization is not required.  PA required: No  Pharmacy: The product is not covered under the pharmacy plan.  Specialty Pharmacy: [facility]  May fill through: [facility] [place] Copay/Coinsurance: None Product Copay: $50 Administration Coinsurance: None Administration Copay: None Out of Pocket Max: $4500 (met: $457.53)

## 2022-01-04 NOTE — Telephone Encounter (Signed)
[place] (1st attempt)    Pt needs to be informed of visco benefits. He will only have to pat $50 for the product copay. His insurance covers Monovisc and it needs to be filled through [place]. Rx has been tee'd up below and ready for review and approval/denial.

## 2022-01-04 NOTE — Addendum Note (Signed)
Addended by: Ranelle Oyster on: 01/04/2022 04:29 PM   Modules accepted: Orders

## 2022-01-05 MED ORDER — HYALURONAN 88 MG/4ML IX SOSY: PREFILLED_SYRINGE | INTRA_ARTICULAR | 1 refills | Status: DC

## 2022-01-05 NOTE — Telephone Encounter (Signed)
Rx signed off on, thank you for your hard work on this.

## 2022-01-05 NOTE — Addendum Note (Signed)
Addended by: Monica Becton on: 01/05/2022 09:19 AM   Modules accepted: Orders

## 2022-01-09 NOTE — Telephone Encounter (Signed)
Letter from [place] for approval of Monovisc.  Service dates: 12/26/2021-12/25/2022 Proc Code: [id number]  Spoke with patient, he is aware of his responsibility of the $50 copy and would like to proceed with the injection. Prescription has been ordered and patient is aware that he will be called when the medication has arrived at the office.

## 2022-01-19 MED ORDER — HYALURONAN 88 MG/4ML IX SOSY: PREFILLED_SYRINGE | INTRA_ARTICULAR | 1 refills | Status: DC

## 2022-01-19 NOTE — Telephone Encounter (Signed)
[facility] to check the order status of the Monovisc. They stated that patient does not have benefits through them. Patient must use [employer] (p - [phone] f - [phone]). Prescription sent to new pharmacy.

## 2022-01-19 NOTE — Addendum Note (Signed)
Addended by: Dema Severin on: 01/19/2022 11:54 AM   Modules accepted: Orders

## 2022-01-21 ENCOUNTER — Encounter: Payer: Self-pay | Admitting: Sports Medicine

## 2022-01-24 ENCOUNTER — Encounter: Payer: Self-pay | Admitting: Sports Medicine

## 2022-01-24 ENCOUNTER — Telehealth: Payer: Self-pay

## 2022-01-24 ENCOUNTER — Ambulatory Visit: Payer: 59 | Admitting: Sports Medicine

## 2022-01-24 NOTE — Telephone Encounter (Addendum)
Initiated Prior authorization for: Monovisc 88MG /4ML syringes Via: Covermymeds Case/Key:[id number] - [place] Case ID: [id number]  Status:Approved [id number] Duplicate request submitted by another user  Reason:orthrovisc,speciality Pharmacy Notified Pt via: Mychart,Called pt notifed pt we awaiting for medications to arrive

## 2022-01-25 NOTE — Telephone Encounter (Signed)
To christal

## 2022-01-26 ENCOUNTER — Ambulatory Visit (INDEPENDENT_AMBULATORY_CARE_PROVIDER_SITE_OTHER): Payer: 59

## 2022-01-26 ENCOUNTER — Other Ambulatory Visit: Payer: Self-pay

## 2022-01-26 ENCOUNTER — Ambulatory Visit: Payer: 59 | Admitting: Sports Medicine

## 2022-01-26 DIAGNOSIS — M1712 Unilateral primary osteoarthritis, left knee: Secondary | ICD-10-CM | POA: Diagnosis not present

## 2022-01-26 NOTE — Assessment & Plan Note (Signed)
Repeat aspiration and injection today, awaiting for the rep to drop off Monovisc and then we can do the injection.

## 2022-01-26 NOTE — Progress Notes (Signed)
° ° °  Procedures performed today:    Procedure: Real-time Ultrasound Guided aspiration/injection of left knee Device: Samsung HS60  Verbal informed consent obtained.  Time-out conducted.  Noted no overlying erythema, induration, or other signs of local infection.  Skin prepped in a sterile fashion.  Local anesthesia: Topical Ethyl chloride.  With sterile technique and under real time ultrasound guidance: Noted effusion, aspirated 20 mL of orange clear fluid, syringe switched and 1 cc Kenalog 40, 2 cc lidocaine, 2 cc bupivacaine injected easily Completed without difficulty  Advised to call if fevers/chills, erythema, induration, drainage, or persistent bleeding.  Images permanently stored and available for review in PACS.  Impression: Technically successful ultrasound guided aspiration/injection.  Independent interpretation of notes and tests performed by another provider:   None.  Brief History, Exam, Impression, and Recommendations:    Primary osteoarthritis of left knee Repeat aspiration and injection today, awaiting for the rep to drop off Monovisc and then we can do the injection.  Chronic process with exacerbation and pharmacologic intervention.  ___________________________________________ Gwen Her. Dianah Field, M.D., ABFM., CAQSM. Primary Care and [place] Instructor of [place] of [facility] of Medicine

## 2022-01-30 NOTE — Telephone Encounter (Signed)
Patient is scheduled for 2/20 

## 2022-02-05 ENCOUNTER — Ambulatory Visit: Payer: 59 | Admitting: Sports Medicine

## 2022-02-05 ENCOUNTER — Ambulatory Visit (INDEPENDENT_AMBULATORY_CARE_PROVIDER_SITE_OTHER): Payer: 59

## 2022-02-05 ENCOUNTER — Other Ambulatory Visit: Payer: Self-pay

## 2022-02-05 DIAGNOSIS — M1712 Unilateral primary osteoarthritis, left knee: Secondary | ICD-10-CM

## 2022-02-05 NOTE — Progress Notes (Signed)
° ° °  Procedures performed today:    Procedure: Real-time Ultrasound Guided aspiration/injection Device: Samsung HS60  Verbal informed consent obtained.  Time-out conducted.  Noted no overlying erythema, induration, or other signs of local infection.  Skin prepped in a sterile fashion.  Local anesthesia: Topical Ethyl chloride.  With sterile technique and under real time ultrasound guidance: Using 18-gauge needle aspirated 10 mL of clear, straw-colored fluid, syringe switched and 88 mg/4 mL of MonoVisc (sodium hyaluronate) in a prefilled syringe was injected easily into the knee. Completed without difficulty  Advised to call if fevers/chills, erythema, induration, drainage, or persistent bleeding.  Images permanently stored and available for review in PACS.  Impression: Technically successful ultrasound guided aspiration/injection.  Independent interpretation of notes and tests performed by another provider:   None.  Brief History, Exam, Impression, and Recommendations:    Primary osteoarthritis of left knee Aspiration and Monovisc injection today, if insufficient improvement we will have one of our surgeons weigh in for arthroscopic intervention.    ___________________________________________ Gwen Her. Dianah Field, M.D., ABFM., CAQSM. Primary Care and [place] Instructor of [employer] of [facility] of Medicine

## 2022-02-05 NOTE — Assessment & Plan Note (Signed)
Aspiration and Monovisc injection today, if insufficient improvement we will have one of our surgeons weigh in for arthroscopic intervention.

## 2022-02-28 ENCOUNTER — Ambulatory Visit (INDEPENDENT_AMBULATORY_CARE_PROVIDER_SITE_OTHER): Payer: 59 | Admitting: Family Medicine

## 2022-02-28 ENCOUNTER — Encounter: Payer: Self-pay | Admitting: Family Medicine

## 2022-02-28 ENCOUNTER — Other Ambulatory Visit: Payer: Self-pay

## 2022-02-28 VITALS — BP 137/81 | HR 91 | Ht 67.0 in | Wt 155.0 lb

## 2022-02-28 DIAGNOSIS — Z125 Encounter for screening for malignant neoplasm of prostate: Secondary | ICD-10-CM

## 2022-02-28 DIAGNOSIS — I10 Essential (primary) hypertension: Secondary | ICD-10-CM

## 2022-02-28 DIAGNOSIS — Z1159 Encounter for screening for other viral diseases: Secondary | ICD-10-CM | POA: Diagnosis not present

## 2022-02-28 DIAGNOSIS — Z23 Encounter for immunization: Secondary | ICD-10-CM

## 2022-02-28 DIAGNOSIS — Z0000 Encounter for general adult medical examination without abnormal findings: Secondary | ICD-10-CM

## 2022-02-28 DIAGNOSIS — G4733 Obstructive sleep apnea (adult) (pediatric): Secondary | ICD-10-CM

## 2022-02-28 DIAGNOSIS — K219 Gastro-esophageal reflux disease without esophagitis: Secondary | ICD-10-CM

## 2022-02-28 MED ORDER — AMBULATORY NON FORMULARY MEDICATION: 0 refills | Status: DC

## 2022-02-28 NOTE — Assessment & Plan Note (Signed)
We will try to get CPAP initiated again.  We will set to AutoPap between 5 and 15 cm of water pressure.  AHI was 13.1 based on home sleep study from 2018.  Please see scanned document. ?

## 2022-02-28 NOTE — Progress Notes (Signed)
? ?CPE - Established Patient Office Visit ? ?Subjective:  ?Patient ID: Gabriel Juarez, male    DOB: Dec 26, 1970  Age: 51 y.o. MRN: [id number] ? ?CC:  ?Chief Complaint  ?Patient presents with  ? Annual Exam  ? ? ?HPI ?Treylin Voet presents for CPE.  ? ?He is doing well overall.  But has been working a lot of long hours sometimes 50 to 70 hours/week.  He does report feeling down several days of the week but really feels like a lot of that is just getting older and dealing with some of the physical issues with getting older.  He does not feel like he needs any assistance or help at this point in time. ? ?Hypertension- Pt denies chest pain, SOB, dizziness, or heart palpitations.  Taking meds as directed w/o problems.  Denies medication side effects.   ? ?GERD -taking a PPI regularly. ? ?Still struggling with losing his voice.  He says it is always a little better in the morning and then gets worse as the day goes on he has been to ENT.  They treated him for tonsillar stones and they even did a scope.  They felt like it could be possibly related to his snoring and sleep apnea which she is not currently on treatment for. ? ?He reports that he was actually diagnosed with sleep apnea at one point and tried CPAP on 2 different occasions and was unsuccessful with it.  He has tried several sleep aids in the past as well.  He has tried Ambien, Lunesta, trazodone, Belsomra, Seroquel ? ?Last sleep study was in 2018 was a home sleep study with an AHI of 13.1.  They recommended to be set at AutoPap between 5 and 15 cm of water pressure. ? ?Past Medical History:  ?Diagnosis Date  ? Hypertension   ? ? ?Past Surgical History:  ?Procedure Laterality Date  ? NO PAST SURGERIES    ? ? ?Family History  ?Problem Relation Age of Onset  ? Multiple sclerosis Mother   ? Multiple sclerosis Brother   ? Hypertension Father   ? Healthy Sister   ? Healthy Sister   ? Colon cancer Neg Hx   ? Colon polyps Neg Hx   ? Esophageal cancer Neg Hx   ?  Rectal cancer Neg Hx   ? Stomach cancer Neg Hx   ? ? ?Social History  ? ?Socioeconomic History  ? Marital status: Single  ?  Spouse name: Not on file  ? Number of children: Not on file  ? Years of education: Not on file  ? Highest education level: Not on file  ?Occupational History  ? Occupation: Librarian, academic  ?  Employer: OTHER  ?Tobacco Use  ? Smoking status: Never  ? Smokeless tobacco: Never  ?Vaping Use  ? Vaping Use: Never used  ?Substance and Sexual Activity  ? Alcohol use: No  ? Drug use: No  ? Sexual activity: Yes  ?  Partners: Female  ?Other Topics Concern  ? Not on file  ?Social History Narrative  ? No regular exercise but his job is physically active.   ? ?Social Determinants of Health  ? ?Financial Resource Strain: Not on file  ?Food Insecurity: Not on file  ?Transportation Needs: Not on file  ?Physical Activity: Not on file  ?Stress: Not on file  ?Social Connections: Not on file  ?Intimate Partner Violence: Not on file  ? ? ?Outpatient Medications Prior to Visit  ?Medication Sig Dispense Refill  ? omeprazole (PRILOSEC)  40 MG capsule Take 40 mg by mouth daily.    ? traMADol (ULTRAM) 50 MG tablet Take 1 tablet (50 mg total) by mouth every 12 (twelve) hours as needed for moderate pain. 60 tablet 0  ? Hyaluronan 88 MG/4ML SOSY Inject 1 syringe into the knee x1. 4 mL 1  ? ?No facility-administered medications prior to visit.  ? ? ?No Known Allergies ? ?ROS ?Review of Systems ? ?  ?Objective:  ?  ?Physical Exam ?Constitutional:   ?   Appearance: Normal appearance. He is well-developed.  ?HENT:  ?   Head: Normocephalic and atraumatic.  ?   Right Ear: External ear normal.  ?   Left Ear: External ear normal.  ?   Nose: Nose normal.  ?Eyes:  ?   Conjunctiva/sclera: Conjunctivae normal.  ?   Pupils: Pupils are equal, round, and reactive to light.  ?Neck:  ?   Thyroid: No thyromegaly.  ?Cardiovascular:  ?   Rate and Rhythm: Normal rate and regular rhythm.  ?   Heart sounds: Normal heart sounds.  ?Pulmonary:  ?    Effort: Pulmonary effort is normal.  ?   Breath sounds: Normal breath sounds.  ?Abdominal:  ?   General: Bowel sounds are normal. There is no distension.  ?   Palpations: Abdomen is soft. There is no mass.  ?   Tenderness: There is no abdominal tenderness. There is no guarding or rebound.  ?Musculoskeletal:     ?   General: Normal range of motion.  ?   Cervical back: Normal range of motion and neck supple.  ?Lymphadenopathy:  ?   Cervical: No cervical adenopathy.  ?Skin: ?   General: Skin is warm and dry.  ?Neurological:  ?   Mental Status: He is alert and oriented to person, place, and time. Mental status is at baseline.  ?   Deep Tendon Reflexes: Reflexes are normal and symmetric.  ?Psychiatric:     ?   Behavior: Behavior normal.     ?   Thought Content: Thought content normal.     ?   Judgment: Judgment normal.  ? ?BP 137/81   Pulse 91   Ht 5\' 7"  (1.702 m)   Wt 155 lb (70.3 kg)   SpO2 97%   BMI 24.28 kg/m?  ?Wt Readings from Last 3 Encounters:  ?02/28/22 155 lb (70.3 kg)  ?05/02/21 165 lb (74.8 kg)  ?04/12/21 165 lb (74.8 kg)  ? ? ? ?Health Maintenance Due  ?Topic Date Due  ? Hepatitis C Screening  Never done  ? COVID-19 Vaccine (3 - Booster for Pfizer series) 05/31/2020  ? ? ?There are no preventive care reminders to display for this patient. ? ?Lab Results  ?Component Value Date  ? TSH 0.97 08/03/2019  ? ?Lab Results  ?Component Value Date  ? WBC 8.4 09/08/2019  ? HGB 15.4 09/08/2019  ? HCT 45.0 09/08/2019  ? MCV 97.2 09/08/2019  ? PLT 304 09/08/2019  ? ?Lab Results  ?Component Value Date  ? NA 137 02/03/2020  ? K 4.1 02/03/2020  ? CO2 27 02/03/2020  ? GLUCOSE 103 (H) 02/03/2020  ? BUN 16 02/03/2020  ? CREATININE 1.01 02/03/2020  ? BILITOT 0.5 08/03/2019  ? ALKPHOS 71 12/31/2016  ? AST 24 08/03/2019  ? ALT 20 08/03/2019  ? PROT 7.0 08/03/2019  ? ALBUMIN 4.6 12/31/2016  ? CALCIUM 9.5 02/03/2020  ? ?Lab Results  ?Component Value Date  ? CHOL 209 (H) 02/03/2020  ? ?Lab  Results  ?Component Value Date  ? HDL  118 02/03/2020  ? ?Lab Results  ?Component Value Date  ? [place] 77 02/03/2020  ? ?Lab Results  ?Component Value Date  ? TRIG 59 02/03/2020  ? ?Lab Results  ?Component Value Date  ? CHOLHDL 1.8 02/03/2020  ? ?No results found for: HGBA1C ? ?  ?Assessment & Plan:  ? ?Problem List Items Addressed This Visit   ? ?  ? Cardiovascular and Mediastinum  ? Essential hypertension, benign  ? Relevant Orders  ? PSA  ? Lipid panel  ? COMPLETE METABOLIC PANEL WITH GFR  ? CBC  ? Hepatitis C Antibody  ?  ? Respiratory  ? OSA (obstructive sleep apnea)  ?  We will try to get CPAP initiated again.  We will set to AutoPap between 5 and 15 cm of water pressure.  AHI was 13.1 based on home sleep study from 2018.  Please see scanned document. ?  ?  ? Relevant Medications  ? AMBULATORY NON FORMULARY MEDICATION  ?  ? Digestive  ? GERD (gastroesophageal reflux disease)  ? ?Other Visit Diagnoses   ? ? Wellness examination    -  Primary  ? Need for hepatitis C screening test      ? Relevant Orders  ? Hepatitis C Antibody  ? Screening for prostate cancer      ? Relevant Orders  ? PSA  ? Need for Zostavax administration      ? Relevant Orders  ? Varicella-zoster vaccine IM (Shingrix) (Completed)  ? ?  ? ? ?Keep up a regular exercise program and make sure you are eating a healthy diet ?Try to eat 4 servings of dairy a day, or if you are lactose intolerant take a calcium with vitamin D daily.  ?Your vaccines are up to date.  ? ?Meds ordered this encounter  ?Medications  ? AMBULATORY NON FORMULARY MEDICATION  ?  Sig: Medication Name: CPAP set to 5-15 cm water pressure.  AHI was 13.1.  ?  Dispense:  1 Units  ?  Refill:  0  ? ? ?Follow-up: Return in about 1 year (around 03/01/2023) for Wellness Exam.  ? ? ?Beatrice Lecher, MD ?

## 2022-03-05 ENCOUNTER — Ambulatory Visit (INDEPENDENT_AMBULATORY_CARE_PROVIDER_SITE_OTHER): Payer: 59 | Admitting: Sports Medicine

## 2022-03-05 ENCOUNTER — Other Ambulatory Visit: Payer: Self-pay

## 2022-03-05 DIAGNOSIS — M25462 Effusion, left knee: Secondary | ICD-10-CM | POA: Diagnosis not present

## 2022-03-05 DIAGNOSIS — M1712 Unilateral primary osteoarthritis, left knee: Secondary | ICD-10-CM | POA: Diagnosis not present

## 2022-03-05 MED ORDER — TRAMADOL HCL 50 MG PO TABS: 50.0000 mg | ORAL_TABLET | Freq: Two times a day (BID) | ORAL | 0 refills | Status: DC | PRN

## 2022-03-05 NOTE — Assessment & Plan Note (Signed)
This is a very pleasant 51 year old male, he has knee osteoarthritis, recurrent effusions, we did multiple episodes of aspirations, he had several injections, finally we got Monovisc approved at the last visit and we did the injection, he had 1 day of swelling and then symptoms have completely resolved. ?No pain, no swelling, happy with results, return as needed, we can certainly do another Monovisc injection in approximately 5 months if needed. ? ?Of note MRI showed intact menisci and cruciate ligaments, mostly cartilage fissuring and osteoarthritis. ?

## 2022-03-05 NOTE — Progress Notes (Signed)
? ? ?  Procedures performed today:   ? ?None. ? ?Independent interpretation of notes and tests performed by another provider:  ? ?None. ? ?Brief History, Exam, Impression, and Recommendations:   ? ?Primary osteoarthritis of left knee ?This is a very pleasant 51 year old male, he has knee osteoarthritis, recurrent effusions, we did multiple episodes of aspirations, he had several injections, finally we got Monovisc approved at the last visit and we did the injection, he had 1 day of swelling and then symptoms have completely resolved. ?No pain, no swelling, happy with results, return as needed, we can certainly do another Monovisc injection in approximately 5 months if needed. ? ?Of note MRI showed intact menisci and cruciate ligaments, mostly cartilage fissuring and osteoarthritis. ? ? ? ?___________________________________________ ?Gwen Her. Dianah Field, M.D., ABFM., CAQSM. ?Primary Care and Sports Medicine ?[place] ? ?Adjunct Instructor of Family Medicine  ?[employer] ?

## 2022-03-06 LAB — CBC
HCT: 41.7 % (ref 38.5–50.0)
Hemoglobin: 14.5 g/dL (ref 13.2–17.1)
MCH: 33.9 pg — ABNORMAL HIGH (ref 27.0–33.0)
MCHC: 34.8 g/dL (ref 32.0–36.0)
MCV: 97.4 fL (ref 80.0–100.0)
MPV: 9.5 fL (ref 7.5–12.5)
Platelets: 304 10*3/uL (ref 140–400)
RBC: 4.28 10*6/uL (ref 4.20–5.80)
RDW: 11.8 % (ref 11.0–15.0)
WBC: 6 10*3/uL (ref 3.8–10.8)

## 2022-03-06 LAB — LIPID PANEL
Cholesterol: 188 mg/dL (ref <200)
HDL: 105 mg/dL (ref >=40)
LDL Cholesterol (Calc): 69 mg/dL (calc)
Non-HDL Cholesterol (Calc): 83 mg/dL (calc) (ref <130)
Total CHOL/HDL Ratio: 1.8 (calc) (ref <5.0)
Triglycerides: 64 mg/dL (ref <150)

## 2022-03-06 LAB — COMPLETE METABOLIC PANEL WITH GFR
AG Ratio: 1.5 (calc) (ref 1.0–2.5)
ALT: 10 U/L (ref 9–46)
AST: 18 U/L (ref 10–35)
Albumin: 4.3 g/dL (ref 3.6–5.1)
Alkaline phosphatase (APISO): 71 U/L (ref 35–144)
BUN: 11 mg/dL (ref 7–25)
CO2: 26 mmol/L (ref 20–32)
Calcium: 9.2 mg/dL (ref 8.6–10.3)
Chloride: 103 mmol/L (ref 98–110)
Creat: 0.99 mg/dL (ref 0.70–1.30)
Globulin: 2.8 g/dL (calc) (ref 1.9–3.7)
Glucose, Bld: 88 mg/dL (ref 65–99)
Potassium: 4.3 mmol/L (ref 3.5–5.3)
Sodium: 138 mmol/L (ref 135–146)
Total Bilirubin: 0.7 mg/dL (ref 0.2–1.2)
Total Protein: 7.1 g/dL (ref 6.1–8.1)
eGFR: 93 mL/min/{1.73_m2} (ref >=60)

## 2022-03-06 LAB — PSA: PSA: 0.71 ng/mL (ref <=4.00)

## 2022-03-06 LAB — HEPATITIS C ANTIBODY
Hepatitis C Ab: NONREACTIVE
SIGNAL TO CUT-OFF: 0.09 (ref <1.00)

## 2022-03-06 NOTE — Progress Notes (Signed)
Hi Terrill, hemoglobin looks great.  Cholesterol looks great 2.  Prostate test is normal.  Metabolic panel including liver and kidney function is stable.

## 2022-03-06 NOTE — Progress Notes (Signed)
You are negative for hepatitis C

## 2022-03-07 ENCOUNTER — Encounter: Payer: Self-pay | Admitting: Sports Medicine

## 2022-03-07 NOTE — Telephone Encounter (Signed)
Patient has been scheduled with Dr T on 03/09/2022. AM ?

## 2022-03-07 NOTE — Telephone Encounter (Signed)
Yes it is okay to schedule. ?

## 2022-03-09 ENCOUNTER — Ambulatory Visit (INDEPENDENT_AMBULATORY_CARE_PROVIDER_SITE_OTHER): Payer: 59 | Admitting: Sports Medicine

## 2022-03-09 ENCOUNTER — Ambulatory Visit (INDEPENDENT_AMBULATORY_CARE_PROVIDER_SITE_OTHER): Payer: 59

## 2022-03-09 ENCOUNTER — Other Ambulatory Visit: Payer: Self-pay

## 2022-03-09 DIAGNOSIS — M1712 Unilateral primary osteoarthritis, left knee: Secondary | ICD-10-CM

## 2022-03-09 IMAGING — MR MR KNEE*L* W/O CM
7 series · 40 of 40 positions shown · non-contrast
Comparison: X-ray 11/02/2021

CLINICAL DATA: Anterior left knee pain and swelling. History of
joint aspiration

EXAM:
MRI OF THE LEFT KNEE WITHOUT CONTRAST
TECHNIQUE: Multiplanar, multisequence MR imaging of the knee was performed. No
intravenous contrast was administered.

[Series 3: T2 fat-sat · axial · 4.0mm · 0.50mm/px · z∈[-71,+79]mm · 8 of 31 slices shown (1 of 3)]
[im 1/31]
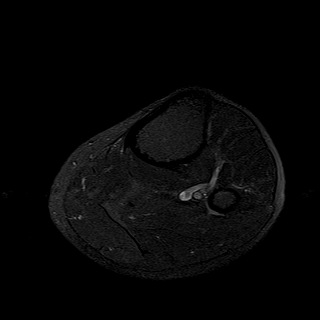
[im 5/31]
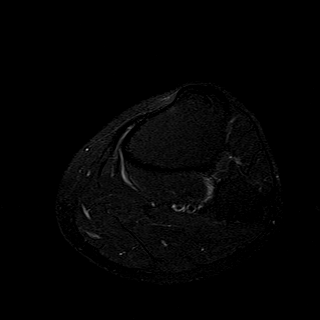
[im 9/31]
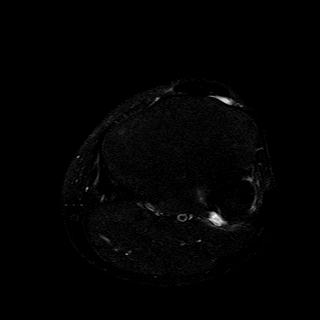
[im 13/31]
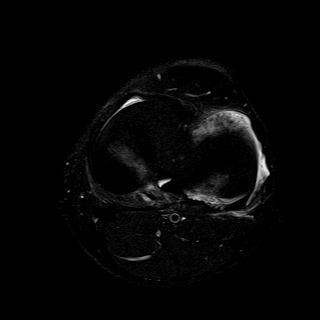
[im 18/31]
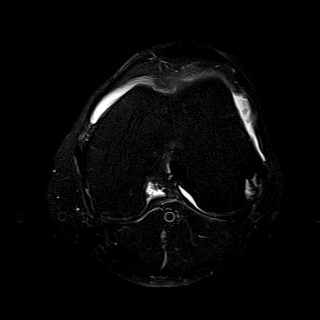
[im 22/31]
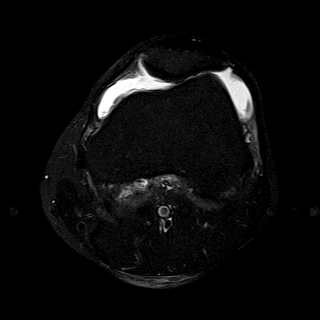
[im 26/31]
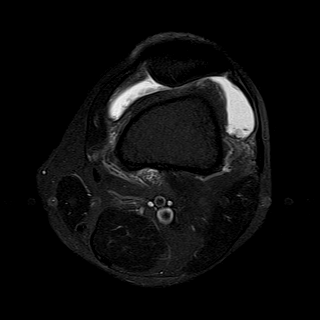
[im 31/31]
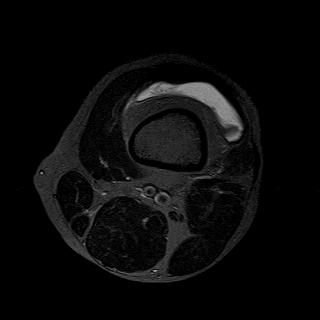

[Series 4: T1 · coronal · 4.0mm · 0.59mm/px · 6 of 26 slices shown]
[im 1/26]
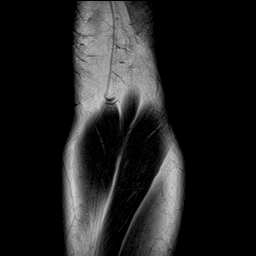
[im 6/26]
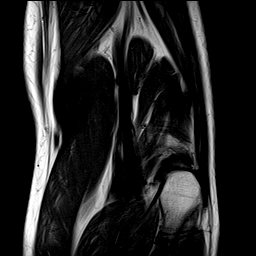
[im 11/26]
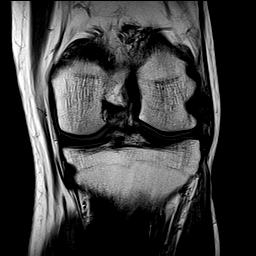
[im 16/26]
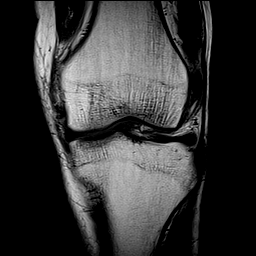
[im 21/26]
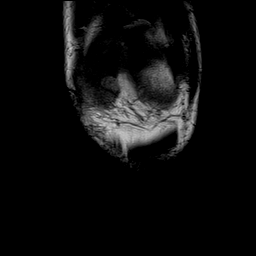
[im 26/26]
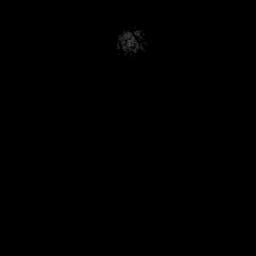

[Series 5: T2 fat-sat · coronal · 4.0mm · 0.59mm/px · 5 of 26 slices shown (2 of 3)]
[im 1/26]
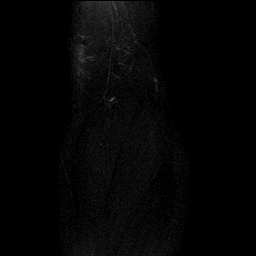
[im 7/26]
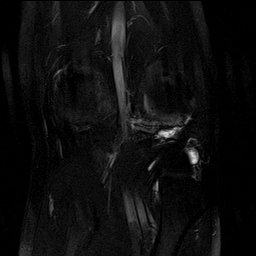
[im 13/26]
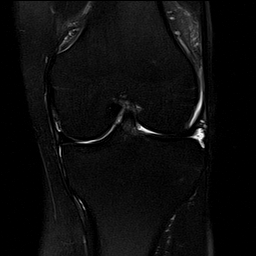
[im 19/26]
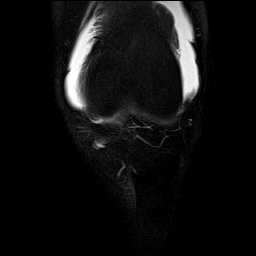
[im 26/26]
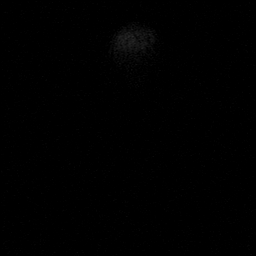

[Series 6: PD fat-sat · coronal · 4.0mm · 0.59mm/px · 5 of 26 slices shown (1 of 3)]
[im 1/26]
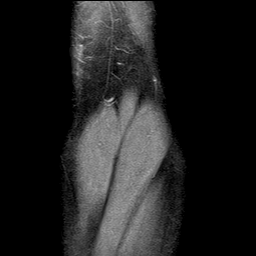
[im 7/26]
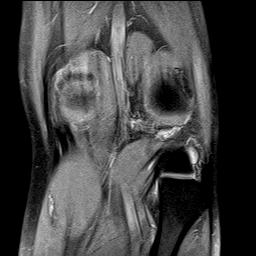
[im 13/26]
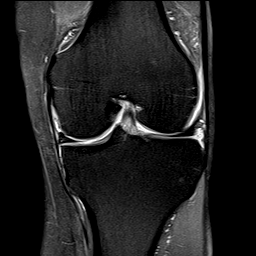
[im 19/26]
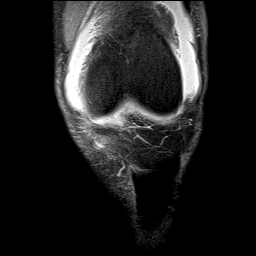
[im 26/26]
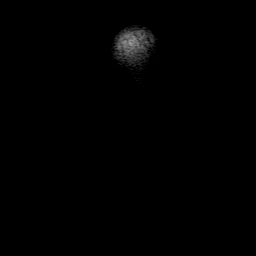

[Series 7: PD fat-sat · sagittal · 3.0mm · 0.59mm/px · 6 of 30 slices shown (2 of 3)]
[im 1/30]
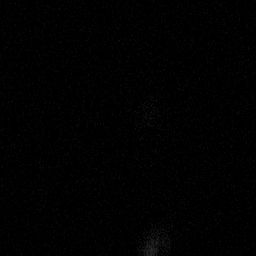
[im 6/30]
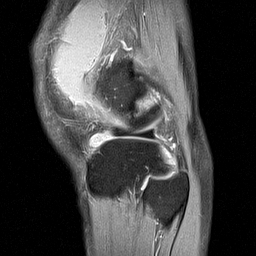
[im 12/30]
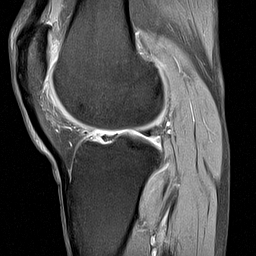
[im 18/30]
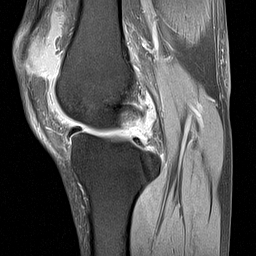
[im 24/30]
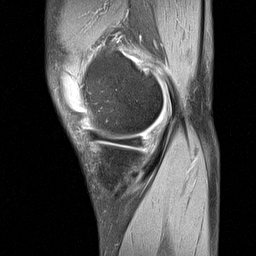
[im 30/30]
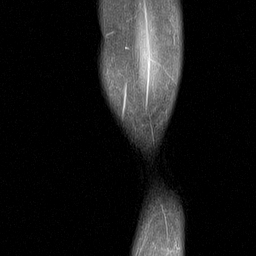

[Series 8: T2 fat-sat · sagittal · 3.0mm · 0.59mm/px · 6 of 30 slices shown (3 of 3)]
[im 1/30]
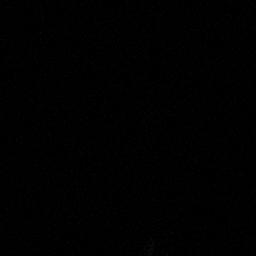
[im 6/30]
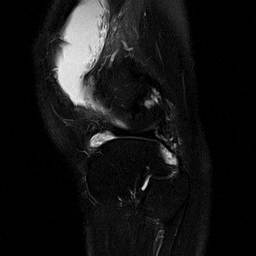
[im 12/30]
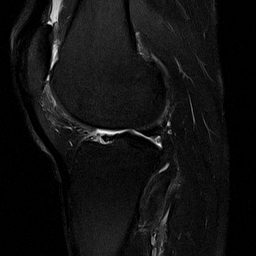
[im 18/30]
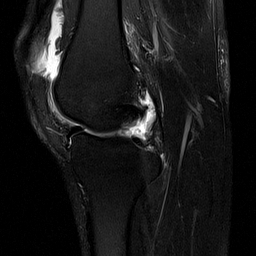
[im 24/30]
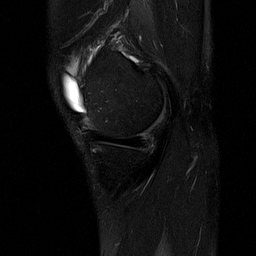
[im 30/30]
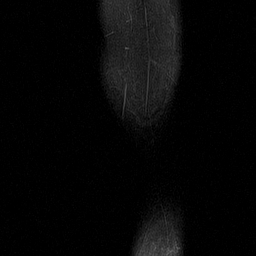

[Series 9: PD fat-sat · coronal · 2.0mm · 0.59mm/px · 4 of 21 slices shown (3 of 3)]
[im 1/21]
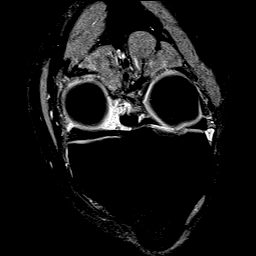
[im 7/21]
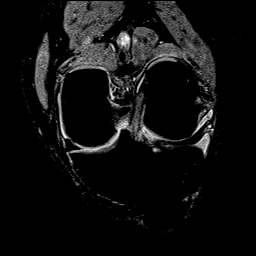
[im 14/21]
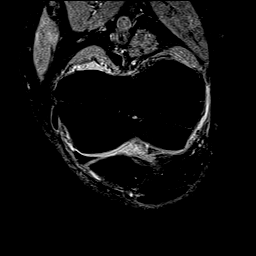
[im 21/21]
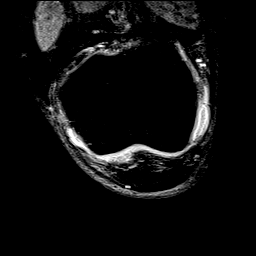

[40 of 40 positions shown; findings below may reference images not displayed]

FINDINGS: MENISCI

Medial meniscus:  Intact.

Lateral meniscus:  Intact.

LIGAMENTS

Cruciates: Intact ACL and PCL.

Collaterals: Intact MCL. Lateral collateral ligament complex intact.

CARTILAGE

Patellofemoral: Moderate cartilage loss within the superior aspect
of the central trochlear groove. Mild-moderate chondral thinning and
surface irregularity of the lateral patellar facet.

Medial:  No chondral defect.

Lateral:  No chondral defect.

MISCELLANEOUS

Joint: Moderate-sized joint effusion. Fat pads within normal limits.

Popliteal Fossa:  No Baker's cyst. Intact popliteus tendon.

Extensor Mechanism:  Intact quadriceps and patellar tendons.

Bones: No acute fracture. No dislocation. No bone marrow edema. No
marrow replacing bone lesion.

Other: No significant periarticular soft tissue findings.
IMPRESSION: 1. Mild-to-moderate patellofemoral compartment osteoarthritis.
2. Moderate-sized joint effusion.
3. Intact menisci. Intact cruciate and collateral ligaments.

## 2022-03-09 NOTE — Assessment & Plan Note (Signed)
Pleasant 51 year old male, knee osteoarthritis with recurrent effusions, ultimately we did Monovisc back in early February and symptoms completely resolved, he has a bit of effusion today so this was drained, he understands if this recurs he will need referral for arthroscopy, MRI showed intact menisci and cruciate ligaments, mostly cartilage fissuring and osteoarthritis. ?Return as needed. ?

## 2022-03-09 NOTE — Progress Notes (Signed)
? ? ?  Procedures performed today:   ? ?Procedure: Real-time Ultrasound Guided aspiration of left knee ?Device: Samsung HS60  ?Verbal informed consent obtained.  ?Time-out conducted.  ?Noted no overlying erythema, induration, or other signs of local infection.  ?Skin prepped in a sterile fashion.  ?Local anesthesia: Topical Ethyl chloride.  ?With sterile technique and under real time ultrasound guidance: Noted effusion, aspirated 20 mils of clear, straw-colored fluid. ?Completed without difficulty  ?Advised to call if fevers/chills, erythema, induration, drainage, or persistent bleeding.  ?Images permanently stored and available for review in PACS.  ?Impression: Technically successful ultrasound guided aspiration. ? ?Independent interpretation of notes and tests performed by another provider:  ? ?None. ? ?Brief History, Exam, Impression, and Recommendations:   ? ?Primary osteoarthritis of left knee ?Pleasant 51 year old male, knee osteoarthritis with recurrent effusions, ultimately we did Monovisc back in early February and symptoms completely resolved, he has a bit of effusion today so this was drained, he understands if this recurs he will need referral for arthroscopy, MRI showed intact menisci and cruciate ligaments, mostly cartilage fissuring and osteoarthritis. ?Return as needed. ? ? ? ?___________________________________________ ?Gwen Her. Dianah Field, M.D., ABFM., CAQSM. ?Primary Care and Sports Medicine ?[place] ? ?Adjunct Instructor of Family Medicine  ?[employer] ?

## 2022-03-13 ENCOUNTER — Encounter: Payer: Self-pay | Admitting: Sports Medicine

## 2022-03-13 DIAGNOSIS — M1712 Unilateral primary osteoarthritis, left knee: Secondary | ICD-10-CM

## 2022-03-15 ENCOUNTER — Encounter: Payer: Self-pay | Admitting: Family Medicine

## 2022-03-15 MED ORDER — TRIAMCINOLONE ACETONIDE 0.1 % EX CREA: 1.0000 "application " | TOPICAL_CREAM | Freq: Two times a day (BID) | CUTANEOUS | 0 refills | Status: DC

## 2022-03-15 NOTE — Telephone Encounter (Signed)
Nonemergent MyChart message about his lips being chapped, routing to PCP.  ?

## 2022-03-15 NOTE — Telephone Encounter (Signed)
Meds ordered this encounter  ?Medications  ? triamcinolone cream (KENALOG) 0.1 %  ?  Sig: Apply 1 application. topically 2 (two) times daily. Don't use for more than 10 days in a row  ?  Dispense:  30 g  ?  Refill:  0  ? ? ?

## 2022-04-02 ENCOUNTER — Encounter: Payer: Self-pay | Admitting: Family Medicine

## 2022-04-04 MED ORDER — GABAPENTIN 100 MG PO CAPS: 100.0000 mg | ORAL_CAPSULE | Freq: Three times a day (TID) | ORAL | 3 refills | Status: DC

## 2022-04-17 NOTE — Progress Notes (Signed)
Sounds like he needs appt with T.  Lets see if he can come in in the next couple of days. ?

## 2022-05-01 ENCOUNTER — Encounter: Payer: Self-pay | Admitting: Family Medicine

## 2022-05-24 ENCOUNTER — Ambulatory Visit: Payer: 59 | Admitting: Family Medicine

## 2022-05-24 ENCOUNTER — Encounter: Payer: Self-pay | Admitting: Family Medicine

## 2022-05-24 VITALS — BP 132/87 | HR 78 | Ht 67.0 in | Wt 151.0 lb

## 2022-05-24 DIAGNOSIS — M5412 Radiculopathy, cervical region: Secondary | ICD-10-CM | POA: Diagnosis not present

## 2022-05-24 DIAGNOSIS — R490 Dysphonia: Secondary | ICD-10-CM

## 2022-05-24 DIAGNOSIS — G4733 Obstructive sleep apnea (adult) (pediatric): Secondary | ICD-10-CM

## 2022-05-24 DIAGNOSIS — I10 Essential (primary) hypertension: Secondary | ICD-10-CM | POA: Diagnosis not present

## 2022-05-24 MED ORDER — AMBULATORY NON FORMULARY MEDICATION: 0 refills | Status: DC

## 2022-05-24 MED ORDER — PREDNISONE 20 MG PO TABS: ORAL_TABLET | ORAL | 0 refills | Status: AC

## 2022-05-24 MED ORDER — LISINOPRIL 20 MG PO TABS: 20.0000 mg | ORAL_TABLET | Freq: Every day | ORAL | 3 refills | Status: DC

## 2022-05-24 NOTE — Progress Notes (Signed)
Pt reports that since he received his Shingrix vaccine 02/28/2022 he has been experiencing some L arm numbness. He states that it will feel like shooting down his arm.

## 2022-05-24 NOTE — Progress Notes (Signed)
e  Acute Office Visit  Subjective:     Patient ID: Gabriel Juarez, male    DOB: 1971-01-20, 51 y.o.   MRN: [id number]  Chief Complaint  Patient presents with   arm numbness    HPI Patient is in today for Pain going from the left side of his neck down to the antecubital fossa.  He says it started soon after he received his shingles vaccine in March.  He was not sure if it was related.  He says he notices its very specifically when he lays on his stomach at night and he has his head rotated to the left he says he will get a sudden electrical shock sensation from the side of his neck down to the antecubital fossa sometimes even a little bit past that.  It never goes all the way down to the hand.  No numbness tingling or weakness in the hand.  No recent trauma.  He does have a history of cervical disc issues.  In fact about a year and a half ago in January 2022 he actually received a couple of epidural injections into his neck.  He had an MRI done in January 2022.  At that time it showed multilevel cervical spondylosis most at C5-6 where there was some mild canal stenosis and moderate bilateral foraminal stenosis.  Also some moderate left foraminal stenosis at C3-4 and C4-5.  He says he has noticed that the last couple of times he has been seen his blood pressures have been elevated so he is okay with restarting his blood pressure medication.  ROS      Objective:    BP 132/87   Pulse 78   Ht 5\' 7"  (1.702 m)   Wt 151 lb (68.5 kg)   SpO2 99%   BMI 23.65 kg/m    Physical Exam Vitals reviewed.  Constitutional:      Appearance: He is well-developed.  HENT:     Head: Normocephalic and atraumatic.  Eyes:     Conjunctiva/sclera: Conjunctivae normal.  Cardiovascular:     Rate and Rhythm: Normal rate.  Pulmonary:     Effort: Pulmonary effort is normal.  Musculoskeletal:     Comments: Normal cervical flexion and extension rotation right and left.  Normal sidebending right and left.   Positive Spurling's to the left.  Left shoulder with normal range of motion and strength.  Negative empty can test.  Nontender over the cervical spine or left shoulder joint.  Skin:    General: Skin is dry.     Coloration: Skin is not pale.  Neurological:     Mental Status: He is alert and oriented to person, place, and time.  Psychiatric:        Behavior: Behavior normal.     No results found for any visits on 05/24/22.      Assessment & Plan:   Problem List Items Addressed This Visit       Cardiovascular and Mediastinum   Essential hypertension, benign    Blood pressure has been borderline elevated the last couple times he has been here.  We will go ahead and restart lisinopril 20 mg.  New prescription sent to pharmacy.  Labs are up-to-date.      Relevant Medications   lisinopril (ZESTRIL) 20 MG tablet     Respiratory   OSA (obstructive sleep apnea)   Relevant Medications   AMBULATORY NON FORMULARY MEDICATION   Other Visit Diagnoses     Left cervical radiculopathy    -  Primary   Relevant Orders   DG Cervical Spine Complete   Voice hoarseness       Relevant Orders   Ambulatory referral to ENT      Left cervical radiculopathy-reassured him that I do not think this is related to the shingles vaccine it sounds like his pain is coming directly from his cervical spine and is triggered distinctly by position change.  We discussed a couple of options including a trial of prednisone to reduce inflammation around the nerve.  And possibly even an x-ray if not improving in the next couple of weeks.  If still having persistent pain at that point consider getting him back in with Dr. Darene Lamer to see if he may need another round of injections.  It looks like based on previous MRI results that he had more significant foraminal stenosis on that left side at C3-4 and C4-5.  Voice hoarseness-please see prior MyChart note-he says he never received a call from ENT.  We will place new referral  today.  Meds ordered this encounter  Medications   predniSONE (DELTASONE) 20 MG tablet    Sig: Take 2 tablets (40 mg total) by mouth daily with breakfast for 4 days, THEN 1 tablet (20 mg total) daily with breakfast for 4 days.    Dispense:  12 tablet    Refill:  0   lisinopril (ZESTRIL) 20 MG tablet    Sig: Take 1 tablet (20 mg total) by mouth daily.    Dispense:  90 tablet    Refill:  3   AMBULATORY NON FORMULARY MEDICATION    Sig: Medication Name: CPAP set to 5-15 cm water pressure.  AHI was 13.1.    Dispense:  1 Units    Refill:  0    No follow-ups on file.  Beatrice Lecher, MD

## 2022-05-24 NOTE — Assessment & Plan Note (Signed)
Blood pressure has been borderline elevated the last couple times he has been here.  We will go ahead and restart lisinopril 20 mg.  New prescription sent to pharmacy.  Labs are up-to-date.

## 2022-05-28 ENCOUNTER — Ambulatory Visit: Payer: 59 | Admitting: Family Medicine

## 2022-06-11 ENCOUNTER — Telehealth (HOSPITAL_COMMUNITY): Payer: Self-pay | Admitting: Licensed Clinical Social Worker

## 2022-06-15 ENCOUNTER — Ambulatory Visit: Payer: 59

## 2022-06-18 ENCOUNTER — Ambulatory Visit (INDEPENDENT_AMBULATORY_CARE_PROVIDER_SITE_OTHER): Payer: BC Managed Care – PPO | Admitting: Family Medicine

## 2022-06-18 ENCOUNTER — Ambulatory Visit (INDEPENDENT_AMBULATORY_CARE_PROVIDER_SITE_OTHER): Payer: BC Managed Care – PPO

## 2022-06-18 VITALS — Temp 98.0°F

## 2022-06-18 DIAGNOSIS — Z23 Encounter for immunization: Secondary | ICD-10-CM

## 2022-06-18 DIAGNOSIS — M5412 Radiculopathy, cervical region: Secondary | ICD-10-CM

## 2022-06-18 NOTE — Progress Notes (Signed)
Hi Gabriel Juarez, you have moderate arthritis and degeneration at C4, C5, C6 and C7.  It looks like it is worse than it was back in 2022.  Your pain is definitely most likely coming from your neck.  Hopefully the prednisone was helpful.  We could either get you in with Dr. Karie Schwalbe, he is currently out of town but will be back.  Or we can get you in with an orthopedist.  Let me know what you prefer.

## 2022-06-18 NOTE — Progress Notes (Signed)
Agree with documentation as above.   Luva Metzger, MD  

## 2022-06-21 ENCOUNTER — Encounter: Payer: Self-pay | Admitting: Family Medicine

## 2022-06-21 NOTE — Telephone Encounter (Signed)
LVM for patient to call back to get appt with Dr T scheduled. AMUCK 

## 2022-06-28 ENCOUNTER — Ambulatory Visit: Payer: BC Managed Care – PPO | Admitting: Sports Medicine

## 2022-06-28 ENCOUNTER — Ambulatory Visit (INDEPENDENT_AMBULATORY_CARE_PROVIDER_SITE_OTHER): Payer: BC Managed Care – PPO

## 2022-06-28 DIAGNOSIS — M5030 Other cervical disc degeneration, unspecified cervical region: Secondary | ICD-10-CM | POA: Diagnosis not present

## 2022-06-28 DIAGNOSIS — M1712 Unilateral primary osteoarthritis, left knee: Secondary | ICD-10-CM | POA: Diagnosis not present

## 2022-06-28 MED ORDER — PREGABALIN 50 MG PO CAPS: ORAL_CAPSULE | ORAL | 3 refills | Status: DC

## 2022-06-28 NOTE — Progress Notes (Signed)
    Procedures performed today:    Procedure: Real-time Ultrasound Guided aspiration/injection of left Device: Samsung HS60  Verbal informed consent obtained.  Time-out conducted.  Noted no overlying erythema, induration, or other signs of local infection.  Skin prepped in a sterile fashion.  Local anesthesia: Topical Ethyl chloride.  With sterile technique and under real time ultrasound guidance: Advanced an 18-gauge needle into a suprapatellar recess, aspirated approximately 20 mL of clear, straw-colored fluid, syringe switched and 1 cc Kenalog 40, 2 cc lidocaine, 2 cc bupivacaine injected easily Completed without difficulty  Advised to call if fevers/chills, erythema, induration, drainage, or persistent bleeding.  Images permanently stored and available for review in PACS.  Impression: Technically successful ultrasound guided aspiration/injection.  Independent interpretation of notes and tests performed by another provider:   None.  Brief History, Exam, Impression, and Recommendations:    DDD (degenerative disc disease), cervical Pleasant 51 year old male, known cervical DDD, last couple of epidurals were in the spring of last year. Not looking for any more epidurals, we will start Lyrica, continue home conditioning.   Primary osteoarthritis of left knee Did fairly well initially after Monovisc, had recurrent effusions, has had arthroscopy. Now with a slight recurrence of effusion. Aspiration and injection today. Return as needed for this.    ____________________________________________ Gabriel Juarez. Gabriel Juarez, M.D., ABFM., CAQSM., AME. Primary Care and Sports Medicine [facility]  Adjunct Professor of Family Medicine  [place] [employer]  Restaurant manager, fast food

## 2022-06-28 NOTE — Assessment & Plan Note (Signed)
Pleasant 50 year old male, known cervical DDD, last couple of epidurals were in the spring of last year. Not looking for any more epidurals, we will start Lyrica, continue home conditioning.

## 2022-06-28 NOTE — Assessment & Plan Note (Signed)
Did fairly well initially after Monovisc, had recurrent effusions, has had arthroscopy. Now with a slight recurrence of effusion. Aspiration and injection today. Return as needed for this.

## 2022-07-02 ENCOUNTER — Ambulatory Visit: Payer: BC Managed Care – PPO | Admitting: Sports Medicine

## 2022-08-05 ENCOUNTER — Encounter: Payer: Self-pay | Admitting: Family Medicine

## 2022-08-06 NOTE — Telephone Encounter (Signed)
Please check with the company.  It was entered as a nonambulatory prescription and faxed.  It was sent on June 8.

## 2022-08-09 ENCOUNTER — Ambulatory Visit (INDEPENDENT_AMBULATORY_CARE_PROVIDER_SITE_OTHER): Payer: BC Managed Care – PPO | Admitting: Sports Medicine

## 2022-08-09 ENCOUNTER — Encounter: Payer: Self-pay | Admitting: Sports Medicine

## 2022-08-09 DIAGNOSIS — M5030 Other cervical disc degeneration, unspecified cervical region: Secondary | ICD-10-CM

## 2022-08-09 MED ORDER — PREGABALIN 75 MG PO CAPS: 75.0000 mg | ORAL_CAPSULE | Freq: Three times a day (TID) | ORAL | 0 refills | Status: DC

## 2022-08-09 NOTE — Progress Notes (Signed)
    Procedures performed today:    None.  Independent interpretation of notes and tests performed by another provider:   None.  Brief History, Exam, Impression, and Recommendations:    DDD (degenerative disc disease), cervical Gabriel Juarez 51 year old male, cervical DDD, last couple of epidurals were in the spring of last year, we started Lyrica at the last visit, going up to 75 3 times daily, will do 2 weeks at a time and he can MyChart me for dose titrations.  Process not at goal with pharmacologic intervention  ____________________________________________ [name]. Benjamin Stain, M.D., ABFM., CAQSM., AME. Primary Care and Sports Medicine [place] [facility]  Adjunct Professor of Family Medicine  [place] [employer]  Restaurant manager, fast food

## 2022-08-09 NOTE — Assessment & Plan Note (Signed)
Pleasant 51 year old male, cervical DDD, last couple of epidurals were in the spring of last year, we started Lyrica at the last visit, going up to 75 3 times daily, will do 2 weeks at a time and he can MyChart me for dose titrations.

## 2022-08-13 ENCOUNTER — Encounter: Payer: Self-pay | Admitting: Sports Medicine

## 2022-08-30 ENCOUNTER — Encounter: Payer: Self-pay | Admitting: Family Medicine

## 2022-08-30 ENCOUNTER — Ambulatory Visit: Payer: BC Managed Care – PPO | Admitting: Family Medicine

## 2022-08-30 VITALS — BP 117/60 | HR 79 | Wt 149.0 lb

## 2022-08-30 DIAGNOSIS — R200 Anesthesia of skin: Secondary | ICD-10-CM | POA: Diagnosis not present

## 2022-08-30 DIAGNOSIS — R202 Paresthesia of skin: Secondary | ICD-10-CM

## 2022-08-30 MED ORDER — CELECOXIB 200 MG PO CAPS: 200.0000 mg | ORAL_CAPSULE | Freq: Two times a day (BID) | ORAL | 0 refills | Status: DC | PRN

## 2022-08-30 NOTE — Progress Notes (Unsigned)
Acute Office Visit  Subjective:     Patient ID: Gabriel Juarez, male    DOB: Jun 04, 1971, 51 y.o.   MRN: [id number]  Chief Complaint  Patient presents with   Numbness    Both hands and right foot.    HPI Patient is in today for numbness in hands and right foot.  He reports that he has had numbness in between his thumb and first finger on both hands for about 77-month sometimes it actually goes into the thumb and first finger.  He does notice sometimes at night waking up and feeling like his hands are numb.  He denies any known trauma or injury no specific triggers nothing that makes it worse.  He has also had some numbness in his toes on his right foot for a couple of weeks.  No injury or trauma.  He wears good supportive shoes he does not go barefoot.  He says he notices it mostly at night when he lays down and that usually better by the morning.  He has been out of his Celebrex.  He is on pregabalin.  ROS      Objective:    BP 117/60   Pulse 79   Wt 149 lb (67.6 kg)   SpO2 99%   BMI 23.34 kg/m  {Vitals History (Optional):23777}  Physical Exam Vitals reviewed.  Constitutional:      Appearance: He is well-developed.  HENT:     Head: Normocephalic and atraumatic.  Eyes:     Conjunctiva/sclera: Conjunctivae normal.  Cardiovascular:     Rate and Rhythm: Normal rate.  Pulmonary:     Effort: Pulmonary effort is normal.  Musculoskeletal:     Comments: With wrists and hands with normal appearance.  No swelling or rash or joint effusions.  Radial pulse 2+ bilaterally.  Tennille's negative on the right but some increase in discomfort on the left.  Positive Phalen's on the left  Skin:    General: Skin is dry.     Coloration: Skin is not pale.  Neurological:     Mental Status: He is alert and oriented to person, place, and time.  Psychiatric:        Behavior: Behavior normal.     No results found for any visits on 08/30/22.      Assessment & Plan:   Problem List  Items Addressed This Visit   None Visit Diagnoses     Numbness and tingling in both hands    -  Primary   Relevant Orders   TSH   B12   Vitamin B1   Vitamin B6   Fe+TIBC+Fer   Numbness of right foot       Relevant Orders   TSH   B12   Vitamin B1   Vitamin B6   Fe+TIBC+Fer       Bilateral hand numbness most consistent with carpal tunnel syndrome.  Given additional handout and information.  We will go ahead and refill his Celebrex.  Also given bilateral cock-up splints to wear just at night.  If he is not improving over the next 3 weeks then please let me know and will refer back to sports med for further work-up and evaluation.  Since he is also having some numbness in his right foot that started recently I would like to also evaluate for deficiencies that could be contributing to her neuropathy.  Meds ordered this encounter  Medications   celecoxib (CELEBREX) 200 MG capsule    Sig: Take 1  capsule (200 mg total) by mouth 2 (two) times daily as needed.    Dispense:  180 capsule    Refill:  0    No follow-ups on file.  Nani Gasser, MD

## 2022-08-30 NOTE — Patient Instructions (Signed)
Let us know if you are not feeling better after 2 to 3 weeks of wearing the night splints.  Again just looking for improvement it can sometimes take several months for complete resolution of symptoms.

## 2022-08-31 ENCOUNTER — Encounter: Payer: Self-pay | Admitting: Family Medicine

## 2022-08-31 DIAGNOSIS — E538 Deficiency of other specified B group vitamins: Secondary | ICD-10-CM | POA: Insufficient documentation

## 2022-08-31 NOTE — Progress Notes (Signed)
Giannis, thyroid looks great, vitamin B12 on the lower end of normal but it still normal.  Encouraged her to consider starting an over-the-counter B12 supplement and then lets plan to recheck your level in 3 to 4 months to see if it is improving.  Iron stores look great.  Vitamin B1 and vitamin B6 are still pending.

## 2022-09-02 ENCOUNTER — Other Ambulatory Visit: Payer: Self-pay | Admitting: Family Medicine

## 2022-09-05 ENCOUNTER — Other Ambulatory Visit: Payer: Self-pay | Admitting: *Deleted

## 2022-09-05 DIAGNOSIS — R899 Unspecified abnormal finding in specimens from other organs, systems and tissues: Secondary | ICD-10-CM

## 2022-09-05 LAB — VITAMIN B1: Vitamin B1 (Thiamine): 10 nmol/L (ref 8–30)

## 2022-09-05 LAB — TSH: TSH: 0.54 mIU/L (ref 0.40–4.50)

## 2022-09-05 LAB — IRON,TIBC AND FERRITIN PANEL
%SAT: 34 % (calc) (ref 20–48)
Ferritin: 154 ng/mL (ref 38–380)
Iron: 114 ug/dL (ref 50–180)
TIBC: 337 mcg/dL (calc) (ref 250–425)

## 2022-09-05 LAB — VITAMIN B6: Vitamin B6: 17.7 ng/mL (ref 2.1–21.7)

## 2022-09-05 LAB — VITAMIN B12: Vitamin B-12: 273 pg/mL (ref 200–1100)

## 2022-09-05 NOTE — Progress Notes (Signed)
Hi Gabriel Juarez, your B1 is also a little borderline low.  It still technically in the normal range but it is on the low end.  So please go ahead and take a B1 with your B12 and we can recheck both of those levels in 3 to 4 months.     If the numbness and tingling does not improve over the next month then please let us know.

## 2022-09-18 ENCOUNTER — Encounter: Payer: Self-pay | Admitting: Sports Medicine

## 2022-10-11 ENCOUNTER — Encounter (INDEPENDENT_AMBULATORY_CARE_PROVIDER_SITE_OTHER): Payer: BC Managed Care – PPO | Admitting: Sports Medicine

## 2022-10-11 DIAGNOSIS — M5030 Other cervical disc degeneration, unspecified cervical region: Secondary | ICD-10-CM | POA: Diagnosis not present

## 2022-10-11 MED ORDER — PREGABALIN 100 MG PO CAPS: 100.0000 mg | ORAL_CAPSULE | Freq: Three times a day (TID) | ORAL | 3 refills | Status: DC

## 2022-10-11 NOTE — Telephone Encounter (Signed)
I spent 5 total minutes of online digital evaluation and management services in this patient-initiated request for online care. 

## 2022-11-21 ENCOUNTER — Encounter (INDEPENDENT_AMBULATORY_CARE_PROVIDER_SITE_OTHER): Payer: BC Managed Care – PPO | Admitting: Sports Medicine

## 2022-11-21 DIAGNOSIS — M5030 Other cervical disc degeneration, unspecified cervical region: Secondary | ICD-10-CM | POA: Diagnosis not present

## 2022-11-21 MED ORDER — PREDNISONE 50 MG PO TABS: ORAL_TABLET | ORAL | 0 refills | Status: DC

## 2022-11-21 NOTE — Telephone Encounter (Signed)

## 2022-12-31 ENCOUNTER — Encounter: Payer: Self-pay | Admitting: Sports Medicine

## 2023-01-10 ENCOUNTER — Ambulatory Visit: Payer: BC Managed Care – PPO | Admitting: Sports Medicine

## 2023-01-10 ENCOUNTER — Encounter: Payer: Self-pay | Admitting: Sports Medicine

## 2023-01-10 DIAGNOSIS — M5030 Other cervical disc degeneration, unspecified cervical region: Secondary | ICD-10-CM | POA: Diagnosis not present

## 2023-01-10 MED ORDER — CELECOXIB 200 MG PO CAPS: 200.0000 mg | ORAL_CAPSULE | Freq: Two times a day (BID) | ORAL | 0 refills | Status: DC | PRN

## 2023-01-10 MED ORDER — PREGABALIN 150 MG PO CAPS: 150.0000 mg | ORAL_CAPSULE | Freq: Three times a day (TID) | ORAL | 5 refills | Status: DC

## 2023-01-10 NOTE — Progress Notes (Signed)
    Procedures performed today:    None.  Independent interpretation of notes and tests performed by another provider:   None.  Brief History, Exam, Impression, and Recommendations:    DDD (degenerative disc disease), cervical This is a very pleasant 52 year old male, known cervical DDD, he has not had an epidural since 2022 sometime. We have been doing a slow up titration of Lyrica and landed on 100 mg 3 times daily, this worked for a while but he is now noticing waning efficacy. He is not currently taking an anti-inflammatory so I will refill his Celebrex, I will increase his Lyrica to 150 mg 3 times daily. Additional home physical therapy given. If all of this fails over the next 4 to 6 weeks we will consider repeat epidural.  Chronic process with exacerbation and pharmacologic intervention  ____________________________________________ Gwen Her. Dianah Field, M.D., ABFM., CAQSM., AME. Primary Care and Sports Medicine [facility]  Adjunct Professor of [place] of [facility] of Medicine  Risk manager

## 2023-01-10 NOTE — Assessment & Plan Note (Signed)
This is a very pleasant 52 year old male, known cervical DDD, he has not had an epidural since 2022 sometime. We have been doing a slow up titration of Lyrica and landed on 100 mg 3 times daily, this worked for a while but he is now noticing waning efficacy. He is not currently taking an anti-inflammatory so I will refill his Celebrex, I will increase his Lyrica to 150 mg 3 times daily. Additional home physical therapy given. If all of this fails over the next 4 to 6 weeks we will consider repeat epidural.

## 2023-01-23 ENCOUNTER — Encounter: Payer: Self-pay | Admitting: Family Medicine

## 2023-01-24 ENCOUNTER — Ambulatory Visit: Payer: BC Managed Care – PPO | Admitting: Family Medicine

## 2023-01-24 ENCOUNTER — Encounter: Payer: Self-pay | Admitting: Family Medicine

## 2023-01-24 VITALS — BP 119/51 | HR 82 | Ht 67.0 in | Wt 169.0 lb

## 2023-01-24 DIAGNOSIS — K625 Hemorrhage of anus and rectum: Secondary | ICD-10-CM | POA: Diagnosis not present

## 2023-01-24 DIAGNOSIS — R21 Rash and other nonspecific skin eruption: Secondary | ICD-10-CM

## 2023-01-24 NOTE — Patient Instructions (Signed)
Please start a stool softener twice a day Work on increasing fiber in your diet.  The biggest source of fiber is vegetables. Also make sure you are drinking plenty of water daily this helps keep the stool hydrated and soft as well. The bleeding is not better in 2 weeks then please let us know.

## 2023-01-24 NOTE — Addendum Note (Signed)
Addended by: Teddy Spike on: 01/24/2023 11:48 AM   Modules accepted: Orders

## 2023-01-24 NOTE — Progress Notes (Signed)
   Acute Office Visit  Subjective:     Patient ID: Gabriel Juarez, male    DOB: [birth year], 52 y.o.   MRN: [id number]  Chief Complaint  Patient presents with   Blood In Stools    Bright red denies being constipated. Started about 1.5 weeks ago most recently 2 days ago.    Rash    X 1 week at antecubital area he has been using cortisone cream.      HPI Patient is in today for an stool and rash on inner arm, antecubital area. Mildly itching.  Present x 6 months.  It was more itchy when it first arose in fact he thought it was poison ivy but it never went away.  Now just occasionally itchy and he will sometimes use an over-the-counter hydrocortisone cream on it.  He also reports that about 2 weeks ago he noticed a little bit of blood after a bowel movement.  He does have a known internal hemorrhoid.  History of colonoscopy in May 2022.  He has not had any pain or discomfort in the rectal area or in the abdomen.      ROS      Objective:    BP (!) 119/51   Pulse 82   Wt 169 lb (76.7 kg)   SpO2 99%   BMI 26.47 kg/m    Physical Exam Constitutional:      Appearance: He is well-developed.  HENT:     Head: Normocephalic and atraumatic.  Pulmonary:     Effort: Pulmonary effort is normal.  Skin:    General: Skin is warm and dry.     Comments: The left antecubital fossa he has a dry hyperpigmented patch of skin.  No vesicles.  Neurological:     Mental Status: He is alert and oriented to person, place, and time.  Psychiatric:        Behavior: Behavior normal.     No results found for any visits on 01/24/23.      Assessment & Plan:   Problem List Items Addressed This Visit   None Visit Diagnoses     Rectal bleeding    -  Primary   Rash          Rectal bleeding-discussed doing an external exam today and possibly internal.  We also discussed working on softening his stools which have been a little bit more firm lately.  He wants to try those changes first and if not  improving in a couple of weeks then come back for an exam.  Please start a stool softener twice a day Work on increasing fiber in your diet.  The biggest source of fiber is vegetables. Also make sure you are drinking plenty of water daily this helps keep the stool hydrated and soft as well. The bleeding is not better in 2 weeks then please let us know.   Rash-skin scraping performed will call with results once available.  Will end up likely treating with a topical steroid if the scraping is negative for fungal elements.  No orders of the defined types were placed in this encounter.   No follow-ups on file.  Beatrice Lecher, MD

## 2023-01-28 LAB — FUNGAL STAIN
FUNGAL SMEAR:: NONE SEEN
MICRO NUMBER:: 14538946
SPECIMEN QUALITY:: ADEQUATE

## 2023-01-29 ENCOUNTER — Encounter: Payer: Self-pay | Admitting: Family Medicine

## 2023-01-29 DIAGNOSIS — G4733 Obstructive sleep apnea (adult) (pediatric): Secondary | ICD-10-CM

## 2023-01-29 MED ORDER — TRIAMCINOLONE ACETONIDE 0.5 % EX OINT: 1.0000 | TOPICAL_OINTMENT | Freq: Two times a day (BID) | CUTANEOUS | 0 refills | Status: DC

## 2023-01-29 NOTE — Addendum Note (Signed)
Addended by: Beatrice Lecher D on: 01/29/2023 07:48 AM   Modules accepted: Orders

## 2023-01-29 NOTE — Progress Notes (Signed)
Hi Saba, skin scraping was negative for any fungal elements.  Send over topical steroid cream that stronger than the over-the-counter.  If it is not clearing up over the next couple of weeks please let us know.

## 2023-02-05 MED ORDER — AMBULATORY NON FORMULARY MEDICATION: 0 refills | Status: DC

## 2023-02-21 ENCOUNTER — Ambulatory Visit: Payer: BC Managed Care – PPO | Admitting: Sports Medicine

## 2023-02-21 DIAGNOSIS — M5030 Other cervical disc degeneration, unspecified cervical region: Secondary | ICD-10-CM | POA: Diagnosis not present

## 2023-02-21 MED ORDER — DICLOFENAC SODIUM 75 MG PO TBEC: 75.0000 mg | DELAYED_RELEASE_TABLET | Freq: Two times a day (BID) | ORAL | 3 refills | Status: DC

## 2023-02-21 MED ORDER — PREGABALIN 200 MG PO CAPS: 200.0000 mg | ORAL_CAPSULE | Freq: Three times a day (TID) | ORAL | 3 refills | Status: DC

## 2023-02-21 NOTE — Progress Notes (Signed)
    Procedures performed today:    None.  Independent interpretation of notes and tests performed by another provider:   None.  Brief History, Exam, Impression, and Recommendations:    DDD (degenerative disc disease), cervical This is a very pleasant 52 year old male, known cervical DDD, he had an epidural back in 2022, we have been trying to do a slow up titration of Lyrica, at the last visit we bumped up to 150 mg 3 times daily and added Celebrex. Home physical therapy given. Unfortunately he has not noted improved symptoms, we will give Lyrica the due diligence of titration to the maximum, increasing to 200 mg 3 times daily, switching from Celebrex to Voltaren. If he does not have sufficient improvement after about 4 weeks we will proceed with another epidural.    ____________________________________________ Gwen Her. Dianah Field, M.D., ABFM., CAQSM., AME. Primary Care and Sports Medicine [facility]  Adjunct Professor of [place] of [facility] of Medicine  Risk manager

## 2023-02-21 NOTE — Assessment & Plan Note (Signed)
This is a very pleasant 52 year old male, known cervical DDD, he had an epidural back in 2022, we have been trying to do a slow up titration of Lyrica, at the last visit we bumped up to 150 mg 3 times daily and added Celebrex. Home physical therapy given. Unfortunately he has not noted improved symptoms, we will give Lyrica the due diligence of titration to the maximum, increasing to 200 mg 3 times daily, switching from Celebrex to Voltaren. If he does not have sufficient improvement after about 4 weeks we will proceed with another epidural.

## 2023-03-21 ENCOUNTER — Ambulatory Visit: Payer: BC Managed Care – PPO | Admitting: Sports Medicine

## 2023-03-21 ENCOUNTER — Encounter: Payer: Self-pay | Admitting: Sports Medicine

## 2023-03-21 DIAGNOSIS — M5030 Other cervical disc degeneration, unspecified cervical region: Secondary | ICD-10-CM | POA: Diagnosis not present

## 2023-03-21 MED ORDER — DICLOFENAC SODIUM 75 MG PO TBEC: 75.0000 mg | DELAYED_RELEASE_TABLET | Freq: Two times a day (BID) | ORAL | 3 refills | Status: AC

## 2023-03-21 MED ORDER — PREGABALIN 200 MG PO CAPS: 200.0000 mg | ORAL_CAPSULE | Freq: Three times a day (TID) | ORAL | 3 refills | Status: DC

## 2023-03-21 NOTE — Progress Notes (Signed)
    Procedures performed today:    None.  Independent interpretation of notes and tests performed by another provider:   None.  Brief History, Exam, Impression, and Recommendations:    DDD (degenerative disc disease), cervical Gabriel Juarez returns, he is a pleasant 52 year old male, known cervical DDD with negative nerve conduction studies, no neuropathy, no radiculopathy.  He had an epidural back in 2022, he also has been going up slowly on Lyrica. Celebrex. At the last visit we added some additional home physical therapy and increase Lyrica to 200 mg 3 times daily and switched Celebrex to Voltaren, he notes good relief in his symptoms, we will continue the above, he can just call me or MyChart me for an epidural. Return as needed.    ____________________________________________ Gwen Her. Dianah Field, M.D., ABFM., CAQSM., AME. Primary Care and Sports Medicine [facility]  Adjunct Professor of [place] of [facility] of Medicine  Risk manager

## 2023-03-21 NOTE — Assessment & Plan Note (Signed)
Gabriel Juarez returns, he is a pleasant 52 year old male, known cervical DDD with negative nerve conduction studies, no neuropathy, no radiculopathy.  He had an epidural back in 2022, he also has been going up slowly on Lyrica. Celebrex. At the last visit we added some additional home physical therapy and increase Lyrica to 200 mg 3 times daily and switched Celebrex to Voltaren, he notes good relief in his symptoms, we will continue the above, he can just call me or MyChart me for an epidural. Return as needed.

## 2023-04-02 ENCOUNTER — Encounter: Payer: Self-pay | Admitting: Family Medicine

## 2023-04-02 DIAGNOSIS — K625 Hemorrhage of anus and rectum: Secondary | ICD-10-CM

## 2023-04-03 NOTE — Telephone Encounter (Signed)
Ol to refer to GI. - mark as urgent

## 2023-04-09 ENCOUNTER — Other Ambulatory Visit: Payer: Self-pay | Admitting: Sports Medicine

## 2023-04-09 DIAGNOSIS — M5030 Other cervical disc degeneration, unspecified cervical region: Secondary | ICD-10-CM

## 2023-04-09 DIAGNOSIS — K921 Melena: Secondary | ICD-10-CM | POA: Diagnosis not present

## 2023-04-19 ENCOUNTER — Encounter: Payer: Self-pay | Admitting: Family Medicine

## 2023-04-22 MED ORDER — PANTOPRAZOLE SODIUM 40 MG PO TBEC: 40.0000 mg | DELAYED_RELEASE_TABLET | Freq: Every day | ORAL | 1 refills | Status: DC

## 2023-05-31 ENCOUNTER — Encounter: Payer: Self-pay | Admitting: Sports Medicine

## 2023-05-31 ENCOUNTER — Ambulatory Visit: Payer: BC Managed Care – PPO | Admitting: Sports Medicine

## 2023-05-31 VITALS — BP 124/84 | HR 84

## 2023-05-31 DIAGNOSIS — J310 Chronic rhinitis: Secondary | ICD-10-CM | POA: Diagnosis not present

## 2023-05-31 MED ORDER — AZELASTINE HCL 0.1 % NA SOLN: 2.0000 | Freq: Two times a day (BID) | NASAL | 1 refills | Status: DC

## 2023-05-31 MED ORDER — FEXOFENADINE HCL 180 MG PO TABS: 180.0000 mg | ORAL_TABLET | Freq: Every day | ORAL | 3 refills | Status: DC

## 2023-05-31 NOTE — Progress Notes (Signed)
    Procedures performed today:    None.  Independent interpretation of notes and tests performed by another provider:   None.  Brief History, Exam, Impression, and Recommendations:    Gabriel Juarez is a very pleasant 52 year old male, he has had about a month of nasal discharge, occasional congestion. No cough, fevers, chills, sore throat, no sinus pain or pressure, he does work around a lot of sawdust. He has not considered wearing a dust mask. On exam he has boggy and erythematous turbinates with significant mucus discharge, no sinus pain or pressure, ears, mouth are normal. Neck exam is normal. Adding nasal azelastine, Allegra, he will wear a dust mask at work, and if insufficient improvement over 4 to 6 weeks he will come back for further evaluation.  Chronic process with exacerbation and pharmacologic intervention  ____________________________________________ [name]. Benjamin Stain, M.D., ABFM., CAQSM., AME. Primary Care and Sports Medicine [facility]  Adjunct Professor of Family Medicine  [place] of [facility] of Medicine  Restaurant manager, fast food

## 2023-05-31 NOTE — Assessment & Plan Note (Signed)
Sheri is a very pleasant 52 year old male, he has had about a month of nasal discharge, occasional congestion. No cough, fevers, chills, sore throat, no sinus pain or pressure, he does work around a lot of sawdust. He has not considered wearing a dust mask. On exam he has boggy and erythematous turbinates with significant mucus discharge, no sinus pain or pressure, ears, mouth are normal. Neck exam is normal. Adding nasal azelastine, Allegra, he will wear a dust mask at work, and if insufficient improvement over 4 to 6 weeks he will come back for further evaluation.

## 2023-06-01 ENCOUNTER — Other Ambulatory Visit: Payer: Self-pay | Admitting: Family Medicine

## 2023-06-22 ENCOUNTER — Other Ambulatory Visit: Payer: Self-pay | Admitting: Sports Medicine

## 2023-06-22 DIAGNOSIS — J310 Chronic rhinitis: Secondary | ICD-10-CM

## 2023-07-02 ENCOUNTER — Encounter: Payer: Self-pay | Admitting: Family Medicine

## 2023-07-02 ENCOUNTER — Ambulatory Visit (INDEPENDENT_AMBULATORY_CARE_PROVIDER_SITE_OTHER): Payer: BC Managed Care – PPO | Admitting: Family Medicine

## 2023-07-02 VITALS — BP 125/70 | HR 82 | Ht 67.0 in | Wt 172.0 lb

## 2023-07-02 DIAGNOSIS — J019 Acute sinusitis, unspecified: Secondary | ICD-10-CM | POA: Diagnosis not present

## 2023-07-02 MED ORDER — PREDNISONE 20 MG PO TABS: 40.0000 mg | ORAL_TABLET | Freq: Every day | ORAL | 0 refills | Status: DC

## 2023-07-02 MED ORDER — AMOXICILLIN-POT CLAVULANATE 875-125 MG PO TABS: 1.0000 | ORAL_TABLET | Freq: Two times a day (BID) | ORAL | 0 refills | Status: DC

## 2023-07-02 NOTE — Progress Notes (Signed)
   Established Patient Office Visit  Subjective   Patient ID: Gabriel Juarez, male    DOB: October 19, 1971  Age: 52 y.o. MRN: [id number]  Chief Complaint  Patient presents with   Follow-up    HPI  This here today because he has had head fullness and congestion for about 6 weeks.  He says when he blows his nose he is really not getting a lot of mucus or drainage but it just feels like there is in a normal's amount of pressure in his sinuses and his ears.  No facial tenderness.  No fevers chills or sweats.  He feels like his ears are completely stopped up.  He did do a trial of Astelin and Allegra for the last 3 to 4 weeks and really has not had any improvement in his symptoms.    ROS    Objective:     BP 125/70   Pulse 82   Ht 5\' 7"  (1.702 m)   Wt 172 lb (78 kg)   SpO2 98%   BMI 26.94 kg/m    Physical Exam Constitutional:      Appearance: He is well-developed.  HENT:     Head: Normocephalic and atraumatic.     Right Ear: Tympanic membrane, ear canal and external ear normal.     Left Ear: Tympanic membrane, ear canal and external ear normal.     Nose: Nose normal.     Mouth/Throat:     Pharynx: Oropharynx is clear.  Eyes:     Conjunctiva/sclera: Conjunctivae normal.     Pupils: Pupils are equal, round, and reactive to light.  Neck:     Thyroid: No thyromegaly.  Cardiovascular:     Rate and Rhythm: Normal rate.     Heart sounds: Normal heart sounds.  Pulmonary:     Effort: Pulmonary effort is normal.     Breath sounds: Normal breath sounds.  Musculoskeletal:     Cervical back: Neck supple.  Lymphadenopathy:     Cervical: No cervical adenopathy.  Skin:    General: Skin is warm and dry.  Neurological:     Mental Status: He is alert and oriented to person, place, and time.      No results found for any visits on 07/02/23.    The ASCVD Risk score (Arnett DK, et al., 2019) failed to calculate for the following reasons:   The valid HDL cholesterol range is 20  to 100 mg/dL    Assessment & Plan:   Problem List Items Addressed This Visit   None Visit Diagnoses     Acute non-recurrent sinusitis, unspecified location    -  Primary   Relevant Medications   amoxicillin-clavulanate (AUGMENTIN) 875-125 MG tablet   predniSONE (DELTASONE) 20 MG tablet      ACute sinusitis-will treat with Augmentin and prednisone.  Call if not better in 1 week.  No follow-ups on file.    Nani Gasser, MD

## 2023-07-05 NOTE — Telephone Encounter (Signed)
To PCP

## 2024-03-04 ENCOUNTER — Encounter (INDEPENDENT_AMBULATORY_CARE_PROVIDER_SITE_OTHER): Payer: Self-pay | Admitting: Sports Medicine

## 2024-03-04 DIAGNOSIS — M5030 Other cervical disc degeneration, unspecified cervical region: Secondary | ICD-10-CM | POA: Diagnosis not present

## 2024-03-04 MED ORDER — PREDNISONE 50 MG PO TABS: ORAL_TABLET | ORAL | 0 refills | Status: DC

## 2024-03-04 NOTE — Telephone Encounter (Signed)

## 2024-05-14 ENCOUNTER — Encounter: Payer: Self-pay | Admitting: Family Medicine

## 2024-05-14 ENCOUNTER — Encounter: Payer: Self-pay | Admitting: Sports Medicine

## 2024-05-14 DIAGNOSIS — M5030 Other cervical disc degeneration, unspecified cervical region: Secondary | ICD-10-CM

## 2024-05-14 MED ORDER — PREGABALIN 200 MG PO CAPS
200.0000 mg | ORAL_CAPSULE | Freq: Three times a day (TID) | ORAL | 3 refills | Status: DC
Start: 2024-05-14 — End: 2024-05-18

## 2024-05-14 NOTE — Telephone Encounter (Signed)
 Requesting rx rf of pregabalin  200mg   3 times daily Last written 03/21/2023 Last OV 07/02/2023 ( sick visit)  Upcoming appt = none

## 2024-05-14 NOTE — Telephone Encounter (Signed)
 I can send over some suppositories for the hemorrhoids and also encouraged him to take a stool softerner BID with meals if he having hard stools.    Med pended, not sure of pharmacy

## 2024-05-15 ENCOUNTER — Ambulatory Visit: Payer: Self-pay

## 2024-05-15 NOTE — Telephone Encounter (Signed)
 FYI - the patient is scheduled to see the provider on 05/18/24.

## 2024-05-15 NOTE — Telephone Encounter (Signed)
 Copied from CRM [id number]. Topic: Clinical - Red Word Triage >> May 15, 2024 11:27 AM Brynn Caras wrote: Red Word that prompted transfer to Nurse Triage:  Patient notified his PCP of blood in stool again with a little stomach pain . Pain is a 2/10.  Chief Complaint: rectal bleeding, mild to moderate bright red; hx of this and is NOT on blood thinners.  States happens with every other bm. Symptoms: see above Frequency: comes and goes Pertinent Negatives: Patient denies fever, cp, sob Disposition: [] ED /[] Urgent Care (no appt availability in office) / [x] Appointment(In office/virtual)/ []  [place] [facility]/ [] Home Care/ [] Refused Recommended Disposition /[] [place] [facility]/ []  Follow-up with PCP Additional Notes: apt scheduled for Monday; care advice given, denies questions; instructed to go to ER if becomes worse.   Reason for Disposition  MILD rectal bleeding (more than just a few drops or streaks)  Answer Assessment - Initial Assessment Questions 1. APPEARANCE of BLOOD: "What color is it?" "Is it passed separately, on the surface of the stool, or mixed in with the stool?"      Bright red 2. AMOUNT: "How much blood was passed?"      moderate 3. FREQUENCY: "How many times has blood been passed with the stools?"      Every other time 4. ONSET: "When was the blood first seen in the stools?" (Days or weeks)      A week ago 5. DIARRHEA: "Is there also some diarrhea?" If Yes, ask: "How many diarrhea stools in the past 24 hours?"      no 6. CONSTIPATION: "Do you have constipation?" If Yes, ask: "How bad is it?"     no 7. RECURRENT SYMPTOMS: "Have you had blood in your stools before?" If Yes, ask: "When was the last time?" and "What happened that time?"      Yes a year ago 8. BLOOD THINNERS: "Do you take any blood thinners?" (e.g., Coumadin/warfarin, Pradaxa/dabigatran, aspirin)     denies 9. OTHER SYMPTOMS: "Do you have any other symptoms?"  (e.g., abdomen pain, vomiting, dizziness,  fever)     Abd pain 2/10 10. PREGNANCY: "Is there any chance you are pregnant?" "When was your last menstrual period?"       na  Protocols used: Rectal Bleeding-A-AH

## 2024-05-15 NOTE — Telephone Encounter (Signed)
 Left message for a return call

## 2024-05-18 ENCOUNTER — Encounter: Payer: Self-pay | Admitting: Medical-Surgical

## 2024-05-18 ENCOUNTER — Ambulatory Visit (INDEPENDENT_AMBULATORY_CARE_PROVIDER_SITE_OTHER): Admitting: Medical-Surgical

## 2024-05-18 VITALS — BP 162/76 | HR 67 | Resp 20 | Ht 67.0 in | Wt 181.1 lb

## 2024-05-18 DIAGNOSIS — K648 Other hemorrhoids: Secondary | ICD-10-CM | POA: Insufficient documentation

## 2024-05-18 DIAGNOSIS — I10 Essential (primary) hypertension: Secondary | ICD-10-CM

## 2024-05-18 DIAGNOSIS — G4733 Obstructive sleep apnea (adult) (pediatric): Secondary | ICD-10-CM

## 2024-05-18 MED ORDER — LISINOPRIL 20 MG PO TABS: 20.0000 mg | ORAL_TABLET | Freq: Every day | ORAL | 3 refills | Status: AC

## 2024-05-18 MED ORDER — HYDROCORTISONE ACETATE 25 MG RE SUPP: 25.0000 mg | Freq: Two times a day (BID) | RECTAL | 0 refills | Status: DC | PRN

## 2024-05-18 MED ORDER — AMBULATORY NON FORMULARY MEDICATION
0 refills | Status: AC
Start: 2024-05-18 — End: ?

## 2024-05-18 NOTE — Patient Instructions (Signed)

## 2024-05-18 NOTE — Progress Notes (Signed)
        Established patient visit  History, exam, impression, and plan:  1. OSA (obstructive sleep apnea) (Primary) Very pleasant 53 year old male coming in today with a history of sleep apnea.  He has had a CPAP ordered for him multiple times but has never been contacted regarding getting this set up.  He is still interested in using a CPAP so we plan to print his prescription again and send it off to see if we can get someone in contact with him. - AMBULATORY NON FORMULARY MEDICATION; Medication Name: CPAP set to 5-15 cm water pressure.  AHI was 13.1.  Dispense: 1 Units; Refill: 0  2. Essential hypertension, benign He does have a history of essential hypertension that was previously treated with lisinopril  20 mg daily.  Notes that he has been out of the lisinopril  for at least 3 months and his blood pressure has been running very high.  Denies any concerning symptoms today but wants to get back on the lisinopril  as prescribed.  Cardiopulmonary exam is normal today.  He is due for labs as her last blood draws were done in 2023.  Amenable to blood work so adding orders today.  Restarting lisinopril  20 mg daily.  Return in 2 weeks for nurse visit for blood pressure check then follow-up with PCP as instructed. - CBC with Differential/Platelet - CMP14+EGFR - Lipid panel  3. Internal hemorrhoid, bleeding History of internal hemorrhoids noted on colonoscopy in 2022.  Has intermittent episodes of bright red blood that is visible on the toilet paper and occasionally likely in the toilet.  Notes that he has been experiencing the bleeding off and on over the last 2 weeks.  Feels that it happens with about every other bowel movement.  Does have to strain at times.  Previously tried stool softeners but has not been doing that for a while.  Discussed recommendations for treatment.  We will start with Anusol  suppositories for now and see how that works.  If that is unhelpful, consider phenylephrine rectal  suppositories instead.  Discussed referral to GI for discussion of surgical removal but he is not ready for that right now.  He will let us  know if he changes his mind.  Procedures performed this visit: None.  Return in about 2 weeks (around 06/01/2024) for nurse visit for BP check.  __________________________________ Maryl Snook, DNP, APRN, FNP-BC Primary Care and Sports Medicine [facility]

## 2024-05-19 ENCOUNTER — Ambulatory Visit: Admitting: Sports Medicine

## 2024-05-19 ENCOUNTER — Ambulatory Visit: Payer: Self-pay | Admitting: Medical-Surgical

## 2024-05-19 DIAGNOSIS — E785 Hyperlipidemia, unspecified: Secondary | ICD-10-CM

## 2024-05-19 LAB — LIPID PANEL
Chol/HDL Ratio: 3 ratio (ref 0.0–5.0)
Cholesterol, Total: 201 mg/dL — ABNORMAL HIGH (ref 100–199)
HDL: 67 mg/dL (ref >39)
LDL Chol Calc (NIH): 119 mg/dL — ABNORMAL HIGH (ref 0–99)
Triglycerides: 85 mg/dL (ref 0–149)
VLDL Cholesterol Cal: 15 mg/dL (ref 5–40)

## 2024-05-19 LAB — CMP14+EGFR
ALT: 19 IU/L (ref 0–44)
AST: 21 IU/L (ref 0–40)
Albumin: 4.5 g/dL (ref 3.8–4.9)
Alkaline Phosphatase: 101 IU/L (ref 44–121)
BUN/Creatinine Ratio: 17 (ref 9–20)
BUN: 18 mg/dL (ref 6–24)
Bilirubin Total: 0.5 mg/dL (ref 0.0–1.2)
CO2: 22 mmol/L (ref 20–29)
Calcium: 9.5 mg/dL (ref 8.7–10.2)
Chloride: 102 mmol/L (ref 96–106)
Creatinine, Ser: 1.05 mg/dL (ref 0.76–1.27)
Globulin, Total: 2.8 g/dL (ref 1.5–4.5)
Glucose: 101 mg/dL — ABNORMAL HIGH (ref 70–99)
Potassium: 4.8 mmol/L (ref 3.5–5.2)
Sodium: 136 mmol/L (ref 134–144)
Total Protein: 7.3 g/dL (ref 6.0–8.5)
eGFR: 85 mL/min/{1.73_m2} (ref >59)

## 2024-05-19 LAB — CBC WITH DIFFERENTIAL/PLATELET
Basophils Absolute: 0 10*3/uL (ref 0.0–0.2)
Basos: 0 %
EOS (ABSOLUTE): 0.1 10*3/uL (ref 0.0–0.4)
Eos: 1 %
Hematocrit: 44.2 % (ref 37.5–51.0)
Hemoglobin: 15.2 g/dL (ref 13.0–17.7)
Immature Grans (Abs): 0 10*3/uL (ref 0.0–0.1)
Immature Granulocytes: 0 %
Lymphocytes Absolute: 2.7 10*3/uL (ref 0.7–3.1)
Lymphs: 40 %
MCH: 34 pg — ABNORMAL HIGH (ref 26.6–33.0)
MCHC: 34.4 g/dL (ref 31.5–35.7)
MCV: 99 fL — ABNORMAL HIGH (ref 79–97)
Monocytes Absolute: 0.6 10*3/uL (ref 0.1–0.9)
Monocytes: 9 %
Neutrophils Absolute: 3.4 10*3/uL (ref 1.4–7.0)
Neutrophils: 50 %
Platelets: 301 10*3/uL (ref 150–450)
RBC: 4.47 x10E6/uL (ref 4.14–5.80)
RDW: 11.7 % (ref 11.6–15.4)
WBC: 6.8 10*3/uL (ref 3.4–10.8)

## 2024-05-19 MED ORDER — HYDROCORTISONE ACETATE 25 MG RE SUPP: 25.0000 mg | Freq: Two times a day (BID) | RECTAL | 0 refills | Status: DC

## 2024-05-19 MED ORDER — ATORVASTATIN CALCIUM 10 MG PO TABS: 10.0000 mg | ORAL_TABLET | Freq: Every day | ORAL | 3 refills | Status: DC

## 2024-05-19 NOTE — Telephone Encounter (Signed)
 Meds ordered this encounter  Medications   hydrocortisone  (ANUSOL -HC) 25 MG suppository    Sig: Place 1 suppository (25 mg total) rectally 2 (two) times daily.    Dispense:  12 suppository    Refill:  0

## 2024-05-19 NOTE — Addendum Note (Signed)
 Addended by: Shemar Plemmons D on: 05/19/2024 09:43 AM   Modules accepted: Orders

## 2024-05-21 MED ORDER — ATORVASTATIN CALCIUM 10 MG PO TABS: 10.0000 mg | ORAL_TABLET | Freq: Every day | ORAL | 3 refills | Status: AC

## 2024-05-21 NOTE — Addendum Note (Signed)
 Addended by: Izora Marten on: 05/21/2024 12:57 PM   Modules accepted: Orders

## 2024-05-22 ENCOUNTER — Ambulatory Visit (INDEPENDENT_AMBULATORY_CARE_PROVIDER_SITE_OTHER): Admitting: Sports Medicine

## 2024-05-22 ENCOUNTER — Encounter: Payer: Self-pay | Admitting: Sports Medicine

## 2024-05-22 DIAGNOSIS — M5030 Other cervical disc degeneration, unspecified cervical region: Secondary | ICD-10-CM

## 2024-05-22 MED ORDER — PREGABALIN 75 MG PO CAPS: ORAL_CAPSULE | ORAL | 3 refills | Status: DC

## 2024-05-22 MED ORDER — PREDNISONE 50 MG PO TABS: ORAL_TABLET | ORAL | 0 refills | Status: DC

## 2024-05-22 NOTE — Assessment & Plan Note (Signed)
 This pleasant 53 year old male returns, known cervical DDD, negative nerve conduction studies, his last epidural was in 2022. He has self discontinued his Lyrica  and Celebrex  and not surprisingly the pain recurred. We will start a 5-day burst of prednisone , I will restart Lyrica  at a lower dose, he was historically on the maximum dose. Adding formal physical therapy, return to see me in 6 weeks, we will continue dose titration of Lyrica  if not better. Of note if he does get an epidural he would prefer Dr. Melonie Square do it.

## 2024-05-22 NOTE — Progress Notes (Signed)
    Procedures performed today:    None.  Independent interpretation of notes and tests performed by another provider:   None.  Brief History, Exam, Impression, and Recommendations:    DDD (degenerative disc disease), cervical This pleasant 53 year old male returns, known cervical DDD, negative nerve conduction studies, his last epidural was in 2022. He has self discontinued his Lyrica  and Celebrex  and not surprisingly the pain recurred. We will start a 5-day burst of prednisone , I will restart Lyrica  at a lower dose, he was historically on the maximum dose. Adding formal physical therapy, return to see me in 6 weeks, we will continue dose titration of Lyrica  if not better. Of note if he does get an epidural he would prefer Dr. Melonie Square do it.    ____________________________________________ Joselyn Nicely. Sandy Crumb, M.D., ABFM., CAQSM., AME. Primary Care and Sports Medicine [facility]  Adjunct Professor of [facility] Medicine  [employer]  Restaurant manager, fast food

## 2024-06-01 ENCOUNTER — Other Ambulatory Visit: Payer: Self-pay

## 2024-06-01 ENCOUNTER — Ambulatory Visit: Attending: Sports Medicine | Admitting: Rehabilitative and Restorative Service Providers"

## 2024-06-01 ENCOUNTER — Encounter: Payer: Self-pay | Admitting: Rehabilitative and Restorative Service Providers"

## 2024-06-01 DIAGNOSIS — R29818 Other symptoms and signs involving the nervous system: Secondary | ICD-10-CM | POA: Insufficient documentation

## 2024-06-01 DIAGNOSIS — M6281 Muscle weakness (generalized): Secondary | ICD-10-CM | POA: Diagnosis not present

## 2024-06-01 DIAGNOSIS — M5030 Other cervical disc degeneration, unspecified cervical region: Secondary | ICD-10-CM | POA: Diagnosis present

## 2024-06-01 DIAGNOSIS — M542 Cervicalgia: Secondary | ICD-10-CM | POA: Diagnosis present

## 2024-06-01 NOTE — Therapy (Addendum)
 OUTPATIENT PHYSICAL THERAPY CERVICAL EVALUATION   Patient Name: Gabriel Juarez MRN: [id number] DOB:October 03, 1971, 53 y.o., male Today's Date: 06/01/2024  END OF SESSION:  PT End of Session - 06/01/24 1046     Visit Number 1    Number of Visits 16    Date for PT Re-Evaluation 07/31/24    Authorization Type UHC medicaid    PT Start Time 1050    PT Stop Time 1135    PT Time Calculation (min) 45 min    Activity Tolerance Patient tolerated treatment well    Behavior During Therapy [facility] for tasks assessed/performed          Past Medical History:  Diagnosis Date   Hypertension    Past Surgical History:  Procedure Laterality Date   NO PAST SURGERIES     Patient Active Problem List   Diagnosis Date Noted   Internal hemorrhoid, bleeding 05/18/2024   Rhinitis 05/31/2023   Low serum vitamin B12 08/31/2022   GERD (gastroesophageal reflux disease) 02/28/2022   Primary osteoarthritis of left knee 11/02/2021   Left groin pain 04/05/2020   OSA (obstructive sleep apnea) 02/03/2020   Chest trauma 06/27/2018   Hypersomnia 01/24/2017   Insomnia 10/13/2015   DDD (degenerative disc disease), cervical 01/04/2014   Axial low back pain 05/14/2013   Essential hypertension, benign 10/01/2012    PCP: Dorothyann Byars, MD REFERRING PROVIDER: Debby Petties, MD  REFERRING DIAG: M50.30 (ICD-10-CM) - DDD (degenerative disc disease), cervical   THERAPY DIAG:  Cervicalgia  Muscle weakness (generalized)  Other symptoms and signs involving the nervous system  Rationale for Evaluation and Treatment: Rehabilitation  ONSET DATE: 05/22/24  SUBJECTIVE:                                                                                                                                                                                                         SUBJECTIVE STATEMENT: The patient reports he has h/o neck pain and lower back pain x years. He reports he cannot turn to the L side and gets  pain shooting down the L arm when he lays on it. It feels like I need it to pop one good time. Pt had no known injury for current exacerbation of pain. Hand dominance: Right  PERTINENT HISTORY:  HTN  PAIN:  Are you having pain? Yes: NPRS scale: 7/10 Pain location: cervical spine into upper mid back Pain description: shooting at times down the arm, annoying like  a little stick Aggravating factors: turning to the L and laying on the L side Relieving factors: meds at  times-- just started prednisone   PRECAUTIONS: None  WEIGHT BEARING RESTRICTIONS: No  FALLS:  Has patient fallen in last 6 months? 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 [name]  OBJECTIVE:  Note: Objective measures were completed at Evaluation unless otherwise noted.  DIAGNOSTIC FINDINGS:  01/07/21: IMPRESSION: 1. Multilevel cervical spondylosis, greatest at C5-6 where there is mild canal stenosis and moderate bilateral foraminal stenosis. 2. Moderate left foraminal stenosis at C3-4 and mild-moderate left foraminal stenosis at C4-5. 06/18/22: IMPRESSION: Moderate degenerative joint changes of C4 through C7, more advanced compared prior exam.   PATIENT SURVEYS:  NDI: 36% NECK DISABILITY INDEX  Date: 06/01/24 Score  Pain intensity 3 = The pain is fairly severe at the moment  2. Personal care (washing, dressing, etc.) 0 = I can look after myself normally without causing extra pain  3. Lifting 1 =  I can lift heavy weights but it gives extra pain  4. Reading 2 =  I can read as much as I want with moderate pain in my neck  5. Headaches 0 = I have no headaches at all  6. Concentration 1 =  I can concentrate fully when I want to with slight difficulty   7. Work 2 =  I can do most of my usual work, but no more  8. Driving 3 = I can't drive my car as long as I want because of moderate pain in my neck  9. Sleeping 4 = My sleep is greatly disturbed (3-5 hrs sleepless)   10. Recreation 2 = I am able to engage in most, but not all of my usual recreation activities because of   pain in my neck  Total 18/50   Minimum Detectable Change (90% confidence): 5 points or 10% points  COGNITION: Overall cognitive status: Within functional limits for tasks assessed  SENSATION: Mild numbness L Ue  POSTURE: No Significant postural limitations  PALPATION: Tender to palpation of bilateral cervical spine musculature; tightness in bilat upper trap; hypomobility in thoracic spine for PA mobilization   CERVICAL ROM:  Active ROM A/PROM (deg) eval  Flexion 60  Extension 60  Right lateral flexion 28  Left lateral flexion 22  Right rotation 62  Left rotation 40 *pain stops   (Blank rows = not tested)  UPPER EXTREMITY ROM: WFLs  UPPER EXTREMITY MMT: MMT Right eval Left eval  Shoulder flexion 5/5 4+/5  Shoulder extension    Shoulder abduction 5/5 4+/5  Shoulder adduction    Shoulder extension    Shoulder internal rotation    Shoulder external rotation    Middle trapezius    Lower trapezius    Elbow flexion 5/5 4+/5  Elbow extension 5/5 5/5  Wrist flexion 5/5 5/5  Wrist extension 5/5 5/5  Wrist ulnar deviation    Wrist radial deviation    Wrist pronation    Wrist supination    Grip strength     (Blank rows = not tested)  CERVICAL SPECIAL TESTS: PROM flexion painful in lower cervical spine   [facility] Adult PT Treatment:  DATE: 06/01/24 Therapeutic Exercise: HEP established-- see below Also tried wall open book thoracic spine Therapeutic Activity: Sleeping position on the L side using a towel roll under C spine (in pillow case) Patient able to maintain this position without worsening symptoms in therapy                                                                                                            PATIENT EDUCATION:  Education details: HEP Person educated: Patient Education method: Explanation, Demonstration, and Handouts Education comprehension: verbalized understanding, returned demonstration, and needs further education  HOME EXERCISE PROGRAM: Access Code: [email or web address] URL: [email or web address] Date: 06/01/2024 Prepared by: Tawni Ferrier  Program Notes These should not increase pain. If one exercise irritates pain, hold that one until you return.  Exercises - Towel Roll Stretch  - 1 x daily - 5 x weekly - 1 sets - 10 reps - Supine Thoracic Mobilization Towel Roll Vertical with Arm Stretch  - 1 x daily - 5 x weekly - 1 sets - 2 reps - 60 -120 seconds hold - Seated Thoracic Lumbar Extension with Pectoralis Stretch  - 2 x daily - 7 x weekly - 1 sets - 5 reps - Standing with Forearms Thoracic Rotation  - 1 x daily - 5 x weekly - 1 sets - 10 reps - Standing Isometric Cervical Sidebending with Manual Resistance  - 1 x daily - 5 x weekly - 1 sets - 5 reps - 5seconds hold  ASSESSMENT:  CLINICAL IMPRESSION: Patient is a 53 y.o. male who was seen today for physical therapy evaluation and treatment for neck pain.  He notes a h/o neck pain with a recent flare that is limiting sleeping and head turns to the L side. The patient responded well to initiation of treatment today with an improvement in L neck rotation from 40 degrees up to 55 degrees after isometrics. PT to continue to work to improve ROM, improve strength, and work on postural stabilization.   OBJECTIVE IMPAIRMENTS: decreased activity tolerance, decreased ROM, decreased strength, hypomobility, increased fascial restrictions, impaired flexibility, postural dysfunction, and pain.   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   REHAB POTENTIAL: Good  CLINICAL DECISION MAKING: Evolving/moderate complexity  EVALUATION COMPLEXITY: Moderate   GOALS: Goals reviewed with patient? Yes  SHORT TERM GOALS: Target date: 06/30/24  The patient will be indep with initial HEP Baseline: initiated at eval Goal status: INITIAL  2.  The patient will report being able to sleep through the night without pain.  Baseline:  Cannot sleep, especially when lying on the L side. Goal status: INITIAL  3.  The patient will have 4/10 pain noting improved tolerance for daily tasks. Baseline:  7/10 at rest Goal status: INITIAL   LONG TERM GOALS: Target date: 07/31/24  The patient will be indep with progression of HEP. Baseline:  initiated at eval Goal status: INITIAL  2.  The patient will improve NDI to 20% to demonstrate improved functional abilities. Baseline: 36% Goal status: INITIAL  3.  The patient will improve L cervical rotation to = that of R side. Baseline:  diminished to L Goal status: INITIAL  4.  The patient will report pain < or equal to 2/10 to return to driving, lifting, and sleeping. Baseline:  7/10 Goal status: INITIAL  5.  Improve L shoulder strength to 5/5 for full strength for lifting tasks at work. Baseline:  see chart Goal status: INITIAL  PLAN:  PT FREQUENCY: 2x/week  PT DURATION: 8 weeks  PLANNED INTERVENTIONS: 97164- PT Re-evaluation, 97750- Physical Performance Testing, 97110-Therapeutic exercises, 97530- Therapeutic activity, 97112- Neuromuscular re-education, 97535- Self Care, 02859- Manual therapy, G0283- Electrical stimulation (unattended), [phone]- Traction (mechanical), Patient/Family education, Taping, Joint mobilization, Joint manipulation, Spinal mobilization, Cryotherapy, and Moist heat  PLAN FOR NEXT SESSION: check HEP, thoracic mobilization, Cervical thoracic Junction mobilization,  scapular stabilization, neck ROM, UE strengthening.   Cheresa Siers, PT 06/01/2024, 12:55 PM

## 2024-06-03 ENCOUNTER — Ambulatory Visit

## 2024-06-04 ENCOUNTER — Encounter: Admitting: Rehabilitative and Restorative Service Providers"

## 2024-06-08 ENCOUNTER — Ambulatory Visit: Admitting: Rehabilitative and Restorative Service Providers"

## 2024-06-08 ENCOUNTER — Encounter: Payer: Self-pay | Admitting: Rehabilitative and Restorative Service Providers"

## 2024-06-08 DIAGNOSIS — M6281 Muscle weakness (generalized): Secondary | ICD-10-CM

## 2024-06-08 DIAGNOSIS — M542 Cervicalgia: Secondary | ICD-10-CM

## 2024-06-08 DIAGNOSIS — R29818 Other symptoms and signs involving the nervous system: Secondary | ICD-10-CM

## 2024-06-08 NOTE — Therapy (Signed)
 OUTPATIENT PHYSICAL THERAPY CERVICAL TREATMENT   Patient Name: Gabriel Juarez MRN: [id number] DOB:09-10-1971, 53 y.o., male Today's Date: 06/08/2024  END OF SESSION:  PT End of Session - 06/08/24 1443     Visit Number 2    Number of Visits 16    Date for PT Re-Evaluation 07/31/24    Authorization Type UHC medicaid    PT Start Time 1442    PT Stop Time 1527    PT Time Calculation (min) 45 min    Activity Tolerance Patient tolerated treatment well          Past Medical History:  Diagnosis Date   Hypertension    Past Surgical History:  Procedure Laterality Date   NO PAST SURGERIES     Patient Active Problem List   Diagnosis Date Noted   Internal hemorrhoid, bleeding 05/18/2024   Rhinitis 05/31/2023   Low serum vitamin B12 08/31/2022   GERD (gastroesophageal reflux disease) 02/28/2022   Primary osteoarthritis of left knee 11/02/2021   Left groin pain 04/05/2020   OSA (obstructive sleep apnea) 02/03/2020   Chest trauma 06/27/2018   Hypersomnia 01/24/2017   Insomnia 10/13/2015   DDD (degenerative disc disease), cervical 01/04/2014   Axial low back pain 05/14/2013   Essential hypertension, benign 10/01/2012    PCP: Dorothyann Byars, MD REFERRING PROVIDER: Debby Petties, MD  REFERRING DIAG: M50.30 (ICD-10-CM) - DDD (degenerative disc disease), cervical   THERAPY DIAG:  Cervicalgia  Muscle weakness (generalized)  Other symptoms and signs involving the nervous system  Rationale for Evaluation and Treatment: Rehabilitation  ONSET DATE: 05/22/24  SUBJECTIVE:                                                                                                                                                                                                         SUBJECTIVE STATEMENT: Patient report that he has been working on his exercises daily. A couple of exercises help but can't tell anything with the others. Pain is still the same 7/10.   Eval: The  patient reports he has h/o neck pain and lower back pain x years. He reports he cannot turn to the L side and gets pain shooting down the L arm when he lays on it. It feels like I need it to pop one good time. Pt had no known injury for current exacerbation of pain. This pleasant 53 year old male returns, known cervical DDD, negative nerve conduction studies, his last epidural was in 2022. He has self discontinued his Lyrica  and Celebrex  and not surprisingly the pain recurred. Hand dominance: Right  PERTINENT HISTORY:  HTN  PAIN:  Are you having pain? Yes: NPRS scale: 7/10 Pain location: cervical spine into upper mid back Pain description: shooting at times down the arm, annoying like  a little stick Aggravating factors: turning to the L and laying on the L side Relieving factors: meds at times-- just started prednisone   PRECAUTIONS: None  WEIGHT BEARING RESTRICTIONS: No  FALLS:  Has patient fallen in last 6 months? 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 [name]  OBJECTIVE:  Note: Objective measures were completed at Evaluation unless otherwise noted.  DIAGNOSTIC FINDINGS:  01/07/21: IMPRESSION: 1. Multilevel cervical spondylosis, greatest at C5-6 where there is mild canal stenosis and moderate bilateral foraminal stenosis. 2. Moderate left foraminal stenosis at C3-4 and mild-moderate left foraminal stenosis at C4-5. 06/18/22: IMPRESSION: Moderate degenerative joint changes of C4 through C7, more advanced compared prior exam.   PATIENT SURVEYS:  NDI: 36% NECK DISABILITY INDEX  Date: 06/01/24 Score  Pain intensity 3 = The pain is fairly severe at the moment  2. Personal care (washing, dressing, etc.) 0 = I can look after myself normally  without causing extra pain  3. Lifting 1 =  I can lift heavy weights but it gives extra pain  4. Reading 2 =  I can read as much as I want with moderate pain in my neck  5. Headaches 0 = I have no headaches at all  6. Concentration 1 =  I can concentrate fully when I want to with slight difficulty   7. Work 2 = I can do most of my usual work, but no more  8. Driving 3 = I can't drive my car as long as I want because of moderate pain in my neck  9. Sleeping 4 = My sleep is greatly disturbed (3-5 hrs sleepless)   10. Recreation 2 = I am able to engage in most, but not all of my usual recreation activities because of   pain in my neck  Total 18/50   Minimum Detectable Change (90% confidence): 5 points or 10% points  SENSATION: Mild numbness L Ue  POSTURE: No Significant postural limitations  PALPATION: Tender to palpation of bilateral cervical spine musculature; tightness in bilat upper trap; hypomobility in thoracic spine for PA mobilization   CERVICAL ROM:  Active ROM A/PROM (deg) eval  Flexion 60  Extension 60  Right lateral flexion 28  Left lateral flexion 22  Right rotation 62  Left rotation 40 *pain stops   (Blank rows = not tested)  UPPER EXTREMITY ROM: WFLs  UPPER EXTREMITY MMT: MMT Right eval Left eval  Shoulder flexion 5/5 4+/5  Shoulder extension    Shoulder abduction 5/5 4+/5  Shoulder adduction    Shoulder extension    Shoulder internal rotation    Shoulder external rotation    Middle trapezius    Lower trapezius    Elbow flexion 5/5 4+/5  Elbow extension 5/5 5/5  Wrist flexion 5/5 5/5  Wrist extension 5/5 5/5  Wrist ulnar deviation    Wrist radial deviation    Wrist pronation    Wrist supination    Grip strength     (Blank rows = not tested)  CERVICAL SPECIAL TESTS: PROM flexion painful in lower cervical spine  [facility] Adult PT Treatment:                                                DATE: 06/08/24 Therapeutic Exercise: Doorway pec stretch 3  positions 30 sec x 3 each position  Manual Therapy: Thoracic and lower cervical mobs patient sitting leaning forward head supported on pillows on table for PA and lateral mobs  Assisted pec/anterior chest stretch in sitting coregeous ball thoracic spine  Neuromuscular re-ed: Postural correction in sitting and standing with noodle  Chin tuck with noodle 10 sec x 10  Scap squeeze with noodle 10 sec x 10  Therapeutic Activity: Scap squeeze with ER with noodle 3- 5 sec x 10 W with noodle 3-5 sec x 10 t Standing thoracic extension with pec stretch x 10 Sitting resisted cervical sidebending 5 sec x 5 R/L  Self Care: Importance of posture and alignment in spine health Sitting with noodle  Working on exercises consistently    [facility] Adult PT Treatment:                                                DATE: 06/01/24 Therapeutic Exercise: HEP established-- see below Also tried wall open book thoracic spine Therapeutic Activity: Sleeping position on the L side using a towel roll under C spine (in pillow case) Patient able to maintain this position without worsening symptoms in therapy                                                                                                           PATIENT EDUCATION:  Education details: HEP Person educated: Patient Education method: Explanation, Demonstration, and Handouts Education comprehension: verbalized understanding, returned demonstration, and needs further education  HOME EXERCISE PROGRAM: Access Code: [email or web address] URL: [email or web address] Date: 06/08/2024 Prepared by: Melodie Ashworth  Program Notes These should not increase pain. If one exercise irritates pain, hold that one until you return.  Exercises - Towel Roll Stretch  - 1 x daily - 5 x weekly - 1 sets - 10 reps - Supine Thoracic Mobilization Towel Roll Vertical with Arm Stretch  - 1 x daily - 5 x weekly - 1 sets - 2 reps - 60 -120 seconds hold - Seated Thoracic Lumbar  Extension with Pectoralis Stretch  - 2 x daily - 7 x weekly - 1 sets - 5 reps - Standing with Forearms Thoracic Rotation  - 1 x daily - 5 x weekly - 1 sets - 10 reps - Standing Isometric Cervical Sidebending with Manual Resistance  - 1 x daily - 5 x weekly - 1 sets - 5 reps - 5seconds hold - Seated Cervical Retraction  - 2 x daily - 7 x weekly - 1-2 sets - 5-10 reps - 10 sec  hold - Seated Scapular  Retraction  - 2 x daily - 7 x weekly - 1-2 sets - 10 reps - 10 sec  hold - Shoulder External Rotation and Scapular Retraction  - 3 x daily - 7 x weekly - 1 sets - 10 reps - 3-5 sec   hold - Standing Shoulder W at Wall  - 1-2 x daily - 7 x weekly - 1 sets - 10 reps - 3 sec  hold - Doorway Pec Stretch at 60 Degrees Abduction  - 3 x daily - 7 x weekly - 1 sets - 3 reps - Doorway Pec Stretch at 90 Degrees Abduction  - 3 x daily - 7 x weekly - 1 sets - 3 reps - 30 seconds  hold - Doorway Pec Stretch at 120 Degrees Abduction  - 3 x daily - 7 x weekly - 1 sets - 3 reps - 30 second hold  hold  ASSESSMENT:  CLINICAL IMPRESSION: Patient returns reporting no change in presenting symptoms. Reviewed and progressed exercises adding stretch for anterior chest/pecs and activation of posterior shoulder girdle musculature. Trial of thoracic mobs with patient in sitting tolerated well. Encouraged patient to work on upright posture with posterior shoulder girdle engaged    Eval: Patient is a 53 y.o. male who was seen today for physical therapy evaluation and treatment for neck pain.  He notes a h/o neck pain with a recent flare that is limiting sleeping and head turns to the L side. The patient responded well to initiation of treatment today with an improvement in L neck rotation from 40 degrees up to 55 degrees after isometrics. PT to continue to work to improve ROM, improve strength, and work on postural stabilization.   OBJECTIVE IMPAIRMENTS: decreased activity tolerance, decreased ROM, decreased strength,  hypomobility, increased fascial restrictions, impaired flexibility, postural dysfunction, and pain.   GOALS: Goals reviewed with patient? Yes  SHORT TERM GOALS: Target date: 06/30/24  The patient will be indep with initial HEP Baseline: initiated at eval Goal status: INITIAL  2.  The patient will report being able to sleep through the night without pain.  Baseline:  Cannot sleep, especially when lying on the L side. Goal status: INITIAL  3.  The patient will have 4/10 pain noting improved tolerance for daily tasks. Baseline:  7/10 at rest Goal status: INITIAL   LONG TERM GOALS: Target date: 07/31/24  The patient will be indep with progression of HEP. Baseline:  initiated at eval Goal status: INITIAL  2.  The patient will improve NDI to 20% to demonstrate improved functional abilities. Baseline: 36% Goal status: INITIAL  3.  The patient will improve L cervical rotation to = that of R side. Baseline:  diminished to L Goal status: INITIAL  4.  The patient will report pain < or equal to 2/10 to return to driving, lifting, and sleeping. Baseline:  7/10 Goal status: INITIAL  5.  Improve L shoulder strength to 5/5 for full strength for lifting tasks at work. Baseline:  see chart Goal status: INITIAL  PLAN:  PT FREQUENCY: 2x/week  PT DURATION: 8 weeks  PLANNED INTERVENTIONS: 97164- PT Re-evaluation, 97750- Physical Performance Testing, 97110-Therapeutic exercises, 97530- Therapeutic activity, 97112- Neuromuscular re-education, 97535- Self Care, 02859- Manual therapy, G0283- Electrical stimulation (unattended), [phone]- Traction (mechanical), Patient/Family education, Taping, Joint mobilization, Joint manipulation, Spinal mobilization, Cryotherapy, and Moist heat  PLAN FOR NEXT SESSION: check HEP, thoracic mobilization, Cervical thoracic Junction mobilization, scapular stabilization, neck ROM, UE strengthening.   Milburn Freeney P Kenslei Hearty, PT 06/08/2024, 3:28  PM

## 2024-06-10 ENCOUNTER — Ambulatory Visit

## 2024-06-10 ENCOUNTER — Ambulatory Visit: Admitting: Sports Medicine

## 2024-06-10 VITALS — BP 105/58 | HR 99 | Temp 98.4°F | Ht 67.0 in | Wt 182.0 lb

## 2024-06-10 DIAGNOSIS — I10 Essential (primary) hypertension: Secondary | ICD-10-CM

## 2024-06-10 DIAGNOSIS — M5030 Other cervical disc degeneration, unspecified cervical region: Secondary | ICD-10-CM

## 2024-06-10 MED ORDER — PREDNISONE 50 MG PO TABS: ORAL_TABLET | ORAL | 0 refills | Status: DC

## 2024-06-10 MED ORDER — CELECOXIB 200 MG PO CAPS: ORAL_CAPSULE | ORAL | 2 refills | Status: DC

## 2024-06-10 NOTE — Patient Instructions (Signed)
 Take half of lisinopril , 10 mg by mouth once daily and then to fup in 6 months with pcp.

## 2024-06-10 NOTE — Progress Notes (Signed)
 Patient presents in office for blood pressure check, Pt was seen in office by Zada Palin  and told to restart lisinopril  20 mg daily.Patient states that his blood pressures at home read in 120's over 80's, he's states he has some anxiety  at home when he takes it, he also mentioned  some neck and back pain ranging  between 7/10, he wanted some refills on prednisone  and Celebrex , Pt's blood pressure today in office  was 114/65 and then 105/58, Pcp informed, Pt advised to take half of lisinopril  10 mg by mouth once daily and then to fup in 6 months with pcp.Refill sent.Medications reviewed during visit, pt reports no changes or skipped doses.

## 2024-06-12 ENCOUNTER — Ambulatory Visit: Admitting: Rehabilitative and Restorative Service Providers"

## 2024-06-12 ENCOUNTER — Encounter: Payer: Self-pay | Admitting: Rehabilitative and Restorative Service Providers"

## 2024-06-12 DIAGNOSIS — R29818 Other symptoms and signs involving the nervous system: Secondary | ICD-10-CM

## 2024-06-12 DIAGNOSIS — M542 Cervicalgia: Secondary | ICD-10-CM

## 2024-06-12 DIAGNOSIS — M5030 Other cervical disc degeneration, unspecified cervical region: Secondary | ICD-10-CM | POA: Diagnosis not present

## 2024-06-12 DIAGNOSIS — M6281 Muscle weakness (generalized): Secondary | ICD-10-CM

## 2024-06-12 NOTE — Therapy (Signed)
 OUTPATIENT PHYSICAL THERAPY CERVICAL TREATMENT  Patient Name: Lucio Litsey MRN: [id number] DOB:26-Oct-1971, 53 y.o., male Today's Date: 06/12/2024  END OF SESSION:  PT End of Session - 06/12/24 0851     Visit Number 3    Number of Visits 16    Date for PT Re-Evaluation 07/31/24    Authorization Type [employer] medicaid    PT Start Time [phone]    PT Stop Time 0941    PT Time Calculation (min) 50 min    Activity Tolerance Patient tolerated treatment well    Behavior During Therapy [facility] for tasks assessed/performed          Past Medical History:  Diagnosis Date   Hypertension    Past Surgical History:  Procedure Laterality Date   NO PAST SURGERIES     Patient Active Problem List   Diagnosis Date Noted   Internal hemorrhoid, bleeding 05/18/2024   Rhinitis 05/31/2023   Low serum vitamin B12 08/31/2022   GERD (gastroesophageal reflux disease) 02/28/2022   Primary osteoarthritis of left knee 11/02/2021   Left groin pain 04/05/2020   OSA (obstructive sleep apnea) 02/03/2020   Chest trauma 06/27/2018   Hypersomnia 01/24/2017   Insomnia 10/13/2015   DDD (degenerative disc disease), cervical 01/04/2014   Axial low back pain 05/14/2013   Essential hypertension, benign 10/01/2012    PCP: Dorothyann Byars, MD REFERRING PROVIDER: Debby Petties, MD REFERRING DIAG: M50.30 (ICD-10-CM) - DDD (degenerative disc disease), cervical   THERAPY DIAG:  Cervicalgia  Muscle weakness (generalized)  Other symptoms and signs involving the nervous system  Rationale for Evaluation and Treatment: Rehabilitation  ONSET DATE: 05/22/24  SUBJECTIVE:                                                                                                                                                                                                        SUBJECTIVE STATEMENT: The patient reports the towel roll exercises help. He is sore from working too long on Tues/Wed. He woke up last night in the  middle of night and arrives with 8/10 pain. His pain is upper cervical spine and radiates into the L arm. He is starting prednisone -- saw MD.   Eval: The patient reports he has h/o neck pain and lower back pain x years. He reports he cannot turn to the L side and gets pain shooting down the L arm when he lays on it. It feels like I need it to pop one good time. Pt had no known injury for current exacerbation of pain. This pleasant 53 year old male returns, known cervical  DDD, negative nerve conduction studies, his last epidural was in 2022. He has self discontinued his Lyrica  and Celebrex  and not surprisingly the pain recurred. Hand dominance: Right  PERTINENT HISTORY:  HTN  PAIN:  Are you having pain? Yes: NPRS scale: 8/10 Pain location: cervical spine into upper mid back Pain description: shooting at times down the arm, annoying like  a little stick Aggravating factors: turning to the L and laying on the L side Relieving factors: meds at times-- just started prednisone   PRECAUTIONS: None  WEIGHT BEARING RESTRICTIONS: No  FALLS:  Has patient fallen in last 6 months? 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 [name]  OBJECTIVE:  Note: Objective measures were completed at Evaluation unless otherwise noted.  DIAGNOSTIC FINDINGS:  01/07/21: IMPRESSION: 1. Multilevel cervical spondylosis, greatest at C5-6 where there is mild canal stenosis and moderate bilateral foraminal stenosis. 2. Moderate left foraminal stenosis at C3-4 and mild-moderate left foraminal stenosis at C4-5. 06/18/22: IMPRESSION: Moderate degenerative joint changes of C4 through C7, more advanced compared prior exam.   PATIENT SURVEYS:  NDI: 36% NECK DISABILITY INDEX  Date: 06/01/24 Score  Pain intensity 3 = The pain is fairly severe at the moment  2. Personal care  (washing, dressing, etc.) 0 = I can look after myself normally without causing extra pain  3. Lifting 1 =  I can lift heavy weights but it gives extra pain  4. Reading 2 =  I can read as much as I want with moderate pain in my neck  5. Headaches 0 = I have no headaches at all  6. Concentration 1 =  I can concentrate fully when I want to with slight difficulty   7. Work 2 = I can do most of my usual work, but no more  8. Driving 3 = I can't drive my car as long as I want because of moderate pain in my neck  9. Sleeping 4 = My sleep is greatly disturbed (3-5 hrs sleepless)   10. Recreation 2 = I am able to engage in most, but not all of my usual recreation activities because of   pain in my neck  Total 18/50   Minimum Detectable Change (90% confidence): 5 points or 10% points  SENSATION: Mild numbness L Ue  POSTURE: No Significant postural limitations  PALPATION: Tender to palpation of bilateral cervical spine musculature; tightness in bilat upper trap; hypomobility in thoracic spine for PA mobilization   CERVICAL ROM:  Active ROM A/PROM (deg) eval  Flexion 60  Extension 60  Right lateral flexion 28  Left lateral flexion 22  Right rotation 62  Left rotation 40 *pain stops   (Blank rows = not tested)  UPPER EXTREMITY ROM: WFLs  UPPER EXTREMITY MMT: MMT Right eval Left eval  Shoulder flexion 5/5 4+/5  Shoulder extension    Shoulder abduction 5/5 4+/5  Shoulder adduction    Shoulder extension    Shoulder internal rotation    Shoulder external rotation    Middle trapezius    Lower trapezius    Elbow flexion 5/5 4+/5  Elbow extension 5/5 5/5  Wrist flexion 5/5 5/5  Wrist extension 5/5 5/5  Wrist ulnar deviation    Wrist radial deviation    Wrist pronation    Wrist supination    Grip strength     (  Blank rows = not tested)  CERVICAL SPECIAL TESTS: PROM flexion painful in lower cervical spine  [facility] Adult PT Treatment:                                                 DATE: 06/12/24 Therapeutic Exercise: Graylin Cassis and arrow for thoracic mobilization x 10 reps Prone On elbows with thoracic rotation opening R and L x 15 reps W scapular retraction with overhead reach x 10 reps In kneling lat stretch with chest drop at the mat x 30 seconds x 2 reps Supine Chin tuck with ball x 10 reps Lumbar rotation with thoracic opening over deflated ball  Manual Therapy: STM cervical, upper thoracic and mid thoracic spine for paraspinal mobilization Joint mobilization grade II-III  PA mobs mid thoracic and upper thoracic spine Modalities: Mechanical cervical traction x 10 minutes 14 #/ 9# intermittent    [facility] Adult PT Treatment:                                                DATE: 06/08/24 Therapeutic Exercise: Doorway pec stretch 3 positions 30 sec x 3 each position  Manual Therapy: Thoracic and lower cervical mobs patient sitting leaning forward head supported on pillows on table for PA and lateral mobs  Assisted pec/anterior chest stretch in sitting coregeous ball thoracic spine  Neuromuscular re-ed: Postural correction in sitting and standing with noodle  Chin tuck with noodle 10 sec x 10  Scap squeeze with noodle 10 sec x 10  Therapeutic Activity: Scap squeeze with ER with noodle 3- 5 sec x 10 W with noodle 3-5 sec x 10 t Standing thoracic extension with pec stretch x 10 Sitting resisted cervical sidebending 5 sec x 5 R/L  Self Care: Importance of posture and alignment in spine health Sitting with noodle  Working on exercises consistently    [facility] Adult PT Treatment:                                                DATE: 06/01/24 Therapeutic Exercise: HEP established-- see below Also tried wall open book thoracic spine Therapeutic Activity: Sleeping position on the L side using a towel roll under C spine (in pillow case) Patient able to maintain this position without worsening symptoms in therapy                                                                                                            PATIENT EDUCATION:  Education details: HEP Person educated: Patient Education method: Explanation, Demonstration, and Handouts Education comprehension: verbalized understanding, returned demonstration, and needs further education  HOME EXERCISE PROGRAM: Access  Code: [email or web address] URL: [email or web address] Date: 06/12/2024 Prepared by: Tawni Ferrier  Program Notes These should not increase pain. If one exercise irritates pain, hold that one until you return.  Exercises - Towel Roll Stretch  - 1 x daily - 5 x weekly - 1 sets - 10 reps - Supine Thoracic Mobilization Towel Roll Vertical with Arm Stretch  - 1 x daily - 5 x weekly - 1 sets - 2 reps - 60 -120 seconds hold - Seated Thoracic Lumbar Extension with Pectoralis Stretch  - 2 x daily - 7 x weekly - 1 sets - 5 reps - Standing with Forearms Thoracic Rotation  - 1 x daily - 5 x weekly - 1 sets - 10 reps - Standing Isometric Cervical Sidebending with Manual Resistance  - 1 x daily - 5 x weekly - 1 sets - 5 reps - 5seconds hold - Seated Cervical Sidebending Stretch  - 1 x daily - 7 x weekly - 1 sets - 2 reps - 30 seconds hold - Shoulder External Rotation and Scapular Retraction  - 3 x daily - 7 x weekly - 1 sets - 10 reps - 3-5 sec   hold - Standing Shoulder W at Wall  - 1-2 x daily - 7 x weekly - 1 sets - 10 reps - 3 sec  hold - Doorway Pec Stretch at 60 Degrees Abduction  - 3 x daily - 7 x weekly - 1 sets - 3 reps - Doorway Pec Stretch at 90 Degrees Abduction  - 3 x daily - 7 x weekly - 1 sets - 3 reps - 30 seconds  hold  ASSESSMENT:  CLINICAL IMPRESSION: The patient reports work is flaring pain and some of the exercises are helping. PT performed therapeutic exercises + mobilization with movement and STM + joint mobility to reduce pain. Patient reports 7/10 pain at end of session today and less radiating symptoms after traction. We modified HEP slightly to reduce # of  exercises and add upper trap stretch.   Eval: Patient is a 53 y.o. male who was seen today for physical therapy evaluation and treatment for neck pain.  He notes a h/o neck pain with a recent flare that is limiting sleeping and head turns to the L side. The patient responded well to initiation of treatment today with an improvement in L neck rotation from 40 degrees up to 55 degrees after isometrics. PT to continue to work to improve ROM, improve strength, and work on postural stabilization.   OBJECTIVE IMPAIRMENTS: decreased activity tolerance, decreased ROM, decreased strength, hypomobility, increased fascial restrictions, impaired flexibility, postural dysfunction, and pain.   GOALS: Goals reviewed with patient? Yes  SHORT TERM GOALS: Target date: 06/30/24  The patient will be indep with initial HEP Baseline: initiated at eval Goal status: INITIAL  2.  The patient will report being able to sleep through the night without pain.  Baseline:  Cannot sleep, especially when lying on the L side. Goal status: INITIAL  3.  The patient will have 4/10 pain noting improved tolerance for daily tasks. Baseline:  7/10 at rest Goal status: INITIAL   LONG TERM GOALS: Target date: 07/31/24  The patient will be indep with progression of HEP. Baseline:  initiated at eval Goal status: INITIAL  2.  The patient will improve NDI to 20% to demonstrate improved functional abilities. Baseline: 36% Goal status: INITIAL  3.  The patient will improve L cervical rotation to = that of R side. Baseline:  diminished to  L Goal status: INITIAL  4.  The patient will report pain < or equal to 2/10 to return to driving, lifting, and sleeping. Baseline:  7/10 Goal status: INITIAL  5.  Improve L shoulder strength to 5/5 for full strength for lifting tasks at work. Baseline:  see chart Goal status: INITIAL  PLAN:  PT FREQUENCY: 2x/week  PT DURATION: 8 weeks  PLANNED INTERVENTIONS: [id number]- PT  Re-evaluation, 97750- Physical Performance Testing, 97110-Therapeutic exercises, 97530- Therapeutic activity, 97112- Neuromuscular re-education, 97535- Self Care, [id number]- Manual therapy, [id number]- Electrical stimulation (unattended), [phone]- Traction (mechanical), Patient/Family education, Taping, Joint mobilization, Joint manipulation, Spinal mobilization, Cryotherapy, and Moist heat  PLAN FOR NEXT SESSION: check HEP, thoracic mobilization, Cervical thoracic Junction mobilization, scapular stabilization, neck ROM, UE strengthening. *Did traction help with L UE pain?    Demarie Hyneman, PT 06/12/2024, 2:12 PM

## 2024-06-15 ENCOUNTER — Encounter: Payer: Self-pay | Admitting: Rehabilitative and Restorative Service Providers"

## 2024-06-15 ENCOUNTER — Ambulatory Visit: Admitting: Rehabilitative and Restorative Service Providers"

## 2024-06-15 DIAGNOSIS — M6281 Muscle weakness (generalized): Secondary | ICD-10-CM

## 2024-06-15 DIAGNOSIS — M542 Cervicalgia: Secondary | ICD-10-CM

## 2024-06-15 DIAGNOSIS — R29818 Other symptoms and signs involving the nervous system: Secondary | ICD-10-CM

## 2024-06-15 DIAGNOSIS — M5030 Other cervical disc degeneration, unspecified cervical region: Secondary | ICD-10-CM | POA: Diagnosis not present

## 2024-06-15 NOTE — Therapy (Signed)
 OUTPATIENT PHYSICAL THERAPY CERVICAL TREATMENT  Patient Name: Gabriel Juarez MRN: [id number] DOB:March 25, 1971, 53 y.o., male Today's Date: 06/15/2024  END OF SESSION:  PT End of Session - 06/15/24 1154     Visit Number 4    Number of Visits 16    Date for PT Re-Evaluation 07/31/24    Authorization Type [employer] medicaid    PT Start Time 1152    PT Stop Time 1235    PT Time Calculation (min) 43 min    Activity Tolerance Patient tolerated treatment well    Behavior During Therapy [facility] for tasks assessed/performed           Past Medical History:  Diagnosis Date   Hypertension    Past Surgical History:  Procedure Laterality Date   NO PAST SURGERIES     Patient Active Problem List   Diagnosis Date Noted   Internal hemorrhoid, bleeding 05/18/2024   Rhinitis 05/31/2023   Low serum vitamin B12 08/31/2022   GERD (gastroesophageal reflux disease) 02/28/2022   Primary osteoarthritis of left knee 11/02/2021   Left groin pain 04/05/2020   OSA (obstructive sleep apnea) 02/03/2020   Chest trauma 06/27/2018   Hypersomnia 01/24/2017   Insomnia 10/13/2015   DDD (degenerative disc disease), cervical 01/04/2014   Axial low back pain 05/14/2013   Essential hypertension, benign 10/01/2012    PCP: Dorothyann Byars, MD REFERRING PROVIDER: Debby Petties, MD REFERRING DIAG: M50.30 (ICD-10-CM) - DDD (degenerative disc disease), cervical   THERAPY DIAG:  Cervicalgia  Muscle weakness (generalized)  Other symptoms and signs involving the nervous system  Rationale for Evaluation and Treatment: Rehabilitation  ONSET DATE: 05/22/24  SUBJECTIVE:                                                                                                                                                                                                        SUBJECTIVE STATEMENT: The patient has 5-6/10 pain. He has not had radiating pain since traction on Friday.    Eval: The patient reports he has  h/o neck pain and lower back pain x years. He reports he cannot turn to the L side and gets pain shooting down the L arm when he lays on it. It feels like I need it to pop one good time. Pt had no known injury for current exacerbation of pain. This pleasant 53 year old male returns, known cervical DDD, negative nerve conduction studies, his last epidural was in 2022. He has self discontinued his Lyrica  and Celebrex  and not surprisingly the pain recurred. Hand dominance: Right  PERTINENT HISTORY:  HTN  PAIN:  Are you having pain? Yes: NPRS scale: 8/10 Pain location: cervical spine into upper mid back Pain description: shooting at times down the arm, annoying like  a little stick Aggravating factors: turning to the L and laying on the L side Relieving factors: meds at times-- just started prednisone   PRECAUTIONS: None  WEIGHT BEARING RESTRICTIONS: No  FALLS:  Has patient fallen in last 6 months? 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 [name]  OBJECTIVE:  Note: Objective measures were completed at Evaluation unless otherwise noted.  DIAGNOSTIC FINDINGS:  01/07/21: IMPRESSION: 1. Multilevel cervical spondylosis, greatest at C5-6 where there is mild canal stenosis and moderate bilateral foraminal stenosis. 2. Moderate left foraminal stenosis at C3-4 and mild-moderate left foraminal stenosis at C4-5. 06/18/22: IMPRESSION: Moderate degenerative joint changes of C4 through C7, more advanced compared prior exam.   PATIENT SURVEYS:  NDI: 36% NECK DISABILITY INDEX  Date: 06/01/24 Score  Pain intensity 3 = The pain is fairly severe at the moment  2. Personal care (washing, dressing, etc.) 0 = I can look after myself normally without causing extra pain  3. Lifting 1 =  I can lift heavy weights but it gives extra pain  4. Reading 2 =  I can  read as much as I want with moderate pain in my neck  5. Headaches 0 = I have no headaches at all  6. Concentration 1 =  I can concentrate fully when I want to with slight difficulty   7. Work 2 = I can do most of my usual work, but no more  8. Driving 3 = I can't drive my car as long as I want because of moderate pain in my neck  9. Sleeping 4 = My sleep is greatly disturbed (3-5 hrs sleepless)   10. Recreation 2 = I am able to engage in most, but not all of my usual recreation activities because of   pain in my neck  Total 18/50  Minimum Detectable Change (90% confidence): 5 points or 10% points  SENSATION: Mild numbness L Ue  POSTURE: No Significant postural limitations  PALPATION: Tender to palpation of bilateral cervical spine musculature; tightness in bilat upper trap; hypomobility in thoracic spine for PA mobilization   CERVICAL ROM:  Active ROM A/PROM (deg) eval  Flexion 60  Extension 60  Right lateral flexion 28  Left lateral flexion 22  Right rotation 62  Left rotation 40 *pain stops   (Blank rows = not tested)  UPPER EXTREMITY ROM: WFLs  UPPER EXTREMITY MMT: MMT Right eval Left eval  Shoulder flexion 5/5 4+/5  Shoulder extension    Shoulder abduction 5/5 4+/5  Shoulder adduction    Shoulder extension    Shoulder internal rotation    Shoulder external rotation    Middle trapezius    Lower trapezius    Elbow flexion 5/5 4+/5  Elbow extension 5/5 5/5  Wrist flexion 5/5 5/5  Wrist extension 5/5 5/5  Wrist ulnar deviation    Wrist radial deviation    Wrist pronation    Wrist supination    Grip strength     (Blank rows = not tested)  CERVICAL SPECIAL TESTS: PROM flexion painful in lower cervical spine   [facility] Adult PT Treatment:  DATE: 06/15/24 Therapeutic Exercise: Seated Self SNAGS with extension and rotation R and L sides Sidelying  Thoracic open book with neck ROM R and L x 10 reps Supine Thoracic  mobilization over deflated ball  Tennis ball roll out Prone Goal post x 15 reps T x 15 reps Shoulder end range extension x 15 reps Thoracic extension with arms wide engaging chin tuck with extension x 5 reps Manual Therapy: Self mobilization using a tennis ball in upper thoracic spine-- added for home with 2 tennis balls STM parascapular and thoracic paraspinals Joint mobilization grade II-III upper thoracic spine  [facility] Adult PT Treatment:                                                DATE: 06/12/24 Therapeutic Exercise: Graylin Cassis and arrow for thoracic mobilization x 10 reps Prone On elbows with thoracic rotation opening R and L x 15 reps W scapular retraction with overhead reach x 10 reps In kneling lat stretch with chest drop at the mat x 30 seconds x 2 reps Supine Chin tuck with ball x 10 reps Lumbar rotation with thoracic opening over deflated ball  Manual Therapy: STM cervical, upper thoracic and mid thoracic spine for paraspinal mobilization Joint mobilization grade II-III  PA mobs mid thoracic and upper thoracic spine Modalities: Mechanical cervical traction x 10 minutes 14 #/ 9# intermittent    [facility] Adult PT Treatment:                                                DATE: 06/08/24 Therapeutic Exercise: Doorway pec stretch 3 positions 30 sec x 3 each position  Manual Therapy: Thoracic and lower cervical mobs patient sitting leaning forward head supported on pillows on table for PA and lateral mobs  Assisted pec/anterior chest stretch in sitting coregeous ball thoracic spine  Neuromuscular re-ed: Postural correction in sitting and standing with noodle  Chin tuck with noodle 10 sec x 10  Scap squeeze with noodle 10 sec x 10  Therapeutic Activity: Scap squeeze with ER with noodle 3- 5 sec x 10 W with noodle 3-5 sec x 10 t Standing thoracic extension with pec stretch x 10 Sitting resisted cervical sidebending 5 sec x 5 R/L  Self Care: Importance of posture and  alignment in spine health Sitting with noodle  Working on exercises consistently    [facility] Adult PT Treatment:                                                DATE: 06/01/24 Therapeutic Exercise: HEP established-- see below Also tried wall open book thoracic spine Therapeutic Activity: Sleeping position on the L side using a towel roll under C spine (in pillow case) Patient able to maintain this position without worsening symptoms in therapy  PATIENT EDUCATION:  Education details: HEP Person educated: Patient Education method: Programmer, multimedia, Facilities manager, and Handouts Education comprehension: verbalized understanding, returned demonstration, and needs further education  HOME EXERCISE PROGRAM: Access Code: [email or web address] URL: [email or web address] Date: 06/15/2024 Prepared by: Tawni Ferrier  Exercises - Seated Cervical Sidebending Stretch  - 1 x daily - 5 x weekly - 1 sets - 3 reps - 20 seconds hold - Doorway Pec Stretch at 90 Degrees Abduction  - 1 x daily - 5 x weekly - 1 sets - 3 reps - 20 seconds hold - Standing with Forearms Thoracic Rotation  - 1 x daily - 5 x weekly - 1 sets - 10 reps - Prone Shoulder Horizontal Abduction with Thumbs Up  - 1 x daily - 5 x weekly - 2 sets - 12 reps - Quadruped Full Range Thoracic Rotation with Reach  - 1 x daily - 5 x weekly - 1 sets - 5-8 reps - Supine Mid-Thoracic Spine Mobilization with Taped Tennis Balls Shoulder Flexion  - 1 x daily - 7 x weekly - 1 sets - 10 reps  ASSESSMENT:  CLINICAL IMPRESSION: The patient continues with pain more centralized in upper thoracic spine today-- no further radiating symptoms. He is tolerating exercises well in therapy and PT is adding further self mobilization activities. Traction did provide relief last week and PT included that in treatment today as well.   Eval: Patient is a 53 y.o. male  who was seen today for physical therapy evaluation and treatment for neck pain.  He notes a h/o neck pain with a recent flare that is limiting sleeping and head turns to the L side. The patient responded well to initiation of treatment today with an improvement in L neck rotation from 40 degrees up to 55 degrees after isometrics. PT to continue to work to improve ROM, improve strength, and work on postural stabilization.   OBJECTIVE IMPAIRMENTS: decreased activity tolerance, decreased ROM, decreased strength, hypomobility, increased fascial restrictions, impaired flexibility, postural dysfunction, and pain.   GOALS: Goals reviewed with patient? Yes  SHORT TERM GOALS: Target date: 06/30/24  The patient will be indep with initial HEP Baseline: initiated at eval Goal status: INITIAL  2.  The patient will report being able to sleep through the night without pain.  Baseline:  Cannot sleep, especially when lying on the L side. Goal status: PARTIALLY MET-- is sleeping better, but pain can still wake him 06/15/24  3.  The patient will have 4/10 pain noting improved tolerance for daily tasks. Baseline:  7/10 at rest Goal status: INITIAL   LONG TERM GOALS: Target date: 07/31/24  The patient will be indep with progression of HEP. Baseline:  initiated at eval Goal status: INITIAL  2.  The patient will improve NDI to 20% to demonstrate improved functional abilities. Baseline: 36% Goal status: INITIAL  3.  The patient will improve L cervical rotation to = that of R side. Baseline:  diminished to L Goal status: INITIAL  4.  The patient will report pain < or equal to 2/10 to return to driving, lifting, and sleeping. Baseline:  7/10 Goal status: INITIAL  5.  Improve L shoulder strength to 5/5 for full strength for lifting tasks at work. Baseline:  see chart Goal status: INITIAL  PLAN:  PT FREQUENCY: 2x/week  PT DURATION: 8 weeks  PLANNED INTERVENTIONS: [id number]- PT Re-evaluation, 97750-  Physical Performance Testing, 97110-Therapeutic exercises, 97530- Therapeutic activity, [id number]- Neuromuscular re-education, 97535- Self Care, [id number]- Manual therapy, [id number]- Electrical stimulation (unattended), [id number]-  Traction (mechanical), Patient/Family education, Taping, Joint mobilization, Joint manipulation, Spinal mobilization, Cryotherapy, and Moist heat  PLAN FOR NEXT SESSION: check HEP, thoracic mobilization, Cervical thoracic Junction mobilization, scapular stabilization, neck ROM, UE strengthening.  Tnia Anglada, PT 06/15/2024, 12:30 PM

## 2024-06-16 NOTE — Addendum Note (Signed)
 Addended by: Tevion Laforge D on: 06/16/2024 06:01 PM   Modules accepted: Orders

## 2024-06-18 ENCOUNTER — Encounter: Admitting: Rehabilitative and Restorative Service Providers"

## 2024-06-22 ENCOUNTER — Ambulatory Visit: Attending: Sports Medicine | Admitting: Rehabilitative and Restorative Service Providers"

## 2024-06-22 ENCOUNTER — Encounter: Payer: Self-pay | Admitting: Rehabilitative and Restorative Service Providers"

## 2024-06-22 DIAGNOSIS — R29818 Other symptoms and signs involving the nervous system: Secondary | ICD-10-CM | POA: Insufficient documentation

## 2024-06-22 DIAGNOSIS — M542 Cervicalgia: Secondary | ICD-10-CM | POA: Insufficient documentation

## 2024-06-22 DIAGNOSIS — M6281 Muscle weakness (generalized): Secondary | ICD-10-CM | POA: Insufficient documentation

## 2024-06-22 NOTE — Therapy (Signed)
 OUTPATIENT PHYSICAL THERAPY CERVICAL TREATMENT  Patient Name: Gabriel Juarez MRN: [id number] DOB:05-Jul-1971, 53 y.o., male Today's Date: 06/22/2024  END OF SESSION:  PT End of Session - 06/22/24 1108     Visit Number 5    Number of Visits 16    Date for PT Re-Evaluation 07/31/24    Authorization Type [employer] medicaid    PT Start Time 1107    PT Stop Time 1145    PT Time Calculation (min) 38 min    Activity Tolerance Patient tolerated treatment well    Behavior During Therapy [facility] for tasks assessed/performed          Past Medical History:  Diagnosis Date   Hypertension    Past Surgical History:  Procedure Laterality Date   NO PAST SURGERIES     Patient Active Problem List   Diagnosis Date Noted   Internal hemorrhoid, bleeding 05/18/2024   Rhinitis 05/31/2023   Low serum vitamin B12 08/31/2022   GERD (gastroesophageal reflux disease) 02/28/2022   Primary osteoarthritis of left knee 11/02/2021   Left groin pain 04/05/2020   OSA (obstructive sleep apnea) 02/03/2020   Chest trauma 06/27/2018   Hypersomnia 01/24/2017   Insomnia 10/13/2015   DDD (degenerative disc disease), cervical 01/04/2014   Axial low back pain 05/14/2013   Essential hypertension, benign 10/01/2012   PCP: Dorothyann Byars, MD REFERRING PROVIDER: Debby Petties, MD REFERRING DIAG: M50.30 (ICD-10-CM) - DDD (degenerative disc disease), cervical   THERAPY DIAG:  Cervicalgia  Muscle weakness (generalized)  Other symptoms and signs involving the nervous system  Rationale for Evaluation and Treatment: Rehabilitation  ONSET DATE: 05/22/24  SUBJECTIVE:                                                                                                                                                                                                        SUBJECTIVE STATEMENT: The patient reports pain is improving with mostly tightness sensation in upper central spine (thoracic region). He is not  getting radiating pain at this time.   Eval: The patient reports he has h/o neck pain and lower back pain x years. He reports he cannot turn to the L side and gets pain shooting down the L arm when he lays on it. It feels like I need it to pop one good time. Pt had no known injury for current exacerbation of pain. This pleasant 53 year old male returns, known cervical DDD, negative nerve conduction studies, his last epidural was in 2022. He has self discontinued his Lyrica  and Celebrex  and not surprisingly the pain recurred. Hand  dominance: Right  PERTINENT HISTORY:  HTN  PAIN:  Are you having pain? Yes: NPRS scale: 3-4/10 Pain location: cervical spine into upper mid back Pain description: shooting at times down the arm, annoying like  a little stick Aggravating factors: turning to the L and laying on the L side Relieving factors: meds at times-- just started prednisone   PRECAUTIONS: None  WEIGHT BEARING RESTRICTIONS: No  FALLS:  Has patient fallen in last 6 months? 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 [name]  OBJECTIVE:  Note: Objective measures were completed at Evaluation unless otherwise noted.  DIAGNOSTIC FINDINGS:  01/07/21: IMPRESSION: 1. Multilevel cervical spondylosis, greatest at C5-6 where there is mild canal stenosis and moderate bilateral foraminal stenosis. 2. Moderate left foraminal stenosis at C3-4 and mild-moderate left foraminal stenosis at C4-5. 06/18/22: IMPRESSION: Moderate degenerative joint changes of C4 through C7, more advanced compared prior exam.   PATIENT SURVEYS:  NDI: 36% NECK DISABILITY INDEX  Date: 06/01/24 Score  Pain intensity 3 = The pain is fairly severe at the moment  2. Personal care (washing, dressing, etc.) 0 = I can look after myself normally without causing extra pain  3. Lifting 1  =  I can lift heavy weights but it gives extra pain  4. Reading 2 =  I can read as much as I want with moderate pain in my neck  5. Headaches 0 = I have no headaches at all  6. Concentration 1 =  I can concentrate fully when I want to with slight difficulty   7. Work 2 = I can do most of my usual work, but no more  8. Driving 3 = I can't drive my car as long as I want because of moderate pain in my neck  9. Sleeping 4 = My sleep is greatly disturbed (3-5 hrs sleepless)   10. Recreation 2 = I am able to engage in most, but not all of my usual recreation activities because of   pain in my neck  Total 18/50  Minimum Detectable Change (90% confidence): 5 points or 10% points  SENSATION: Mild numbness L Ue  POSTURE: No Significant postural limitations  PALPATION: Tender to palpation of bilateral cervical spine musculature; tightness in bilat upper trap; hypomobility in thoracic spine for PA mobilization   CERVICAL ROM:  Active ROM A/PROM (deg) eval  Flexion 60  Extension 60  Right lateral flexion 28  Left lateral flexion 22  Right rotation 62  Left rotation 40 *pain stops   (Blank rows = not tested)  UPPER EXTREMITY ROM: WFLs  UPPER EXTREMITY MMT: MMT Right eval Left eval  Shoulder flexion 5/5 4+/5  Shoulder extension    Shoulder abduction 5/5 4+/5  Shoulder adduction    Shoulder extension    Shoulder internal rotation    Shoulder external rotation    Middle trapezius    Lower trapezius    Elbow flexion 5/5 4+/5  Elbow extension 5/5 5/5  Wrist flexion 5/5 5/5  Wrist extension 5/5 5/5  Wrist ulnar deviation    Wrist radial deviation    Wrist pronation    Wrist supination    Grip strength     (Blank rows = not tested)  CERVICAL SPECIAL TESTS: PROM flexion painful in lower cervical spine   [facility] Adult PT Treatment:  DATE: 06/22/24 Therapeutic Exercise: Prone Thoracic mobilization rotation with elbow lift R and  L Alternating shoulder flexion with lengthening through spine Quadriped Child's pose with arms overhead Thread the needle x 5 reps R and L 1/2 kneeling with thoracic opening using band Half kneeling  Thoracic opening red, then progressed to blue band towards raised leg x 10 reps each way Manual Therapy: Self mobilization foam roller for bilateral latissimus dorsi PA mobs thoracic spine and lateral mobs grade II-III STM erector spinae thoracic spine Seated mobilization with movement with arms folded and therapists rocking into thoracic extension-- this reveals tight lats-- therefore did foam rolling to release Modalities: Mechanical intermittent cervical traction x 10 minutes x 16#/11#    [facility] Adult PT Treatment:                                                DATE: 06/15/24 Therapeutic Exercise: Seated Self SNAGS with extension and rotation R and L sides Sidelying  Thoracic open book with neck ROM R and L x 10 reps Supine Thoracic mobilization over deflated ball  Tennis ball roll out Prone Goal post x 15 reps T x 15 reps Shoulder end range extension x 15 reps Thoracic extension with arms wide engaging chin tuck with extension x 5 reps Manual Therapy: Self mobilization using a tennis ball in upper thoracic spine-- added for home with 2 tennis balls STM parascapular and thoracic paraspinals Joint mobilization grade II-III upper thoracic spine  [facility] Adult PT Treatment:                                                DATE: 06/12/24 Therapeutic Exercise: Graylin Cassis and arrow for thoracic mobilization x 10 reps Prone On elbows with thoracic rotation opening R and L x 15 reps W scapular retraction with overhead reach x 10 reps In kneling lat stretch with chest drop at the mat x 30 seconds x 2 reps Supine Chin tuck with ball x 10 reps Lumbar rotation with thoracic opening over deflated ball  Manual Therapy: STM cervical, upper thoracic and mid thoracic spine for paraspinal  mobilization Joint mobilization grade II-III  PA mobs mid thoracic and upper thoracic spine Modalities: Mechanical cervical traction x 10 minutes 14 #/ 9# intermittent    [facility] Adult PT Treatment:                                                DATE: 06/08/24 Therapeutic Exercise: Doorway pec stretch 3 positions 30 sec x 3 each position  Manual Therapy: Thoracic and lower cervical mobs patient sitting leaning forward head supported on pillows on table for PA and lateral mobs  Assisted pec/anterior chest stretch in sitting coregeous ball thoracic spine  Neuromuscular re-ed: Postural correction in sitting and standing with noodle  Chin tuck with noodle 10 sec x 10  Scap squeeze with noodle 10 sec x 10  Therapeutic Activity: Scap squeeze with ER with noodle 3- 5 sec x 10 W with noodle 3-5 sec x 10 t Standing thoracic extension with pec stretch x 10 Sitting resisted cervical sidebending  5 sec x 5 R/L  Self Care: Importance of posture and alignment in spine health Sitting with noodle  Working on exercises consistently   PATIENT EDUCATION:  Education details: HEP Person educated: Patient Education method: Programmer, multimedia, Facilities manager, and Handouts Education comprehension: verbalized understanding, returned demonstration, and needs further education  HOME EXERCISE PROGRAM: Access Code: [email or web address] URL: [email or web address] Date: 06/22/2024 Prepared by: Tawni Ferrier  Exercises - Seated Cervical Sidebending Stretch  - 1 x daily - 5 x weekly - 1 sets - 3 reps - 20 seconds hold - Doorway Pec Stretch at 90 Degrees Abduction  - 1 x daily - 5 x weekly - 1 sets - 3 reps - 20 seconds hold - Standing with Forearms Thoracic Rotation  - 1 x daily - 5 x weekly - 1 sets - 10 reps - Prone Shoulder Horizontal Abduction with Thumbs Up  - 1 x daily - 5 x weekly - 2 sets - 12 reps - Quadruped Full Range Thoracic Rotation with Reach  - 1 x daily - 5 x weekly - 1 sets - 5-8 reps - Supine  Mid-Thoracic Spine Mobilization with Taped Tennis Balls Shoulder Flexion  - 1 x daily - 7 x weekly - 1 sets - 10 reps - Half-Kneeling Thoracic Rotation  - 2 x daily - 7 x weekly - 1 sets - 10 reps - Prone Chest Stretch on Chair  - 2 x daily - 7 x weekly - 1 sets - 10 reps  ASSESSMENT:  CLINIICAL IMPRESSION: The patient is indep with HEP. He is noting reduced pain overall meeting LTG # 3. PT progressing thoracic stabilization +mobilization and continuing traction. Plan to continue to progress to LTGs.   Eval: Patient is a 53 y.o. male who was seen today for physical therapy evaluation and treatment for neck pain.  He notes a h/o neck pain with a recent flare that is limiting sleeping and head turns to the L side. The patient responded well to initiation of treatment today with an improvement in L neck rotation from 40 degrees up to 55 degrees after isometrics. PT to continue to work to improve ROM, improve strength, and work on postural stabilization.   OBJECTIVE IMPAIRMENTS: decreased activity tolerance, decreased ROM, decreased strength, hypomobility, increased fascial restrictions, impaired flexibility, postural dysfunction, and pain.   GOALS: Goals reviewed with patient? Yes  SHORT TERM GOALS: Target date: 06/30/24  The patient will be indep with initial HEP Baseline: initiated at eval Goal status: MET  2.  The patient will report being able to sleep through the night without pain.  Baseline:  Cannot sleep, especially when lying on the L side. Goal status: PARTIALLY MET-- is sleeping better, but pain can still wake him 06/15/24  3.  The patient will have 4/10 pain noting improved tolerance for daily tasks. Baseline:  7/10 at rest Goal status: MET  LONG TERM GOALS: Target date: 07/31/24  The patient will be indep with progression of HEP. Baseline:  initiated at eval Goal status: INITIAL  2.  The patient will improve NDI to 20% to demonstrate improved functional  abilities. Baseline: 36% Goal status: INITIAL  3.  The patient will improve L cervical rotation to = that of R side. Baseline:  diminished to L Goal status: INITIAL  4.  The patient will report pain < or equal to 2/10 to return to driving, lifting, and sleeping. Baseline:  7/10 Goal status: INITIAL  5.  Improve L shoulder strength to 5/5 for full strength for lifting  tasks at work. Baseline:  see chart Goal status: INITIAL  PLAN:  PT FREQUENCY: 2x/week  PT DURATION: 8 weeks  PLANNED INTERVENTIONS: 97164- PT Re-evaluation, 97750- Physical Performance Testing, 97110-Therapeutic exercises, 97530- Therapeutic activity, 97112- Neuromuscular re-education, 97535- Self Care, [id number]- Manual therapy, [id number]- Electrical stimulation (unattended), [phone]- Traction (mechanical), Patient/Family education, Taping, Joint mobilization, Joint manipulation, Spinal mobilization, Cryotherapy, and Moist heat  PLAN FOR NEXT SESSION: check HEP, thoracic mobilization, Cervical thoracic Junction mobilization, scapular stabilization, neck ROM, UE strengthening.    Geoffry Bannister, PT 06/22/2024, 3:22 PM

## 2024-06-24 ENCOUNTER — Ambulatory Visit: Admitting: Physical Therapy

## 2024-06-24 ENCOUNTER — Encounter: Payer: Self-pay | Admitting: Physical Therapy

## 2024-06-24 DIAGNOSIS — M542 Cervicalgia: Secondary | ICD-10-CM | POA: Diagnosis not present

## 2024-06-24 DIAGNOSIS — M6281 Muscle weakness (generalized): Secondary | ICD-10-CM

## 2024-06-24 DIAGNOSIS — R29818 Other symptoms and signs involving the nervous system: Secondary | ICD-10-CM

## 2024-06-24 NOTE — Therapy (Signed)
 OUTPATIENT PHYSICAL THERAPY CERVICAL TREATMENT  Patient Name: Gabriel Juarez MRN: [id number] DOB:1971/01/22, 53 y.o., male Today's Date: 06/24/2024  END OF SESSION:  PT End of Session - 06/24/24 1214     Visit Number 6    Number of Visits 16    Date for PT Re-Evaluation 07/31/24    Authorization Type [employer] medicaid    PT Start Time 1145    PT Stop Time 1225    PT Time Calculation (min) 40 min    Activity Tolerance Patient tolerated treatment well    Behavior During Therapy [facility] for tasks assessed/performed           Past Medical History:  Diagnosis Date   Hypertension    Past Surgical History:  Procedure Laterality Date   NO PAST SURGERIES     Patient Active Problem List   Diagnosis Date Noted   Internal hemorrhoid, bleeding 05/18/2024   Rhinitis 05/31/2023   Low serum vitamin B12 08/31/2022   GERD (gastroesophageal reflux disease) 02/28/2022   Primary osteoarthritis of left knee 11/02/2021   Left groin pain 04/05/2020   OSA (obstructive sleep apnea) 02/03/2020   Chest trauma 06/27/2018   Hypersomnia 01/24/2017   Insomnia 10/13/2015   DDD (degenerative disc disease), cervical 01/04/2014   Axial low back pain 05/14/2013   Essential hypertension, benign 10/01/2012   PCP: Dorothyann Byars, MD REFERRING PROVIDER: Debby Petties, MD REFERRING DIAG: M50.30 (ICD-10-CM) - DDD (degenerative disc disease), cervical   THERAPY DIAG:  Cervicalgia  Muscle weakness (generalized)  Other symptoms and signs involving the nervous system  Rationale for Evaluation and Treatment: Rehabilitation  ONSET DATE: 05/22/24  SUBJECTIVE:                                                                                                                                                                                                        SUBJECTIVE STATEMENT: The patient reports pain is improving with mostly tightness sensation in upper central spine (thoracic region). He is not  getting radiating pain at this time.   Eval: The patient reports he has h/o neck pain and lower back pain x years. He reports he cannot turn to the L side and gets pain shooting down the L arm when he lays on it. It feels like I need it to pop one good time. Pt had no known injury for current exacerbation of pain. This pleasant 53 year old male returns, known cervical DDD, negative nerve conduction studies, his last epidural was in 2022. He has self discontinued his Lyrica  and Celebrex  and not surprisingly the pain recurred.  Hand dominance: Right  PERTINENT HISTORY:  HTN  PAIN:  Are you having pain? Yes: NPRS scale: 3-4/10 Pain location: cervical spine into upper mid back Pain description: shooting at times down the arm, annoying like  a little stick Aggravating factors: turning to the L and laying on the L side Relieving factors: meds at times-- just started prednisone   PRECAUTIONS: None  WEIGHT BEARING RESTRICTIONS: No  FALLS:  Has patient fallen in last 6 months? 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 [name]  OBJECTIVE:  Note: Objective measures were completed at Evaluation unless otherwise noted.  DIAGNOSTIC FINDINGS:  01/07/21: IMPRESSION: 1. Multilevel cervical spondylosis, greatest at C5-6 where there is mild canal stenosis and moderate bilateral foraminal stenosis. 2. Moderate left foraminal stenosis at C3-4 and mild-moderate left foraminal stenosis at C4-5. 06/18/22: IMPRESSION: Moderate degenerative joint changes of C4 through C7, more advanced compared prior exam.   PATIENT SURVEYS:  NDI: 36% NECK DISABILITY INDEX  Date: 06/01/24 Score  Pain intensity 3 = The pain is fairly severe at the moment  2. Personal care (washing, dressing, etc.) 0 = I can look after myself normally without causing extra pain  3. Lifting 1  =  I can lift heavy weights but it gives extra pain  4. Reading 2 =  I can read as much as I want with moderate pain in my neck  5. Headaches 0 = I have no headaches at all  6. Concentration 1 =  I can concentrate fully when I want to with slight difficulty   7. Work 2 = I can do most of my usual work, but no more  8. Driving 3 = I can't drive my car as long as I want because of moderate pain in my neck  9. Sleeping 4 = My sleep is greatly disturbed (3-5 hrs sleepless)   10. Recreation 2 = I am able to engage in most, but not all of my usual recreation activities because of   pain in my neck  Total 18/50  Minimum Detectable Change (90% confidence): 5 points or 10% points  SENSATION: Mild numbness L Ue  POSTURE: No Significant postural limitations  PALPATION: Tender to palpation of bilateral cervical spine musculature; tightness in bilat upper trap; hypomobility in thoracic spine for PA mobilization   CERVICAL ROM:  Active ROM A/PROM (deg) eval  Flexion 60  Extension 60  Right lateral flexion 28  Left lateral flexion 22  Right rotation 62  Left rotation 40 *pain stops   (Blank rows = not tested)  UPPER EXTREMITY ROM: WFLs  UPPER EXTREMITY MMT: MMT Right eval Left eval  Shoulder flexion 5/5 4+/5  Shoulder extension    Shoulder abduction 5/5 4+/5  Shoulder adduction    Shoulder extension    Shoulder internal rotation    Shoulder external rotation    Middle trapezius    Lower trapezius    Elbow flexion 5/5 4+/5  Elbow extension 5/5 5/5  Wrist flexion 5/5 5/5  Wrist extension 5/5 5/5  Wrist ulnar deviation    Wrist radial deviation    Wrist pronation    Wrist supination    Grip strength     (Blank rows = not tested)  CERVICAL SPECIAL TESTS: PROM flexion painful in lower cervical spine   [facility] Adult PT Treatment:  DATE: 06/24/24 Therapeutic Exercise: UBE for active warm up x 2 min alt fwd/bkwd Thoracic extension with  forearms on raised table Prone Thoracic mobilization rotation Swimmers for thoracic mobilization Quadruped Thread the needle x 10 bilat Childs pose 2 x 30 sec Half kneeling Thoracic opening blue TB x 10 bilat Standing Foam roll for lat stretch Manual Therapy: Grade 2-3 CPAs and UPAs thoracic spine STM thoracic paraspinals  Modalities: Mechanical intermittent cervical traction x 10 minutes x 16#/11#    [facility] Adult PT Treatment:                                                DATE: 06/22/24 Therapeutic Exercise: Prone Thoracic mobilization rotation with elbow lift R and L Alternating shoulder flexion with lengthening through spine Quadriped Child's pose with arms overhead Thread the needle x 5 reps R and L 1/2 kneeling with thoracic opening using band Half kneeling  Thoracic opening red, then progressed to blue band towards raised leg x 10 reps each way Manual Therapy: Self mobilization foam roller for bilateral latissimus dorsi PA mobs thoracic spine and lateral mobs grade II-III STM erector spinae thoracic spine Seated mobilization with movement with arms folded and therapists rocking into thoracic extension-- this reveals tight lats-- therefore did foam rolling to release Modalities: Mechanical intermittent cervical traction x 10 minutes x 16#/11#    [facility] Adult PT Treatment:                                                DATE: 06/15/24 Therapeutic Exercise: Seated Self SNAGS with extension and rotation R and L sides Sidelying  Thoracic open book with neck ROM R and L x 10 reps Supine Thoracic mobilization over deflated ball  Tennis ball roll out Prone Goal post x 15 reps T x 15 reps Shoulder end range extension x 15 reps Thoracic extension with arms wide engaging chin tuck with extension x 5 reps Manual Therapy: Self mobilization using a tennis ball in upper thoracic spine-- added for home with 2 tennis balls STM parascapular and thoracic paraspinals Joint  mobilization grade II-III upper thoracic spine  [facility] Adult PT Treatment:                                                DATE: 06/12/24 Therapeutic Exercise: Graylin Cassis and arrow for thoracic mobilization x 10 reps Prone On elbows with thoracic rotation opening R and L x 15 reps W scapular retraction with overhead reach x 10 reps In kneling lat stretch with chest drop at the mat x 30 seconds x 2 reps Supine Chin tuck with ball x 10 reps Lumbar rotation with thoracic opening over deflated ball  Manual Therapy: STM cervical, upper thoracic and mid thoracic spine for paraspinal mobilization Joint mobilization grade II-III  PA mobs mid thoracic and upper thoracic spine Modalities: Mechanical cervical traction x 10 minutes 14 #/ 9# intermittent   PATIENT EDUCATION:  Education details: HEP Person educated: Patient Education method: Programmer, multimedia, Facilities manager, and Handouts Education comprehension: verbalized understanding, returned demonstration, and needs further education  HOME EXERCISE PROGRAM: Access  Code: [email or web address] URL: [email or web address] Date: 06/22/2024 Prepared by: Tawni Ferrier  Exercises - Seated Cervical Sidebending Stretch  - 1 x daily - 5 x weekly - 1 sets - 3 reps - 20 seconds hold - Doorway Pec Stretch at 90 Degrees Abduction  - 1 x daily - 5 x weekly - 1 sets - 3 reps - 20 seconds hold - Standing with Forearms Thoracic Rotation  - 1 x daily - 5 x weekly - 1 sets - 10 reps - Prone Shoulder Horizontal Abduction with Thumbs Up  - 1 x daily - 5 x weekly - 2 sets - 12 reps - Quadruped Full Range Thoracic Rotation with Reach  - 1 x daily - 5 x weekly - 1 sets - 5-8 reps - Supine Mid-Thoracic Spine Mobilization with Taped Tennis Balls Shoulder Flexion  - 1 x daily - 7 x weekly - 1 sets - 10 reps - Half-Kneeling Thoracic Rotation  - 2 x daily - 7 x weekly - 1 sets - 10 reps - Prone Chest Stretch on Chair  - 2 x daily - 7 x weekly - 1 sets - 10  reps  ASSESSMENT:  CLINIICAL IMPRESSION: Continued to progress thoracic mobility. Pt with improving thoracic spine motion noted during standing lat stretch after manual work. Continued traction as pt feels good relief with this modality.   Eval: Patient is a 53 y.o. male who was seen today for physical therapy evaluation and treatment for neck pain.  He notes a h/o neck pain with a recent flare that is limiting sleeping and head turns to the L side. The patient responded well to initiation of treatment today with an improvement in L neck rotation from 40 degrees up to 55 degrees after isometrics. PT to continue to work to improve ROM, improve strength, and work on postural stabilization.   OBJECTIVE IMPAIRMENTS: decreased activity tolerance, decreased ROM, decreased strength, hypomobility, increased fascial restrictions, impaired flexibility, postural dysfunction, and pain.   GOALS: Goals reviewed with patient? Yes  SHORT TERM GOALS: Target date: 06/30/24  The patient will be indep with initial HEP Baseline: initiated at eval Goal status: MET  2.  The patient will report being able to sleep through the night without pain.  Baseline:  Cannot sleep, especially when lying on the L side. Goal status: PARTIALLY MET-- is sleeping better, but pain can still wake him 06/15/24  3.  The patient will have 4/10 pain noting improved tolerance for daily tasks. Baseline:  7/10 at rest Goal status: MET  LONG TERM GOALS: Target date: 07/31/24  The patient will be indep with progression of HEP. Baseline:  initiated at eval Goal status: INITIAL  2.  The patient will improve NDI to 20% to demonstrate improved functional abilities. Baseline: 36% Goal status: INITIAL  3.  The patient will improve L cervical rotation to = that of R side. Baseline:  diminished to L Goal status: INITIAL  4.  The patient will report pain < or equal to 2/10 to return to driving, lifting, and sleeping. Baseline:   7/10 Goal status: INITIAL  5.  Improve L shoulder strength to 5/5 for full strength for lifting tasks at work. Baseline:  see chart Goal status: INITIAL  PLAN:  PT FREQUENCY: 2x/week  PT DURATION: 8 weeks  PLANNED INTERVENTIONS: [id number]- PT Re-evaluation, 97750- Physical Performance Testing, 97110-Therapeutic exercises, 97530- Therapeutic activity, [id number]- Neuromuscular re-education, 97535- Self Care, [id number]- Manual therapy, [id number]- Electrical stimulation (unattended), [id number]- Traction (mechanical), Patient/Family education, Taping, Joint mobilization, Joint  manipulation, Spinal mobilization, Cryotherapy, and Moist heat  PLAN FOR NEXT SESSION: check HEP, thoracic mobilization, Cervical thoracic Junction mobilization, scapular stabilization, neck ROM, UE strengthening.    Abegail Kloeppel, PT 06/24/2024, 12:15 PM

## 2024-06-25 ENCOUNTER — Encounter: Payer: Self-pay | Admitting: Family Medicine

## 2024-06-25 DIAGNOSIS — F339 Major depressive disorder, recurrent, unspecified: Secondary | ICD-10-CM

## 2024-06-26 NOTE — Telephone Encounter (Signed)
 Okay, lets get him scheduled next week with me either in person or virtually what ever he prefers so that we can talk about medication options.

## 2024-06-26 NOTE — Telephone Encounter (Signed)
 Called left patient a message to call back and schedule an appointment for Monday July 14th at 11:30pm for medication

## 2024-06-28 ENCOUNTER — Other Ambulatory Visit: Payer: Self-pay | Admitting: Medical Genetics

## 2024-06-29 ENCOUNTER — Ambulatory Visit

## 2024-06-29 DIAGNOSIS — M542 Cervicalgia: Secondary | ICD-10-CM | POA: Diagnosis not present

## 2024-06-29 DIAGNOSIS — R29818 Other symptoms and signs involving the nervous system: Secondary | ICD-10-CM

## 2024-06-29 DIAGNOSIS — M6281 Muscle weakness (generalized): Secondary | ICD-10-CM

## 2024-06-29 NOTE — Therapy (Addendum)
 OUTPATIENT PHYSICAL THERAPY CERVICAL TREATMENT and D/C SUMMARY  Patient Name: Gabriel Juarez MRN: [id number] DOB:31-Mar-1971, 53 y.o., male Today's Date: 06/29/2024   PHYSICAL THERAPY DISCHARGE SUMMARY  Visits from Start of Care: 7  Current functional level related to goals / functional outcomes: See below-- patient met STGs. LTGs not assessed as patient did not return   Remaining deficits: See note below for last known patient status   Education / Equipment: HEP   Patient agrees to discharge. Patient goals were partially met. Patient is being discharged due to not returning since the last visit.  END OF SESSION:  PT End of Session - 06/29/24 0755     Visit Number 7    Number of Visits 16    Date for PT Re-Evaluation 07/31/24    Authorization Type [employer] medicaid    PT Start Time 0800    PT Stop Time 0845    PT Time Calculation (min) 45 min    Activity Tolerance Patient tolerated treatment well    Behavior During Therapy [facility] for tasks assessed/performed         Past Medical History:  Diagnosis Date   Hypertension    Past Surgical History:  Procedure Laterality Date   NO PAST SURGERIES     Patient Active Problem List   Diagnosis Date Noted   Internal hemorrhoid, bleeding 05/18/2024   Rhinitis 05/31/2023   Low serum vitamin B12 08/31/2022   GERD (gastroesophageal reflux disease) 02/28/2022   Primary osteoarthritis of left knee 11/02/2021   Left groin pain 04/05/2020   OSA (obstructive sleep apnea) 02/03/2020   Chest trauma 06/27/2018   Hypersomnia 01/24/2017   Insomnia 10/13/2015   DDD (degenerative disc disease), cervical 01/04/2014   Axial low back pain 05/14/2013   Essential hypertension, benign 10/01/2012   PCP: Dorothyann Byars, MD REFERRING PROVIDER: Debby Petties, MD REFERRING DIAG: M50.30 (ICD-10-CM) - DDD (degenerative disc disease), cervical   THERAPY DIAG:  Cervicalgia  Muscle weakness (generalized)  Other symptoms and signs  involving the nervous system  Rationale for Evaluation and Treatment: Rehabilitation  ONSET DATE: 05/22/24  SUBJECTIVE:                                                                                                                                                                                                        SUBJECTIVE STATEMENT: Patient reports he continues to have tightness in upper thoracic spine; states he has a pain at posterior shoulder/base of neck but no radiation down arm. Patient states he feels more mobility when doing band exercises.   Eval:  The patient reports he has h/o neck pain and lower back pain x years. He reports he cannot turn to the L side and gets pain shooting down the L arm when he lays on it. It feels like I need it to pop one good time. Pt had no known injury for current exacerbation of pain. This pleasant 53 year old male returns, known cervical DDD, negative nerve conduction studies, his last epidural was in 2022. He has self discontinued his Lyrica  and Celebrex  and not surprisingly the pain recurred. Hand dominance: Right  PERTINENT HISTORY:  HTN  PAIN:  Are you having pain? Yes: NPRS scale: 3-4/10 Pain location: cervical spine into upper mid back Pain description: shooting at times down the arm, annoying like  a little stick Aggravating factors: turning to the L and laying on the L side Relieving factors: meds at times-- just started prednisone   PRECAUTIONS: None  WEIGHT BEARING RESTRICTIONS: No  FALLS:  Has patient fallen in last 6 months? 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 [name]  OBJECTIVE:  Note: Objective measures were completed at Evaluation unless otherwise noted.  DIAGNOSTIC FINDINGS:  01/07/21: IMPRESSION: 1. Multilevel cervical spondylosis, greatest at C5-6 where there  is mild canal stenosis and moderate bilateral foraminal stenosis. 2. Moderate left foraminal stenosis at C3-4 and mild-moderate left foraminal stenosis at C4-5. 06/18/22: IMPRESSION: Moderate degenerative joint changes of C4 through C7, more advanced compared prior exam.   PATIENT SURVEYS:  NDI: 36% NECK DISABILITY INDEX  Date: 06/01/24 Score  Pain intensity 3 = The pain is fairly severe at the moment  2. Personal care (washing, dressing, etc.) 0 = I can look after myself normally without causing extra pain  3. Lifting 1 =  I can lift heavy weights but it gives extra pain  4. Reading 2 =  I can read as much as I want with moderate pain in my neck  5. Headaches 0 = I have no headaches at all  6. Concentration 1 =  I can concentrate fully when I want to with slight difficulty   7. Work 2 = I can do most of my usual work, but no more  8. Driving 3 = I can't drive my car as long as I want because of moderate pain in my neck  9. Sleeping 4 = My sleep is greatly disturbed (3-5 hrs sleepless)   10. Recreation 2 = I am able to engage in most, but not all of my usual recreation activities because of   pain in my neck  Total 18/50  Minimum Detectable Change (90% confidence): 5 points or 10% points  SENSATION: Mild numbness L Ue  POSTURE: No Significant postural limitations  PALPATION: Tender to palpation of bilateral cervical spine musculature; tightness in bilat upper trap; hypomobility in thoracic spine for PA mobilization   CERVICAL ROM:  Active ROM A/PROM (deg) eval  Flexion 60  Extension 60  Right lateral flexion 28  Left lateral flexion 22  Right rotation 62  Left rotation 40 *pain stops   (Blank rows = not tested)  UPPER EXTREMITY ROM: WFLs  UPPER EXTREMITY MMT: MMT Right eval Left eval  Shoulder flexion 5/5 4+/5  Shoulder extension    Shoulder abduction 5/5 4+/5  Shoulder adduction    Shoulder extension    Shoulder internal rotation    Shoulder  external rotation     Middle trapezius    Lower trapezius    Elbow flexion 5/5 4+/5  Elbow extension 5/5 5/5  Wrist flexion 5/5 5/5  Wrist extension 5/5 5/5  Wrist ulnar deviation    Wrist radial deviation    Wrist pronation    Wrist supination    Grip strength     (Blank rows = not tested)  CERVICAL SPECIAL TESTS: PROM flexion painful in lower cervical spine   [facility] Adult PT Treatment:                                                DATE: 06/29/2024 Therapeutic Exercise: Eagle arm yoga stretch pose for shoulder tightness UBE L2 for active warm up x 2 min alt fwd/bkwd Thoracic extension with forearms on raised table Wray Lovett pose <--> quadruped S/L open books x10 (bil) Quadruped thoracic extension stretch with elbows propped on yoga blocks 3x10 Drawing the bow at the wall + RTB x10 (bil) Neuromuscular re-ed: Quadruped cat/cow with RTB for thoracic mobility focus Serratus wall slides with foam roller + thoracic extension tactile cues Modalities: Mechanical intermittent cervical traction 16# max, 11# min x 10 minutes     [facility] Adult PT Treatment:                                                DATE: 06/24/24 Therapeutic Exercise: UBE for active warm up x 2 min alt fwd/bkwd Thoracic extension with forearms on raised table Prone Thoracic mobilization rotation Swimmers for thoracic mobilization Quadruped Thread the needle x 10 bilat Childs pose 2 x 30 sec Half kneeling Thoracic opening blue TB x 10 bilat Standing Foam roll for lat stretch Manual Therapy: Grade 2-3 CPAs and UPAs thoracic spine STM thoracic paraspinals Modalities: Mechanical intermittent cervical traction x 10 minutes x 16#/11#     [facility] Adult PT Treatment:                                                DATE: 06/22/24 Therapeutic Exercise: Prone Thoracic mobilization rotation with elbow lift R and L Alternating shoulder flexion with lengthening through spine Quadriped Child's pose with arms overhead Thread the  needle x 5 reps R and L 1/2 kneeling with thoracic opening using band Half kneeling  Thoracic opening red, then progressed to blue band towards raised leg x 10 reps each way Manual Therapy: Self mobilization foam roller for bilateral latissimus dorsi PA mobs thoracic spine and lateral mobs grade II-III STM erector spinae thoracic spine Seated mobilization with movement with arms folded and therapists rocking into thoracic extension-- this reveals tight lats-- therefore did foam rolling to release Modalities: Mechanical intermittent cervical traction x 10 minutes x 16#/11#     PATIENT EDUCATION:  Education details: HEP Person educated: Patient Education method: Programmer, Multimedia, Facilities Manager, and Handouts Education comprehension: verbalized understanding, returned demonstration, and needs further education  HOME EXERCISE PROGRAM: Access Code: [email or web address] URL: [email or web address] Date: 06/22/2024 Prepared by: Tawni Ferrier  Exercises - Seated Cervical Sidebending Stretch  - 1 x daily - 5 x weekly - 1 sets -  3 reps - 20 seconds hold - Doorway Pec Stretch at 90 Degrees Abduction  - 1 x daily - 5 x weekly - 1 sets - 3 reps - 20 seconds hold - Standing with Forearms Thoracic Rotation  - 1 x daily - 5 x weekly - 1 sets - 10 reps - Prone Shoulder Horizontal Abduction with Thumbs Up  - 1 x daily - 5 x weekly - 2 sets - 12 reps - Quadruped Full Range Thoracic Rotation with Reach  - 1 x daily - 5 x weekly - 1 sets - 5-8 reps - Supine Mid-Thoracic Spine Mobilization with Taped Tennis Balls Shoulder Flexion  - 1 x daily - 7 x weekly - 1 sets - 10 reps - Half-Kneeling Thoracic Rotation  - 2 x daily - 7 x weekly - 1 sets - 10 reps - Prone Chest Stretch on Chair  - 2 x daily - 7 x weekly - 1 sets - 10 reps  ASSESSMENT:  CLINIICAL IMPRESSION: Continued thoracic mobility focused exercises and stretches; provided quadruped and standing variations of thoracic rotation and extension to  progress exercises. Noted tendency to compensate with lumbar hyperextension during standing and supine exercises due to restricted thoracic mobility; tactile cues improved postural mechanics.   Eval: Patient is a 53 y.o. male who was seen today for physical therapy evaluation and treatment for neck pain.  He notes a h/o neck pain with a recent flare that is limiting sleeping and head turns to the L side. The patient responded well to initiation of treatment today with an improvement in L neck rotation from 40 degrees up to 55 degrees after isometrics. PT to continue to work to improve ROM, improve strength, and work on postural stabilization.   OBJECTIVE IMPAIRMENTS: decreased activity tolerance, decreased ROM, decreased strength, hypomobility, increased fascial restrictions, impaired flexibility, postural dysfunction, and pain.   GOALS: Goals reviewed with patient? Yes  SHORT TERM GOALS: Target date: 06/30/24  The patient will be indep with initial HEP Baseline: initiated at eval Goal status: MET  2.  The patient will report being able to sleep through the night without pain.  Baseline:  Cannot sleep, especially when lying on the L side. Goal status: PARTIALLY MET-- is sleeping better, but pain can still wake him 06/15/24  3.  The patient will have 4/10 pain noting improved tolerance for daily tasks. Baseline:  7/10 at rest Goal status: MET  LONG TERM GOALS: Target date: 07/31/24  The patient will be indep with progression of HEP. Baseline:  initiated at eval Goal status: INITIAL  2.  The patient will improve NDI to 20% to demonstrate improved functional abilities. Baseline: 36% Goal status: INITIAL  3.  The patient will improve L cervical rotation to = that of R side. Baseline:  diminished to L Goal status: INITIAL  4.  The patient will report pain < or equal to 2/10 to return to driving, lifting, and sleeping. Baseline:  7/10 Goal status: INITIAL  5.  Improve L shoulder  strength to 5/5 for full strength for lifting tasks at work. Baseline:  see chart Goal status: INITIAL  PLAN:  PT FREQUENCY: 2x/week  PT DURATION: 8 weeks  PLANNED INTERVENTIONS: 97164- PT Re-evaluation, 97750- Physical Performance Testing, 97110-Therapeutic exercises, 97530- Therapeutic activity, [id number]- Neuromuscular re-education, 97535- Self Care, [id number]- Manual therapy, [id number]- Electrical stimulation (unattended), [phone]- Traction (mechanical), Patient/Family education, Taping, Joint mobilization, Joint manipulation, Spinal mobilization, Cryotherapy, and Moist heat  PLAN FOR NEXT SESSION: check HEP, thoracic mobilization, Cervical  thoracic Junction mobilization, scapular stabilization, neck ROM, UE strengthening.    Lamarr GORMAN Price, PTA 06/29/2024, 8:37 AM  Darice Conine, PT,DPT12/16/20252:42 PM

## 2024-06-29 NOTE — Telephone Encounter (Signed)
 Patient called stating that he could not accept the appt availability for today, July 14th at 11:30AM. He states that he has an eye appt at the exact same time. Provided him with the next availability for Dr. Alvan on August 25th and gave option of in person or video. Patient accepted August 25th via video visit.

## 2024-07-01 ENCOUNTER — Other Ambulatory Visit

## 2024-07-03 ENCOUNTER — Ambulatory Visit: Admitting: Sports Medicine

## 2024-07-03 ENCOUNTER — Encounter

## 2024-07-06 ENCOUNTER — Other Ambulatory Visit: Payer: Self-pay | Admitting: Family Medicine

## 2024-07-06 ENCOUNTER — Ambulatory Visit (INDEPENDENT_AMBULATORY_CARE_PROVIDER_SITE_OTHER): Admitting: Family Medicine

## 2024-07-06 ENCOUNTER — Ambulatory Visit: Admitting: Physical Therapy

## 2024-07-06 VITALS — BP 117/71 | HR 102 | Ht 67.0 in | Wt 181.0 lb

## 2024-07-06 DIAGNOSIS — F339 Major depressive disorder, recurrent, unspecified: Secondary | ICD-10-CM

## 2024-07-06 DIAGNOSIS — R0789 Other chest pain: Secondary | ICD-10-CM

## 2024-07-06 DIAGNOSIS — F419 Anxiety disorder, unspecified: Secondary | ICD-10-CM | POA: Insufficient documentation

## 2024-07-06 DIAGNOSIS — I10 Essential (primary) hypertension: Secondary | ICD-10-CM

## 2024-07-06 MED ORDER — SERTRALINE HCL 25 MG PO TABS: ORAL_TABLET | ORAL | 0 refills | Status: DC

## 2024-07-06 NOTE — Assessment & Plan Note (Addendum)
 He did not really want to go into details in particular he just said he has a lot of different stressors to the point where he came in today because he felt like he needed to do something about it.  PHQ-9 score of 17 GAD-7 score of 19.  Discussed options he is open to trying a medication and open to working with a therapist or counseling.  He is okay with starting sertraline  we discussed the medication and potential side effects and how long it takes for the medication to work see him back in about 3 to 4 weeks to make sure that he is tolerating it well and to make adjustments to his regimen as needed.

## 2024-07-06 NOTE — Assessment & Plan Note (Signed)
 Pressure actually looks really great today.  Continue current regimen.

## 2024-07-06 NOTE — Progress Notes (Signed)
 Established Patient Office Visit  Subjective  Patient ID: Gabriel Juarez, male    DOB: September 15, 1971  Age: 53 y.o. MRN: [id number]  Chief Complaint  Patient presents with   mood    HPI He is here today to discuss his mood.  He says for a while now he has really been struggling with feeling down and with feeling anxious.  He says he reports feeling down and depressed more than half the days.  He feels like his sleep is disrupted he has been try to wear his CPAP consistently.  Also difficulty with concentrating thoughts of being better off dead.  He rates his depressive symptoms as extremely difficult.  He also reports feeling nervous and anxious and on edge nearly every day as well as feeling afraid something awful might happen and being easily annoyed.  He rates his anxiety symptoms is also extremely difficult.  He says he is interested in what ever it takes to help him get better.  He did take Paxil  years ago but it actually made him feel more anxious.  And he did do therapy probably about 7 years ago.  He states he has felt so stressed at times that he has been having headaches he feels like his blood pressure is really high but it is not.  He says he is also even last Friday night started having some lower sternal pain.  He said he thought maybe it was almost like a gas bubble he tried taking some over-the-counter antacids and it did not really help he tried drinking some ginger ale to try to belch and could not really get relief he says is better than it was but he still feels a little bit of discomfort.  He had a CBC, CMP and lipid panel done in June    ROS    Objective:     BP 117/71   Pulse (!) 102   Ht 5' 7 (1.702 m)   Wt 181 lb 0.6 oz (82.1 kg)   SpO2 98%   BMI 28.35 kg/m    Physical Exam Vitals and nursing note reviewed.  Constitutional:      Appearance: Normal appearance.  HENT:     Head: Normocephalic and atraumatic.  Eyes:     Conjunctiva/sclera: Conjunctivae  normal.  Cardiovascular:     Rate and Rhythm: Normal rate and regular rhythm.  Pulmonary:     Effort: Pulmonary effort is normal.     Breath sounds: Normal breath sounds.  Skin:    General: Skin is warm and dry.  Neurological:     Mental Status: He is alert.  Psychiatric:        Mood and Affect: Mood normal.      No results found for any visits on 07/06/24.    The 10-year ASCVD risk score (Arnett DK, et al., 2019) is: 7.8%    Assessment & Plan:   Problem List Items Addressed This Visit       Cardiovascular and Mediastinum   Essential hypertension, benign   Pressure actually looks really great today.  Continue current regimen.        Other   Depression, recurrent (HCC) - Primary   He did not really want to go into details in particular he just said he has a lot of different stressors to the point where he came in today because he felt like he needed to do something about it.  PHQ-9 score of 17 GAD-7 score of 19.  Discussed options  he is open to trying a medication and open to working with a therapist or counseling.  He is okay with starting sertraline  we discussed the medication and potential side effects and how long it takes for the medication to work see him back in about 3 to 4 weeks to make sure that he is tolerating it well and to make adjustments to his regimen as needed.      Relevant Medications   sertraline  (ZOLOFT ) 25 MG tablet   Other Relevant Orders   Ambulatory referral to Behavioral Health   Anxiety   Relevant Medications   sertraline  (ZOLOFT ) 25 MG tablet   Other Relevant Orders   Ambulatory referral to Behavioral Health   Other Visit Diagnoses       Atypical chest pain       Relevant Orders   EKG 12-Lead   Ambulatory referral to Cardiology      Atypical chest pain-sounds more GERD related.  Not worse with activity or relieved with rest.  Because he is over 50 I did decide to go ahead and do an EKG on him today.  EKG shows rate of 88 bpm,  normal sinus rhythm with possible left atrial enlargement, borderline left ventricular hypertrophy.  There was a shift downward in L3 compared to positive R wave on previous EKG from 2015.  No acute ST-T wave changes today.  I would like to refer him to cardiology for further evaluation just based on his EKG findings.  Return in about 4 weeks (around 08/03/2024) for Mood.    Dorothyann Byars, MD

## 2024-07-07 ENCOUNTER — Encounter: Payer: Self-pay | Admitting: Family Medicine

## 2024-07-07 ENCOUNTER — Ambulatory Visit: Admitting: Family Medicine

## 2024-07-08 ENCOUNTER — Other Ambulatory Visit: Payer: Self-pay | Admitting: Family Medicine

## 2024-07-08 ENCOUNTER — Ambulatory Visit: Admitting: Sports Medicine

## 2024-07-08 MED ORDER — SERTRALINE HCL 50 MG PO TABS: ORAL_TABLET | ORAL | 1 refills | Status: DC

## 2024-07-08 NOTE — Telephone Encounter (Signed)
 Yes taht is correct.

## 2024-07-08 NOTE — Telephone Encounter (Signed)
 Pharmacist had received a separate prescription for zoloft  25mg  - he is now d/c that and filling the 50mg  that was sent to the pharmacy today.

## 2024-07-10 ENCOUNTER — Ambulatory Visit: Admitting: Sports Medicine

## 2024-07-13 ENCOUNTER — Ambulatory Visit (INDEPENDENT_AMBULATORY_CARE_PROVIDER_SITE_OTHER): Admitting: Sports Medicine

## 2024-07-13 ENCOUNTER — Encounter: Payer: Self-pay | Admitting: Sports Medicine

## 2024-07-13 DIAGNOSIS — M5030 Other cervical disc degeneration, unspecified cervical region: Secondary | ICD-10-CM | POA: Diagnosis not present

## 2024-07-13 NOTE — Progress Notes (Signed)
    Procedures performed today:    None.  Independent interpretation of notes and tests performed by another provider:   None.  Brief History, Exam, Impression, and Recommendations:    DDD (degenerative disc disease), cervical This is a very pleasant 53 year old male with known cervical DDD, negative nerve conduction studies and his last epidural was in 2022. He self discontinued his Lyrica  and Celebrex  and the pain recurred, at the last visit we did a 5-day prednisone  burst, restarted Lyrica , added formal PT, he has done really well, pain is almost completely gone. He is currently doing Lyrica  only about 1-2 times a day. I do not think he needs another epidural however if he does he would prefer Dr. Medford Necessary do it. For now he can graduate to a home exercise program return to see me as needed.    ____________________________________________ Debby PARAS. Curtis, M.D., ABFM., CAQSM., AME. Primary Care and Sports Medicine [facility]  Adjunct Professor of [facility] Medicine  [employer]  Restaurant manager, fast food

## 2024-07-13 NOTE — Assessment & Plan Note (Signed)
 This is a very pleasant 53 year old male with known cervical DDD, negative nerve conduction studies and his last epidural was in 2022. He self discontinued his Lyrica  and Celebrex  and the pain recurred, at the last visit we did a 5-day prednisone  burst, restarted Lyrica , added formal PT, he has done really well, pain is almost completely gone. He is currently doing Lyrica  only about 1-2 times a day. I do not think he needs another epidural however if he does he would prefer Dr. Medford Necessary do it. For now he can graduate to a home exercise program return to see me as needed.

## 2024-08-02 ENCOUNTER — Other Ambulatory Visit: Payer: Self-pay | Admitting: Family Medicine

## 2024-08-03 ENCOUNTER — Encounter: Payer: Self-pay | Admitting: Family Medicine

## 2024-08-03 ENCOUNTER — Ambulatory Visit (INDEPENDENT_AMBULATORY_CARE_PROVIDER_SITE_OTHER): Admitting: Family Medicine

## 2024-08-03 VITALS — BP 135/76 | HR 74 | Ht 67.0 in | Wt 177.1 lb

## 2024-08-03 DIAGNOSIS — F339 Major depressive disorder, recurrent, unspecified: Secondary | ICD-10-CM | POA: Diagnosis not present

## 2024-08-03 DIAGNOSIS — F419 Anxiety disorder, unspecified: Secondary | ICD-10-CM

## 2024-08-03 MED ORDER — SERTRALINE HCL 100 MG PO TABS: 100.0000 mg | ORAL_TABLET | Freq: Every day | ORAL | 0 refills | Status: DC

## 2024-08-03 NOTE — Progress Notes (Signed)
   Established Patient Office Visit  Subjective  Patient ID: Gabriel Juarez, male    DOB: 1970/12/26  Age: 53 y.o. MRN: [id number]  Chief Complaint  Patient presents with   mood    HPI  He reports that he is doing well.  So far he has been tolerating the medication well without any side effects.  He does feel like it is made a little bit easier to let some things go but other things are still sort of ruminating in his brain he still reports a little bit of irritability.    ROS    Objective:     BP 135/76   Pulse 74   Ht 5' 7 (1.702 m)   Wt 177 lb 1.9 oz (80.3 kg)   SpO2 97%   BMI 27.74 kg/m    Physical Exam Vitals and nursing note reviewed.  Constitutional:      Appearance: Normal appearance.  HENT:     Head: Normocephalic and atraumatic.  Eyes:     Conjunctiva/sclera: Conjunctivae normal.  Cardiovascular:     Rate and Rhythm: Normal rate and regular rhythm.  Pulmonary:     Effort: Pulmonary effort is normal.     Breath sounds: Normal breath sounds.  Skin:    General: Skin is warm and dry.  Neurological:     Mental Status: He is alert.  Psychiatric:        Mood and Affect: Mood normal.      No results found for any visits on 08/03/24.    The 10-year ASCVD risk score (Arnett DK, et al., 2019) is: 10.2%    Assessment & Plan:   Problem List Items Addressed This Visit       Other   Depression, recurrent (HCC) - Primary   Continue sertraline .  We discussed options including going up on his dose.  We can see if this is just a little bit more effective.  PHQ-9 score came down from 17-10.  GAD-7 score came down from 19-12 so some really good improvement.  Think he would benefit from going up to 100 mg.  He can increase to 1-1/2 tabs which would be 75 mg for a week and then start the 100s.  Plan to follow-up in about 2 to 3 months to make sure that he is doing well.  Reach out sooner if having any side effects or concerns.      Relevant Medications    sertraline  (ZOLOFT ) 100 MG tablet   Anxiety   Relevant Medications   sertraline  (ZOLOFT ) 100 MG tablet    Return in about 3 months (around 11/03/2024) for Mood.    Dorothyann Byars, MD

## 2024-08-03 NOTE — Assessment & Plan Note (Signed)
 Continue sertraline .  We discussed options including going up on his dose.  We can see if this is just a little bit more effective.  PHQ-9 score came down from 17-10.  GAD-7 score came down from 19-12 so some really good improvement.  Think he would benefit from going up to 100 mg.  He can increase to 1-1/2 tabs which would be 75 mg for a week and then start the 100s.  Plan to follow-up in about 2 to 3 months to make sure that he is doing well.  Reach out sooner if having any side effects or concerns.

## 2024-08-03 NOTE — Progress Notes (Signed)
 Pt reports that he is doing well on Sertraline  50 mg

## 2024-08-10 ENCOUNTER — Ambulatory Visit: Admitting: Family Medicine

## 2024-08-18 ENCOUNTER — Encounter: Payer: Self-pay | Admitting: Sports Medicine

## 2024-09-25 ENCOUNTER — Encounter: Payer: Self-pay | Admitting: Family Medicine

## 2024-09-30 ENCOUNTER — Ambulatory Visit: Admitting: Clinical

## 2024-10-07 ENCOUNTER — Other Ambulatory Visit: Payer: Self-pay | Admitting: Medical Genetics

## 2024-10-07 DIAGNOSIS — Z006 Encounter for examination for normal comparison and control in clinical research program: Secondary | ICD-10-CM

## 2024-10-09 ENCOUNTER — Telehealth: Payer: Self-pay | Admitting: Clinical

## 2024-10-09 ENCOUNTER — Ambulatory Visit (INDEPENDENT_AMBULATORY_CARE_PROVIDER_SITE_OTHER): Admitting: Clinical

## 2024-10-09 DIAGNOSIS — F331 Major depressive disorder, recurrent, moderate: Secondary | ICD-10-CM

## 2024-10-09 NOTE — Telephone Encounter (Signed)
 TC to patient to offer earlier appointment for today 10/09/24 at 11:30am due to a cancellation & available slot.  No answer. This Clinician left a message to call back with name & contact information.  Also left reminder about appt for 10/28/24.

## 2024-10-09 NOTE — Progress Notes (Signed)
 [facility] Counselor Adult Comprehensive Clinical Assessment - TELEMEDICINE   Name: Gabriel Juarez Date: 10/09/2024 MRN: [id number] DOB: 10-Dec-1971 PCP: Alvan Dorothyann BIRCH, MD  Session Time start: 11:32am End time: 12:30pm Total time: 58 min  Client/Family location:  Therapist location: [facility] R[facility] All persons participating in visit: Client & this therapist  I connected with client and/or family via Video Enabled Telemedicine Application  (Video is Caregility application) and verified that I am speaking with the correct person using two identifiers. Discussed confidentiality: Yes   I discussed the limitations of telemedicine and the availability of in person appointments.  Discussed there is a possibility of technology failure and discussed alternative modes of communication if that failure occurs.  I discussed that engaging in this telemedicine visit, they consent to the provision of behavioral healthcare and the services will be billed under their insurance.  Patient and/or legal guardian expressed understanding and consented to Telemedicine visit: Yes   Types of Service: Comprehensive Clinical Assessment (CCA) and Video visit  Guardian/Payee:  Self    Paperwork requested: No   Reason for referral in patient/family's own words:  Wants to learn additional coping strategies to manage increased stress and depressive symptoms  Client likes to be called Gabriel Juarez.  SUBJECTIVE:  Standardized Assessments completed: GAD-7 and PHQ 9   10/09/2024  GAD 7 : Generalized Anxiety Score   Nervous, Anxious, on Edge 1   Control/stop worrying 3   Worry too much - different things 3   Trouble relaxing 3   Restless 2   Easily annoyed or irritable 2   Afraid - awful might happen 2   Total GAD 7 Score 16   Anxiety Difficulty Somewhat difficult     10/09/2024  PHQ-9 Depression Screening Tool   Decreased Interest 2   Down,  Depressed, Hopeless 3   Altered sleeping 3   Tired, decreased energy 1   Change in appetite 3   Feeling bad or failure about yourself 3   Trouble concentrating 1   Moving slowly or fidgety/restless 1   Thoughts of being better off dead 1   PHQ-9 Score 18     Risk Assessment: Denied any current SI, intent or plan. Denied any previous attempts to hurt or kill himself. Danger to Self:  No Self-injurious Behavior: No Danger to Others: No Duty to Warn:no Physical Aggression / Violence:No  Access to Firearms a concern: No   Client and/or legal guardian was educated about steps to take if suicide or homicide risk level increases between visits: n/a While future psychiatric events cannot be accurately predicted, the patient does not currently require acute inpatient psychiatric care and does not currently meet [place]  involuntary commitment criteria.  Current Concerns or Stressors:  Family conflict, Finances, and Job loss/unemployment Limited support system  Client and/or Family's Strengths/Protective Factors: Hopefulness and Able to Communicate Effectively Try to see the good in everything, power through it  He values his children - making sure they have what they need   Current Health Habits: Sleep:   Sleep apnea (CPAP) - 4 hours of sleep for the last 10 years, mind never shuts down, lay down 8pm/9pm by midnight- fall asleep quickly, sleep hard, up at 12/1am and just awake - thinking about problems - no sleeping walking  - TV off  Sleep routine: Dinner at 6pm  7pm/8pm - Sit and read with son Falls asleep quickly, within 30 min, wakes up 12am/1am Takes off the  CPAP around 12am, ordered it around Covid and just got it this summer When he wakes up, he stays in bed until 3am/4am He has a lot of energy for 1-2 hours, then he feels like dragging after that He does not take naps during the day Helps son ready for school 7am Pick up son around 2:30pm Cooks  dinner  Physical Activities: Rides bike - 2-3x/ month, walking or playing golf (haven't played in last year)  Eating/Appetite: Stress eating in the past 2 months, gained 20 lbs in 6 months Skips breakfast, eat lunch & dinner Water - 8 bottles of water  Current Medications and therapies:  Psychotropic medications: Current medications: Sertraline  100 mg Client taking medications as prescribed: Yes and takes it in the Morning Side effects reported: None reported  Therapies:  None  Psychiatric Review of systems: Insomnia: 2 x a month - doesn't sleep throughout the night,  Changes in appetite: Increased in eating for the past 2 months Decreased need for sleep: No Hallucinations: No   Paranoia: No    Psychiatric History: Past psychiatry diagnosis: Depression Patient currently being seen by therapist/psychiatrist:  No Prior Suicide Attempts: No Past psychiatry Hospitalization(s): No Past history of violence: No  Social History:  Living situation: Lives with girlfriend & 35 yo son Relationship status: Partner Number of Children if applicable: 2 (Currently doesn't have a relationship with his 53 yo who lives in [place] - About a year ago, his daughter told him she doesn't want a relationship with him anymore)  Employment: Unemployed for over a year and 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 Yes tea Have you recently consumed alcohol? No, stopped a year ago Have you recently used any drugs, eg marijuana, other substances or prescriptions drugs not prescribed to them?  no  Have you recently consumed any tobacco or nicotine? no Does client seem concerned about dependence or abuse of any  substance? no  Traumatic Experiences/Abuse history: Have you ever been exposed, witnessed or experienced any form of abuse, what type?  No  Have you ever been exposed, witnessed or experienced something traumatic, describe?  No   Family history: Family mental illness:  No information Family history of bipolar disorder: No Family school achievement history:  No information  Family History:  Family History  Problem Relation Age of Onset   Multiple sclerosis Mother    Multiple sclerosis Brother    Hypertension Father    Healthy Sister    Healthy Sister    Colon cancer Neg Hx    Colon polyps Neg Hx    Esophageal cancer Neg Hx    Rectal cancer Neg Hx    Stomach cancer Neg Hx      Client Medical history  Medical History/Surgical History: reviewed' Past Medical History:  Diagnosis Date   Arthritis 2022   Left knee   Hypertension    Sleep apnea     Past Surgical History:  Procedure Laterality Date   NO PAST SURGERIES      Medications: Current Outpatient Medications  Medication Sig Dispense Refill   AMBULATORY NON FORMULARY MEDICATION Medication Name: CPAP set to 5-15 cm water pressure.  AHI was 13.1. 1 Units 0   atorvastatin  (LIPITOR) 10 MG tablet Take 1 tablet (  10 mg total) by mouth daily. 90 tablet 3   celecoxib  (CELEBREX ) 200 MG capsule One to 2 tablets by mouth daily as needed for pain. 60 capsule 2   lisinopril  (ZESTRIL ) 20 MG tablet Take 1 tablet (20 mg total) by mouth daily. 90 tablet 3   pregabalin  (LYRICA ) 75 MG capsule 1 capsule p.o. nightly for a week then twice daily for a week then 3 times daily if needed 60 capsule 3   sertraline  (ZOLOFT ) 100 MG tablet Take 1 tablet (100 mg total) by mouth daily. 90 tablet 0   No current facility-administered medications for this visit.    No Known Allergies   Interventions: Interventions utilized: This Clinician reviewed information on new patient paperwork, explained services, identified presenting concerns,  explored goals and built rapport. Obtained additional information for comprehensive assessment and developed general plan of care. Psychoeducation and/or Health Education   Client Response:  Gabriel Juarez reported increased anxiety and depression due to financial stressors and not having a relationship with his daughter.  Gabriel Juarez has difficulties with sleep and managing the effects of his limited finances.  He is open to therapy and implementing strategies to improve his mood and manage his stress level.  Gabriel Juarez thought that he can ride a bike or walk once a week for about 1 hour.  He reported he will start on Monday.  He will continue to play outside with his son as well.   Mental status exam:   General Appearance Gabriel Juarez:  Casual Eye Contact:  Good Motor Behavior:  Normal Speech:  Normal Level of Consciousness:  Alert Mood:  Anxious and Depressed Affect:  Appropriate Anxiety Level:  Severe Thought Process:  Coherent Thought Content:  WNL Perception:  Normal Judgment:  Good Insight:  Present   Clinical Assessment: Gabriel Juarez is a 53 yo male who presents with severe anxiety & depressive symptoms.  He's had difficulties with sleep for the last 10 years, averaging about 4 hours a night. This is probably contributing to his anxiety & depressive symptoms.  Diagnosis: Major depressive disorder, recurrent episode, moderate (HCC)  Coordination of Care: Written progress or summary reports in medical chart   Recommendations for Services/Supports/Treatments: Individual psycho therapy Increased physical activities outside and pleasant activities. Improve sleep quality - start sleep diary  Schedule follow ups on  11/11 10am 11/25 10am 12/9 10am 12/30 10am  This clinician will send strategies via My Chart for depression & anxiety  Follow up Plan: A follow-up was scheduled to create a more specific treatment plan and begin treatment. Therapist answered all questions  during the evaluation and contact information was provided.   Lionel Woodberry P. Trudy, MSW, LCSW [employer] Therapist Main Office: [phone]

## 2024-10-10 ENCOUNTER — Encounter: Payer: Self-pay | Admitting: Family Medicine

## 2024-10-11 ENCOUNTER — Encounter: Payer: Self-pay | Admitting: Clinical

## 2024-10-13 MED ORDER — SILDENAFIL CITRATE 100 MG PO TABS: 50.0000 mg | ORAL_TABLET | Freq: Every day | ORAL | 11 refills | Status: AC | PRN

## 2024-10-13 NOTE — Addendum Note (Signed)
 Addended by: Seydou Hearns D on: 10/13/2024 07:22 AM   Modules accepted: Orders

## 2024-10-27 ENCOUNTER — Ambulatory Visit: Admitting: Clinical

## 2024-10-28 ENCOUNTER — Ambulatory Visit: Admitting: Clinical

## 2024-11-03 ENCOUNTER — Ambulatory Visit: Admitting: Family Medicine

## 2024-11-03 LAB — GENECONNECT MOLECULAR SCREEN: Genetic Analysis Overall Interpretation: NEGATIVE

## 2024-11-03 NOTE — Progress Notes (Deleted)
   Established Patient Office Visit  Patient ID: Gabriel Juarez, male    DOB: Feb 17, 1971  Age: 53 y.o. MRN: [id number] PCP: Alvan Dorothyann BIRCH, MD  No chief complaint on file.   Subjective:     HPI  Discussed the use of AI scribe software for clinical note transcription with the patient, who gave verbal consent to proceed.  History of Present Illness    {History (Optional):23778}  ROS    Objective:     There were no vitals taken for this visit. {Vitals History (Optional):23777}  Physical Exam  {PhysExam Abridge (Optional):210964309} No results found for any visits on 11/03/24.  {Labs (Optional):23779}  The 10-year ASCVD risk score (Arnett DK, et al., 2019) is: 10.2%    Assessment & Plan:   Problem List Items Addressed This Visit       Other   MDD (major depressive disorder), recurrent episode, moderate (HCC) - Primary    Assessment and Plan Assessment & Plan     No follow-ups on file.    Dorothyann Alvan, MD [facility] at [facility]

## 2024-11-10 ENCOUNTER — Ambulatory Visit (INDEPENDENT_AMBULATORY_CARE_PROVIDER_SITE_OTHER): Admitting: Clinical

## 2024-11-10 DIAGNOSIS — F331 Major depressive disorder, recurrent, moderate: Secondary | ICD-10-CM | POA: Diagnosis not present

## 2024-11-10 NOTE — Progress Notes (Signed)
 [facility] Counselor Progress Note - TELEMEDICINE VISIT  Patient ID: Gabriel Juarez, MRN: [id number]    Date: 11/10/24  Time Spent: 10am   - 10:50am  : 50 Minutes  Types of Service: Individual psychotherapy and Video visit  Client and/or Legal Guardian location: Client's home in [place], [place] Therapist location: Remote work - [place], [place] All persons participating in visit: Client & this therapist  I connected with client and/or legal guardian via Video Enabled Telemedicine Application  (Video is Caregility application) and verified that I am speaking with the correct person using two identifiers. Discussed confidentiality: Yes   I discussed the limitations of telemedicine and the availability of in person appointments.  Discussed there is a possibility of technology failure and discussed alternative modes of communication if that failure occurs.  I discussed that engaging in this telemedicine visit, they consent to the provision of behavioral healthcare and the services will be billed under their insurance.  Client and/or legal guardian expressed understanding and consented to Telemedicine visit: Yes    Presenting Concerns:  - Increased anxiety & depressive symptoms   Mental Status Exam: Appearance:  Casual and Neat     Behavior: Appropriate  Motor: Normal  Speech/Language:  Normal Rate  Affect: Appropriate  Mood: anxious and depressed  Thought process: normal  Thought content:   WNL  Sensory/Perceptual disturbances:   WNL  Orientation: oriented to person, place, time/date, situation, and day of week  Attention: Good  Concentration: Good  Memory: WNL  Fund of knowledge:  Good  Insight:   Good  Judgment:  Good  Impulse Control: Good   Risk Assessment: Danger to Self:  No Self-injurious Behavior: No Danger to Others: No Duty to Warn:no   Subjective:  Difficulties with finding a job Concerns about his relationship with his daughter    Interventions: Cognitive Behavioral Therapy - Introduction to cognitive coping strategies and applying it to his situations - Writing down his thoughts before bedtime  Client Response: Gabriel Juarez reported his sleep is worse - only about 2-3 hours of sleep each night He has been riding his bike every other day, about 1 hour each time. He has been taking the sertraline  100 mg with no reported side effects.  Gabriel Juarez was open to enhancing his cognitive coping skills and implementing strategies that may improve his ability to sleep better.  He shared his thoughts & feelings about his difficulties with not obtaining a job even though he's had several interviews. He had an offer that was rescinded recently.  He also shared thoughts & feelings about his relationship with his daughter.   Diagnosis:  Major depressive disorder, recurrent episode, moderate (HCC)   Goals, Assessment & Plan:   Gabriel Juarez continues to experience various stressors that's affecting his mood and ability to sleep.  He actively engaged in the visit and willing to try other strategies.  Gabriel Juarez will continue to ride his bicycle for physical activities and improve mood.  He will also try to write down this thoughts and feelings in a notebook before bedtime.  This Clinician will send info from [employer] podcast about [name] [facility] [place].  Treatment Level 2-3 times a month  Modality - TeleMedicine - Video  Treatment Plan - See separate note for details of treatment plan.  Next appointment is 11/24/24.   Symphoni Helbling P. Pouch, MSW, LCSW [employer] Therapist Main Office: [phone]

## 2024-11-13 ENCOUNTER — Encounter: Payer: Self-pay | Admitting: Clinical

## 2024-11-13 NOTE — Progress Notes (Signed)
 Behavioral Health Treatment Plan   Name:Gabriel Juarez  Insurance: [facility] [employer] [id number] P   MRN: [id number]   Treatment Plan Development Date: 11/10/24   Strengths:  Hopefulness and Able to Communicate Effectively Try to see the good in everything, power through it   He values his children - making sure they have what they need  Supports: Significant Other   Client Statement of Needs: Wants to manage his anxiety & depression, get a job and sleep better   Treatment Level: Outpatient Individual Psycho therapy  Client Treatment Preferences:TeleMedicine   Diagnosis Depressive Disorders  Major depressive disorder, recurrent episode, moderate (HCC)   Symptoms:  Depressed mood-indicated by subjective report or observation by others (in children and adolescents, can be irritable mood). and Sleep disturbance (insomnia or hypersomnia).  Goals:  Alleviate depressive symptoms to return to effective functioning.  Objectives: Target Date For All Objectives: 09/16/2025  Describe experiences with depression including impact on functioning and attempts to resolve. and Identify and replace thoughts and beliefs that support depression.  Progress Documentation:  Progressing  Interventions:  Cognitive Behavioral Therapy and Psycho-education/Bibliotherapy   Expected duration of treatment: 6-12 months  Party responsible for implementation of interventions: This Clinician, J. Antha Niday, LCSW.   This plan has been reviewed and created by the following participants: Gabriel Juarez & this Clinician.   A new plan will be created at least every 12 months.  The patient fully participated in the development of treatment plan with the clinician and verbally consents to such treatment.   Patient Treatment Plan Signature Obtained: No, pending signature via MyChart.   Addisynn Vassell SHAUNNA Pouch, LCSW

## 2024-11-14 ENCOUNTER — Encounter: Payer: Self-pay | Admitting: Family Medicine

## 2024-11-14 ENCOUNTER — Other Ambulatory Visit: Payer: Self-pay | Admitting: Family Medicine

## 2024-11-14 DIAGNOSIS — F419 Anxiety disorder, unspecified: Secondary | ICD-10-CM

## 2024-11-14 DIAGNOSIS — F339 Major depressive disorder, recurrent, unspecified: Secondary | ICD-10-CM

## 2024-11-16 ENCOUNTER — Encounter: Payer: Self-pay | Admitting: Family Medicine

## 2024-11-16 DIAGNOSIS — M5030 Other cervical disc degeneration, unspecified cervical region: Secondary | ICD-10-CM

## 2024-11-16 MED ORDER — PREDNISONE 20 MG PO TABS: 40.0000 mg | ORAL_TABLET | Freq: Every day | ORAL | 0 refills | Status: AC

## 2024-11-16 MED ORDER — CELECOXIB 200 MG PO CAPS: ORAL_CAPSULE | ORAL | 1 refills | Status: AC

## 2024-11-16 MED ORDER — SERTRALINE HCL 100 MG PO TABS: 100.0000 mg | ORAL_TABLET | Freq: Every day | ORAL | 0 refills | Status: AC

## 2024-11-16 MED ORDER — PREGABALIN 75 MG PO CAPS: ORAL_CAPSULE | ORAL | 1 refills | Status: AC

## 2024-11-19 ENCOUNTER — Ambulatory Visit: Admitting: Family Medicine

## 2024-11-19 ENCOUNTER — Encounter: Payer: Self-pay | Admitting: Family Medicine

## 2024-11-19 VITALS — BP 119/78 | HR 79 | Ht 67.0 in | Wt 176.0 lb

## 2024-11-19 DIAGNOSIS — K648 Other hemorrhoids: Secondary | ICD-10-CM

## 2024-11-19 DIAGNOSIS — M5030 Other cervical disc degeneration, unspecified cervical region: Secondary | ICD-10-CM

## 2024-11-19 DIAGNOSIS — I10 Essential (primary) hypertension: Secondary | ICD-10-CM

## 2024-11-19 DIAGNOSIS — R9431 Abnormal electrocardiogram [ECG] [EKG]: Secondary | ICD-10-CM

## 2024-11-19 DIAGNOSIS — E785 Hyperlipidemia, unspecified: Secondary | ICD-10-CM | POA: Insufficient documentation

## 2024-11-19 DIAGNOSIS — F331 Major depressive disorder, recurrent, moderate: Secondary | ICD-10-CM

## 2024-11-19 MED ORDER — HYDROCORTISONE (PERIANAL) 2.5 % EX CREA: 1.0000 | TOPICAL_CREAM | Freq: Two times a day (BID) | CUTANEOUS | 0 refills | Status: AC | PRN

## 2024-11-19 NOTE — Assessment & Plan Note (Addendum)
 Inc zoloft  to 125mg  daily  Depression Managed with sertraline  100 mg. Slight improvement noted. Open to dose adjustment for optimal effect. - Increase sertraline  to 125 mg by splitting 50 mg tablets and adding to current 100 mg dose. - Monitor response to increased dose for one month. - Consider increasing to 150 mg if 125 mg is insufficient.

## 2024-11-19 NOTE — Assessment & Plan Note (Signed)
 Well controlled. Continue current regimen. Follow up in  6 mo

## 2024-11-19 NOTE — Addendum Note (Signed)
 Addended by: Howard Patton D on: 11/19/2024 06:00 PM   Modules accepted: Orders

## 2024-11-19 NOTE — Assessment & Plan Note (Signed)
 Hemorrhoids External hemorrhoids with irritation and occasional bleeding. Previous suppositories ineffective. - Prescribed alternative treatment for hemorrhoids. - Advised on stool softeners to prevent straining.

## 2024-11-19 NOTE — Assessment & Plan Note (Addendum)
 Recently refilled celebrex  and Lyrica  as previously written by sports med. ON prednisone  right now and is helping some.  Cervical disc degeneration Chronic cervical disc degeneration. Prednisone  provides some relief. Considering surgical intervention due to chronicity and impact on quality of life. - Continue prednisone  as prescribed. - Use Lyrica  and Celebrex  as prescribed. - Encouraged continuation of physical therapy exercises and stretches. - Consider referral to orthopedic specialist if symptoms persist.

## 2024-11-19 NOTE — Assessment & Plan Note (Signed)
 Hyperlipidemia Managed with atorvastatin . Recent cardiovascular risk score supports statin therapy. Plan to recheck lipid levels and liver enzymes. - Ordered lipid panel to assess response to atorvastatin . - Ordered liver function tests to monitor for potential side effects of atorvastatin .

## 2024-11-19 NOTE — Progress Notes (Addendum)
 Established Patient Office Visit  Patient ID: Gabriel Juarez, male    DOB: [birth year]  Age: 53 y.o. MRN: [id number] PCP: Gabriel Gabriel BIRCH, MD  Chief Complaint  Patient presents with   Hemorrhoids    Pt was prescribed something from Gabriel Juarez that did not seem to help is wondering what the next step would be.   Follow-up    Depression - sertraline  was increased to 100 mg patient states he is doing very well with this change.    Subjective:     HPI  Discussed the use of AI scribe software for clinical note transcription with the patient, who gave verbal consent to proceed.  History of Present Illness Gabriel Juarez is a 53 year old male with depression, hemorrhoids, chronic pain, and elevated cholesterol who presents for follow-up on medication management and symptom control.  Depressive symptoms - On sertraline  for over three months, recently increased to 100 mg daily - Partial relief of depressive symptoms with current regimen  Hemorrhoidal disease - Ongoing external hemorrhoids causing irritation and occasional bleeding - Previous treatment with suppositories was ineffective - Manages symptoms by maintaining soft stools to avoid straining  Chronic musculoskeletal pain - Chronic upper body pain with stiffness, tightness, and pain radiating to the wrist - Hand cramps associated with pain - Symptoms worsen with weather changes - Current medications include prednisone  (provides some relief), Lyrica , and Celebrex  - History of epidural injections without benefit - Symptoms present for over a decade  Hyperlipidemia and cardiovascular risk - Recent blood work revealed elevated cholesterol - Currently taking atorvastatin  - Awaiting follow-up on previous EKG - No recent chest pain     11/19/2024    8:26 AM 10/10/2024   11:17 PM 08/03/2024   10:01 AM  PHQ9 SCORE ONLY  PHQ-9 Total Score 16  13      Information is confidential and restricted. Go to Review Flowsheets  to unlock data.   Data saved with a previous flowsheet row definition       ROS    Objective:     BP 119/78 (BP Location: Left Arm, Patient Position: Sitting, Cuff Size: Normal)   Pulse 79   Ht 5' 7 (1.702 m)   Wt 176 lb (79.8 kg)   SpO2 97%   BMI 27.57 kg/m    Physical Exam Vitals and nursing note reviewed.  Constitutional:      Appearance: Normal appearance.  HENT:     Head: Normocephalic and atraumatic.  Eyes:     Conjunctiva/sclera: Conjunctivae normal.  Cardiovascular:     Rate and Rhythm: Normal rate and regular rhythm.  Pulmonary:     Effort: Pulmonary effort is normal.     Breath sounds: Normal breath sounds.  Skin:    General: Skin is warm and dry.  Neurological:     Mental Status: He is alert.  Psychiatric:        Mood and Affect: Mood normal.      No results found for any visits on 11/19/24.    The 10-year ASCVD risk score (Arnett DK, et al., 2019) is: 8.1%    Assessment & Plan:   Problem List Items Addressed This Visit       Cardiovascular and Mediastinum   Internal hemorrhoid, bleeding - Primary   Hemorrhoids External hemorrhoids with irritation and occasional bleeding. Previous suppositories ineffective. - Prescribed alternative treatment for hemorrhoids. - Advised on stool softeners to prevent straining.       Relevant Medications   hydrocortisone  (  PROCTO-MED HC ) 2.5 % rectal cream   Essential hypertension, benign   Well controlled. Continue current regimen. Follow up in  6 mo       Relevant Orders   CMP14+EGFR   Lipid panel     Musculoskeletal and Integument   DDD (degenerative disc disease), cervical   Recently refilled celebrex  and Lyrica  as previously written by sports med. ON prednisone  right now and is helping some.  Cervical disc degeneration Chronic cervical disc degeneration. Prednisone  provides some relief. Considering surgical intervention due to chronicity and impact on quality of life. - Continue prednisone   as prescribed. - Use Lyrica  and Celebrex  as prescribed. - Encouraged continuation of physical therapy exercises and stretches. - Consider referral to orthopedic specialist if symptoms persist.        Other   MDD (major depressive disorder), recurrent episode, moderate (HCC)   Inc zoloft  to 125mg  daily  Depression Managed with sertraline  100 mg. Slight improvement noted. Open to dose adjustment for optimal effect. - Increase sertraline  to 125 mg by splitting 50 mg tablets and adding to current 100 mg dose. - Monitor response to increased dose for one month. - Consider increasing to 150 mg if 125 mg is insufficient.      Hyperlipidemia LDL goal <100   Hyperlipidemia Managed with atorvastatin . Recent cardiovascular risk score supports statin therapy. Plan to recheck lipid levels and liver enzymes. - Ordered lipid panel to assess response to atorvastatin . - Ordered liver function tests to monitor for potential side effects of atorvastatin .      Relevant Orders   CMP14+EGFR   Lipid panel   Other Visit Diagnoses       Abnormal EKG       Relevant Orders   Ambulatory referral to Cardiology       Assessment and Plan Assessment & Plan  General Health Maintenance Discussion of vaccinations including flu and pneumonia. Pneumonia vaccine recommended for individuals over 50. - Recommended pneumonia vaccine. - Discussed flu vaccine.  He did remind me that we were going to do a cardiology referral at the last office visit he has not heard from their office.  Will place new referral today.  He had some slight change on his EKG.  Currently asymptomatic.  Return in about 6 months (around 05/20/2025) for Hypertension, Mood.    Gabriel Byars, MD [facility] Medicine at [facility]

## 2024-11-19 NOTE — Patient Instructions (Signed)
 Increase zoloft  to 125mg  daily

## 2024-11-20 ENCOUNTER — Ambulatory Visit: Payer: Self-pay | Admitting: Family Medicine

## 2024-11-20 LAB — LIPID PANEL
Chol/HDL Ratio: 1.7 ratio (ref 0.0–5.0)
Cholesterol, Total: 152 mg/dL (ref 100–199)
HDL: 90 mg/dL (ref >39)
LDL Chol Calc (NIH): 53 mg/dL (ref 0–99)
Triglycerides: 35 mg/dL (ref 0–149)
VLDL Cholesterol Cal: 9 mg/dL (ref 5–40)

## 2024-11-20 LAB — CMP14+EGFR
ALT: 33 IU/L (ref 0–44)
AST: 32 IU/L (ref 0–40)
Albumin: 4.6 g/dL (ref 3.8–4.9)
Alkaline Phosphatase: 123 IU/L (ref 47–123)
BUN/Creatinine Ratio: 20 (ref 9–20)
BUN: 22 mg/dL (ref 6–24)
Bilirubin Total: 0.7 mg/dL (ref 0.0–1.2)
CO2: 23 mmol/L (ref 20–29)
Calcium: 9.4 mg/dL (ref 8.7–10.2)
Chloride: 100 mmol/L (ref 96–106)
Creatinine, Ser: 1.08 mg/dL (ref 0.76–1.27)
Globulin, Total: 2.6 g/dL (ref 1.5–4.5)
Glucose: 106 mg/dL — ABNORMAL HIGH (ref 70–99)
Potassium: 4.6 mmol/L (ref 3.5–5.2)
Sodium: 136 mmol/L (ref 134–144)
Total Protein: 7.2 g/dL (ref 6.0–8.5)
eGFR: 82 mL/min/1.73 (ref >59)

## 2024-11-20 NOTE — Progress Notes (Signed)
 Your lab work is within acceptable range and there are no concerning findings.   ?

## 2024-11-24 ENCOUNTER — Ambulatory Visit: Admitting: Clinical

## 2024-12-08 ENCOUNTER — Ambulatory Visit: Admitting: Family Medicine

## 2024-12-15 ENCOUNTER — Ambulatory Visit: Admitting: Clinical

## 2025-02-03 ENCOUNTER — Ambulatory Visit: Admitting: Cardiology

## 2025-05-20 ENCOUNTER — Ambulatory Visit: Admitting: Family Medicine
